# Patient Record
Sex: Female | Born: 1950
Health system: Southern US, Community
[De-identification: ages and names within clinical notes are randomized; demographics above are authoritative.]

## PROBLEM LIST (undated history)

## (undated) DIAGNOSIS — L259 Unspecified contact dermatitis, unspecified cause: Secondary | ICD-10-CM

## (undated) DIAGNOSIS — M1990 Unspecified osteoarthritis, unspecified site: Secondary | ICD-10-CM

## (undated) DIAGNOSIS — M479 Spondylosis, unspecified: Secondary | ICD-10-CM

## (undated) DIAGNOSIS — G589 Mononeuropathy, unspecified: Secondary | ICD-10-CM

## (undated) DIAGNOSIS — E785 Hyperlipidemia, unspecified: Secondary | ICD-10-CM

## (undated) DIAGNOSIS — S149XXA Injury of unspecified nerves of neck, initial encounter: Secondary | ICD-10-CM

## (undated) DIAGNOSIS — Z8639 Personal history of other endocrine, nutritional and metabolic disease: Secondary | ICD-10-CM

## (undated) DIAGNOSIS — Z862 Personal history of diseases of the blood and blood-forming organs and certain disorders involving the immune mechanism: Secondary | ICD-10-CM

## (undated) DIAGNOSIS — A630 Anogenital (venereal) warts: Secondary | ICD-10-CM

## (undated) DIAGNOSIS — I10 Essential (primary) hypertension: Secondary | ICD-10-CM

## (undated) DIAGNOSIS — K219 Gastro-esophageal reflux disease without esophagitis: Secondary | ICD-10-CM

## (undated) DIAGNOSIS — M5030 Other cervical disc degeneration, unspecified cervical region: Secondary | ICD-10-CM

## (undated) DIAGNOSIS — S83429A Sprain of lateral collateral ligament of unspecified knee, initial encounter: Secondary | ICD-10-CM

## (undated) DIAGNOSIS — M5020 Other cervical disc displacement, unspecified cervical region: Secondary | ICD-10-CM

## (undated) HISTORY — DX: Anogenital (venereal) warts: A63.0

## (undated) HISTORY — DX: Personal history of other endocrine, nutritional and metabolic disease: Z86.39

## (undated) HISTORY — DX: Spondylosis, unspecified: M47.9

## (undated) HISTORY — DX: Other cervical disc displacement, unspecified cervical region: M50.20

## (undated) HISTORY — DX: Gastro-esophageal reflux disease without esophagitis: K21.9

## (undated) HISTORY — DX: Other cervical disc degeneration, unspecified cervical region: M50.30

## (undated) HISTORY — DX: Personal history of diseases of the blood and blood-forming organs and certain disorders involving the immune mechanism: Z86.2

## (undated) HISTORY — DX: Essential (primary) hypertension: I10

## (undated) HISTORY — DX: Injury of unspecified nerves of neck, initial encounter: S14.9XXA

## (undated) HISTORY — DX: Personal history of diseases of the blood and blood-forming organs and certain disorders involving the immune mechanism: Z86.39

## (undated) HISTORY — DX: Unspecified contact dermatitis, unspecified cause: L25.9

## (undated) HISTORY — PX: OTHER SURGICAL HISTORY: SHX169

## (undated) HISTORY — DX: Unspecified osteoarthritis, unspecified site: M19.90

## (undated) HISTORY — DX: Hyperlipidemia, unspecified: E78.5

## (undated) HISTORY — DX: Mononeuropathy, unspecified: G58.9

## (undated) HISTORY — DX: Sprain of lateral collateral ligament of unspecified knee, initial encounter: S83.429A

---

## 1990-01-10 HISTORY — PX: CHOLECYSTECTOMY: SHX55

## 1997-12-25 ENCOUNTER — Emergency Department (HOSPITAL_COMMUNITY): Admission: EM | Admit: 1997-12-25 | Discharge: 1997-12-25 | Payer: Self-pay | Admitting: Emergency Medicine

## 1999-01-07 ENCOUNTER — Other Ambulatory Visit: Admission: RE | Admit: 1999-01-07 | Discharge: 1999-01-07 | Payer: Self-pay | Admitting: *Deleted

## 1999-01-09 ENCOUNTER — Inpatient Hospital Stay (HOSPITAL_COMMUNITY): Admission: AD | Admit: 1999-01-09 | Discharge: 1999-01-09 | Payer: Self-pay | Admitting: *Deleted

## 1999-01-11 HISTORY — PX: MOHS SURGERY: SHX181

## 1999-08-05 ENCOUNTER — Emergency Department (HOSPITAL_COMMUNITY): Admission: EM | Admit: 1999-08-05 | Discharge: 1999-08-06 | Payer: Self-pay | Admitting: Emergency Medicine

## 1999-09-22 ENCOUNTER — Encounter: Admission: RE | Admit: 1999-09-22 | Discharge: 1999-09-22 | Payer: Self-pay | Admitting: Internal Medicine

## 1999-09-22 ENCOUNTER — Encounter: Payer: Self-pay | Admitting: Internal Medicine

## 1999-12-22 ENCOUNTER — Encounter (INDEPENDENT_AMBULATORY_CARE_PROVIDER_SITE_OTHER): Payer: Self-pay

## 1999-12-22 ENCOUNTER — Other Ambulatory Visit: Admission: RE | Admit: 1999-12-22 | Discharge: 1999-12-22 | Payer: Self-pay | Admitting: *Deleted

## 2000-01-07 ENCOUNTER — Other Ambulatory Visit: Admission: RE | Admit: 2000-01-07 | Discharge: 2000-01-07 | Payer: Self-pay | Admitting: *Deleted

## 2000-09-26 ENCOUNTER — Encounter: Payer: Self-pay | Admitting: Internal Medicine

## 2000-09-26 ENCOUNTER — Encounter: Admission: RE | Admit: 2000-09-26 | Discharge: 2000-09-26 | Payer: Self-pay | Admitting: Internal Medicine

## 2004-01-11 HISTORY — PX: FOOT FRACTURE SURGERY: SHX645

## 2004-10-28 ENCOUNTER — Ambulatory Visit: Payer: Self-pay | Admitting: Internal Medicine

## 2004-10-28 ENCOUNTER — Ambulatory Visit (HOSPITAL_COMMUNITY): Admission: RE | Admit: 2004-10-28 | Discharge: 2004-10-28 | Payer: Self-pay | Admitting: Internal Medicine

## 2005-06-16 ENCOUNTER — Ambulatory Visit: Payer: Self-pay | Admitting: Family Medicine

## 2005-06-16 ENCOUNTER — Ambulatory Visit: Payer: Self-pay | Admitting: *Deleted

## 2005-06-30 ENCOUNTER — Ambulatory Visit (HOSPITAL_COMMUNITY): Admission: RE | Admit: 2005-06-30 | Discharge: 2005-06-30 | Payer: Self-pay | Admitting: Family Medicine

## 2005-07-15 ENCOUNTER — Ambulatory Visit: Payer: Self-pay | Admitting: Family Medicine

## 2005-07-15 ENCOUNTER — Encounter (INDEPENDENT_AMBULATORY_CARE_PROVIDER_SITE_OTHER): Payer: Self-pay | Admitting: Family Medicine

## 2005-07-19 ENCOUNTER — Ambulatory Visit (HOSPITAL_COMMUNITY): Admission: RE | Admit: 2005-07-19 | Discharge: 2005-07-19 | Payer: Self-pay | Admitting: Family Medicine

## 2005-09-01 ENCOUNTER — Emergency Department (HOSPITAL_COMMUNITY): Admission: EM | Admit: 2005-09-01 | Discharge: 2005-09-01 | Payer: Self-pay | Admitting: Emergency Medicine

## 2005-09-20 ENCOUNTER — Ambulatory Visit: Payer: Self-pay | Admitting: Family Medicine

## 2005-09-22 ENCOUNTER — Ambulatory Visit: Payer: Self-pay | Admitting: Family Medicine

## 2005-09-28 ENCOUNTER — Ambulatory Visit (HOSPITAL_COMMUNITY): Admission: RE | Admit: 2005-09-28 | Discharge: 2005-09-28 | Payer: Self-pay | Admitting: Family Medicine

## 2005-10-25 ENCOUNTER — Ambulatory Visit: Payer: Self-pay | Admitting: Family Medicine

## 2005-11-01 ENCOUNTER — Ambulatory Visit: Payer: Self-pay | Admitting: Family Medicine

## 2006-08-25 ENCOUNTER — Ambulatory Visit (HOSPITAL_COMMUNITY): Admission: RE | Admit: 2006-08-25 | Discharge: 2006-08-25 | Payer: Self-pay | Admitting: Family Medicine

## 2006-09-27 ENCOUNTER — Encounter (INDEPENDENT_AMBULATORY_CARE_PROVIDER_SITE_OTHER): Payer: Self-pay | Admitting: *Deleted

## 2006-10-24 ENCOUNTER — Encounter (INDEPENDENT_AMBULATORY_CARE_PROVIDER_SITE_OTHER): Payer: Self-pay | Admitting: Family Medicine

## 2006-10-24 ENCOUNTER — Ambulatory Visit: Payer: Self-pay | Admitting: Family Medicine

## 2007-08-27 ENCOUNTER — Ambulatory Visit (HOSPITAL_COMMUNITY): Admission: RE | Admit: 2007-08-27 | Discharge: 2007-08-27 | Payer: Self-pay | Admitting: Family Medicine

## 2008-04-01 ENCOUNTER — Ambulatory Visit: Payer: Self-pay | Admitting: Gastroenterology

## 2008-04-04 ENCOUNTER — Encounter: Payer: Self-pay | Admitting: Internal Medicine

## 2008-04-04 DIAGNOSIS — K219 Gastro-esophageal reflux disease without esophagitis: Secondary | ICD-10-CM

## 2008-04-04 DIAGNOSIS — A630 Anogenital (venereal) warts: Secondary | ICD-10-CM

## 2008-04-04 DIAGNOSIS — C4492 Squamous cell carcinoma of skin, unspecified: Secondary | ICD-10-CM | POA: Insufficient documentation

## 2008-04-04 DIAGNOSIS — R7302 Impaired glucose tolerance (oral): Secondary | ICD-10-CM

## 2008-04-04 DIAGNOSIS — M479 Spondylosis, unspecified: Secondary | ICD-10-CM | POA: Insufficient documentation

## 2008-04-04 DIAGNOSIS — Z87898 Personal history of other specified conditions: Secondary | ICD-10-CM

## 2008-04-16 ENCOUNTER — Ambulatory Visit: Payer: Self-pay | Admitting: Gastroenterology

## 2008-05-01 ENCOUNTER — Encounter: Payer: Self-pay | Admitting: Internal Medicine

## 2008-05-19 ENCOUNTER — Ambulatory Visit: Payer: Self-pay | Admitting: Internal Medicine

## 2008-05-19 DIAGNOSIS — E785 Hyperlipidemia, unspecified: Secondary | ICD-10-CM | POA: Insufficient documentation

## 2008-05-19 DIAGNOSIS — Z9189 Other specified personal risk factors, not elsewhere classified: Secondary | ICD-10-CM | POA: Insufficient documentation

## 2008-05-19 DIAGNOSIS — I10 Essential (primary) hypertension: Secondary | ICD-10-CM

## 2008-05-28 IMAGING — CR DG HAND COMPLETE 3+V*L*
3 series · 3 of 3 positions shown · non-contrast
Comparison: none

CLINICAL DATA: 54-year-old female, left hand pain and palpable nodule over the left 2nd and 3rd metacarpals. 
 LEFT HAND ? 3 VIEW:

[view not recorded (1 of 3)]
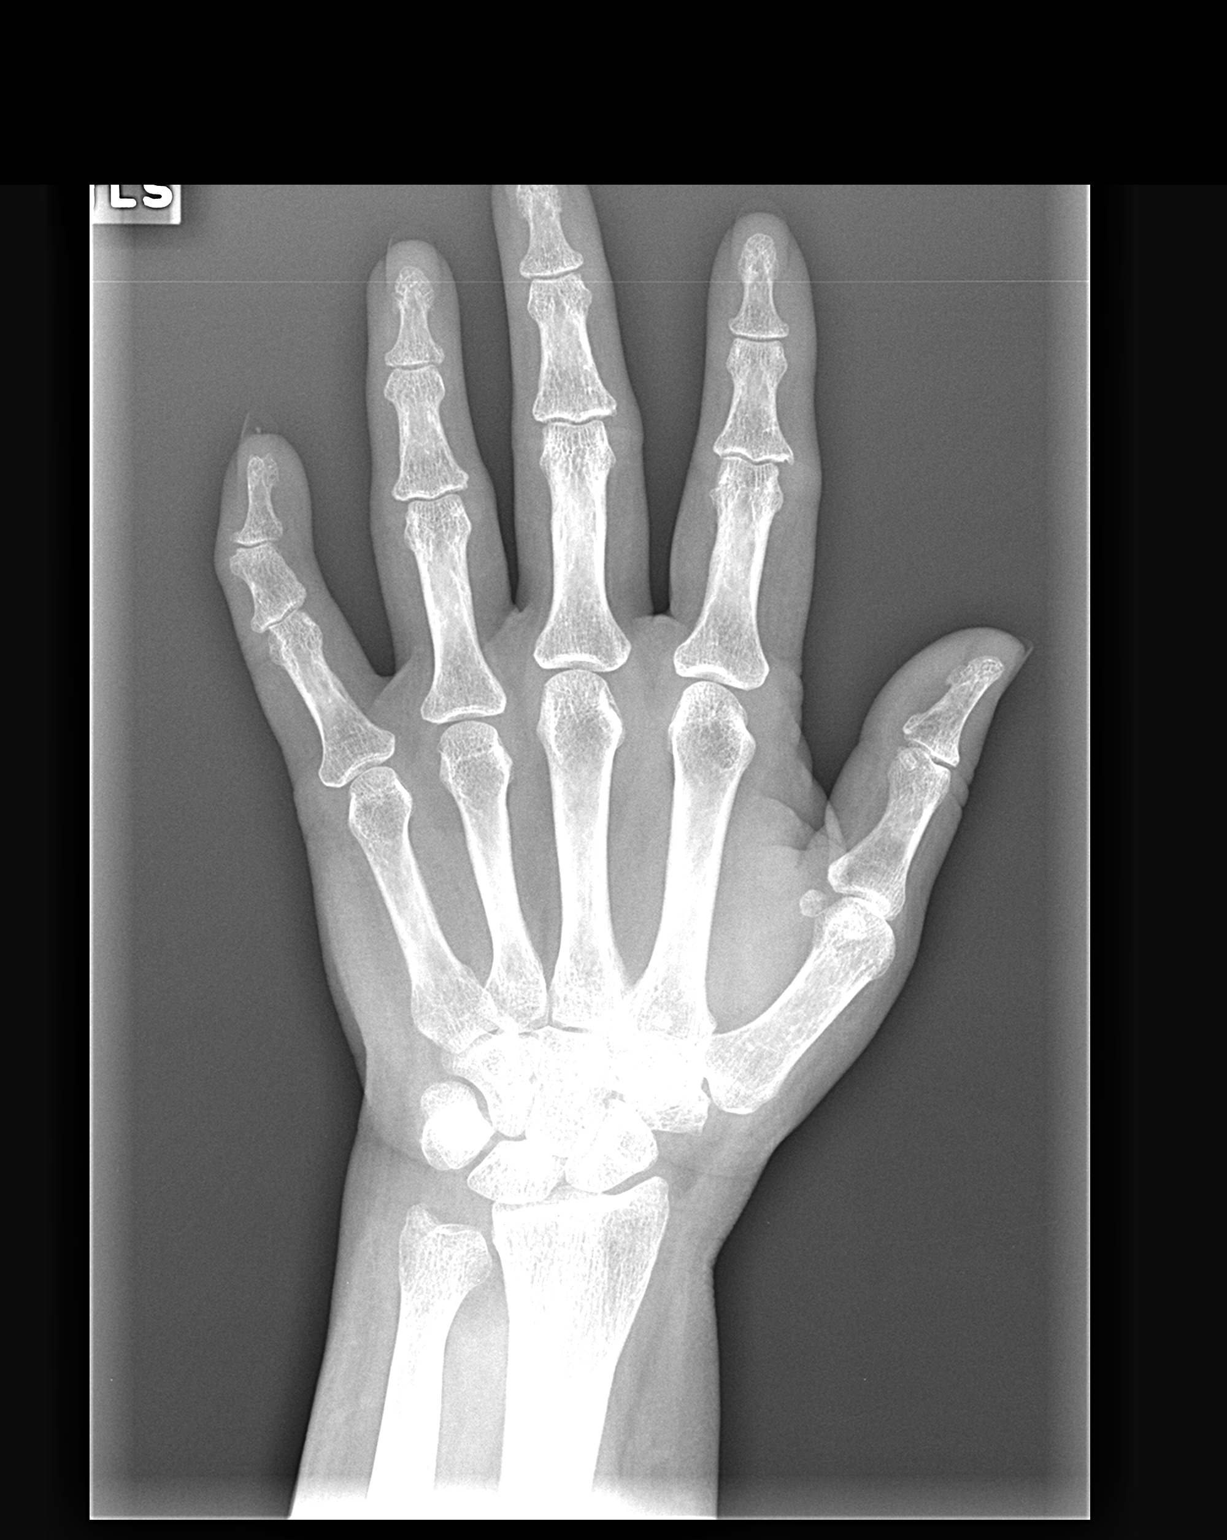

[view not recorded (2 of 3)]
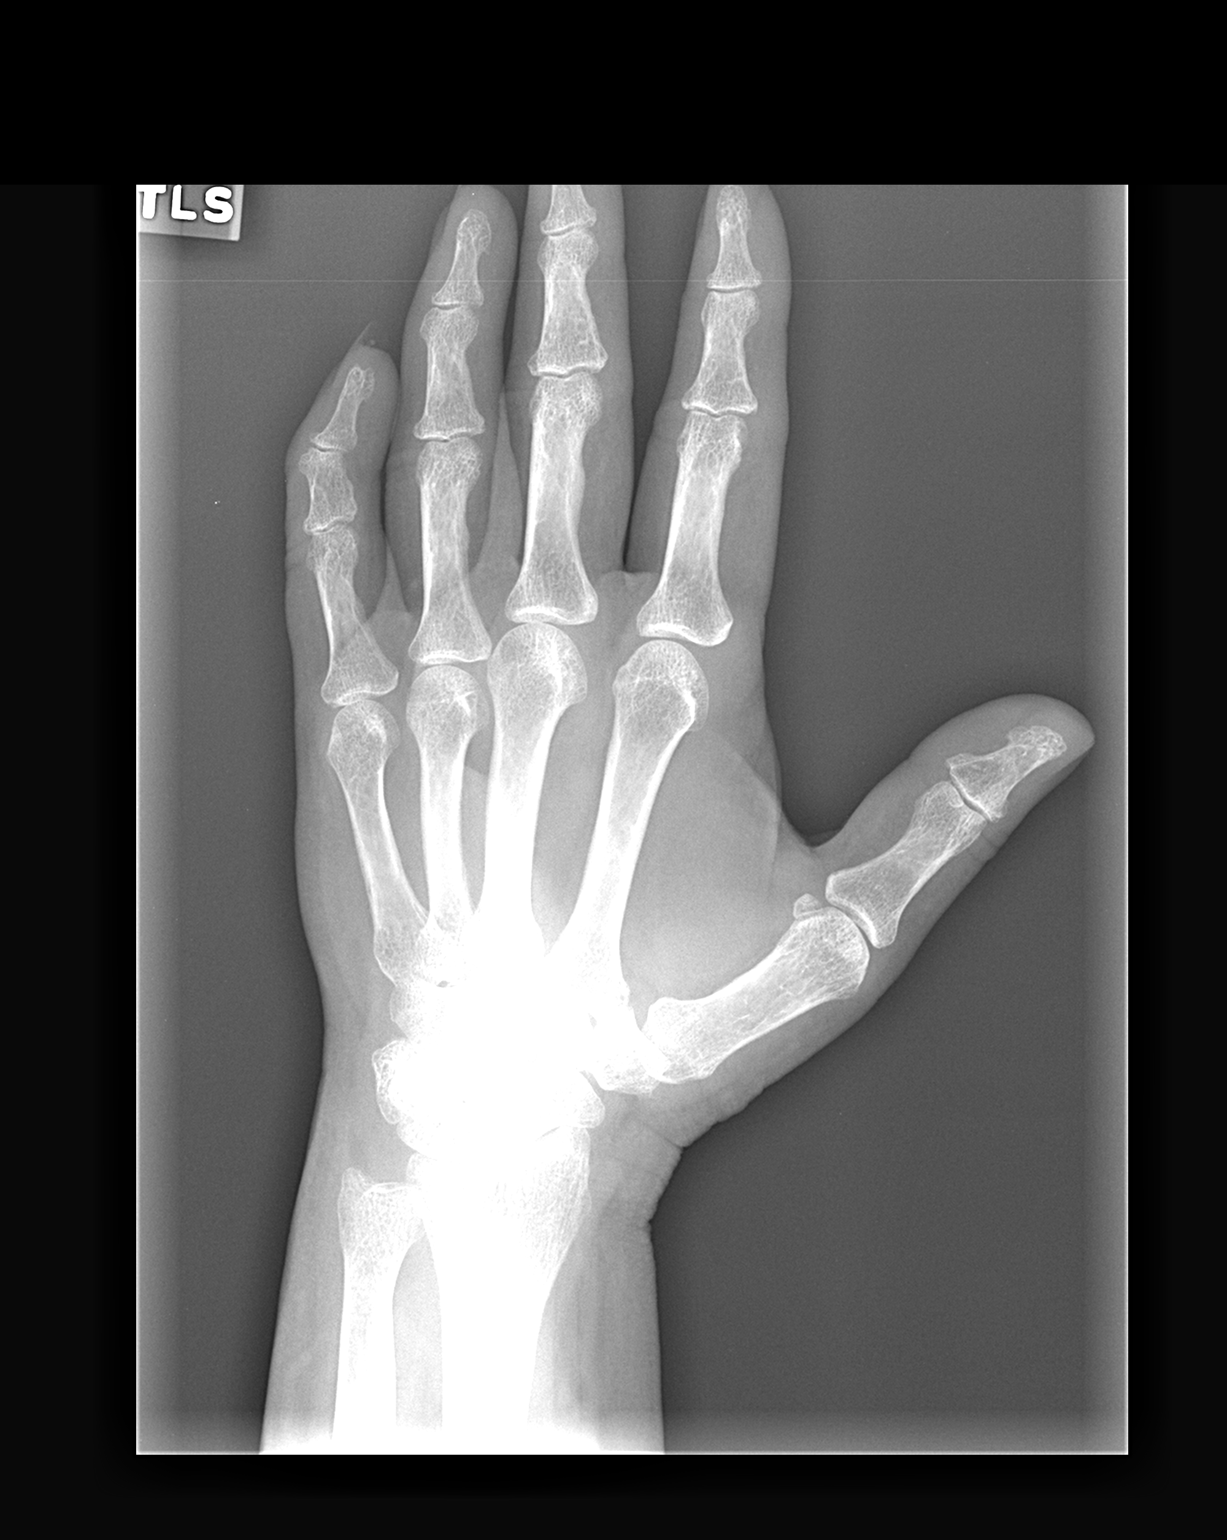

[view not recorded (3 of 3)]
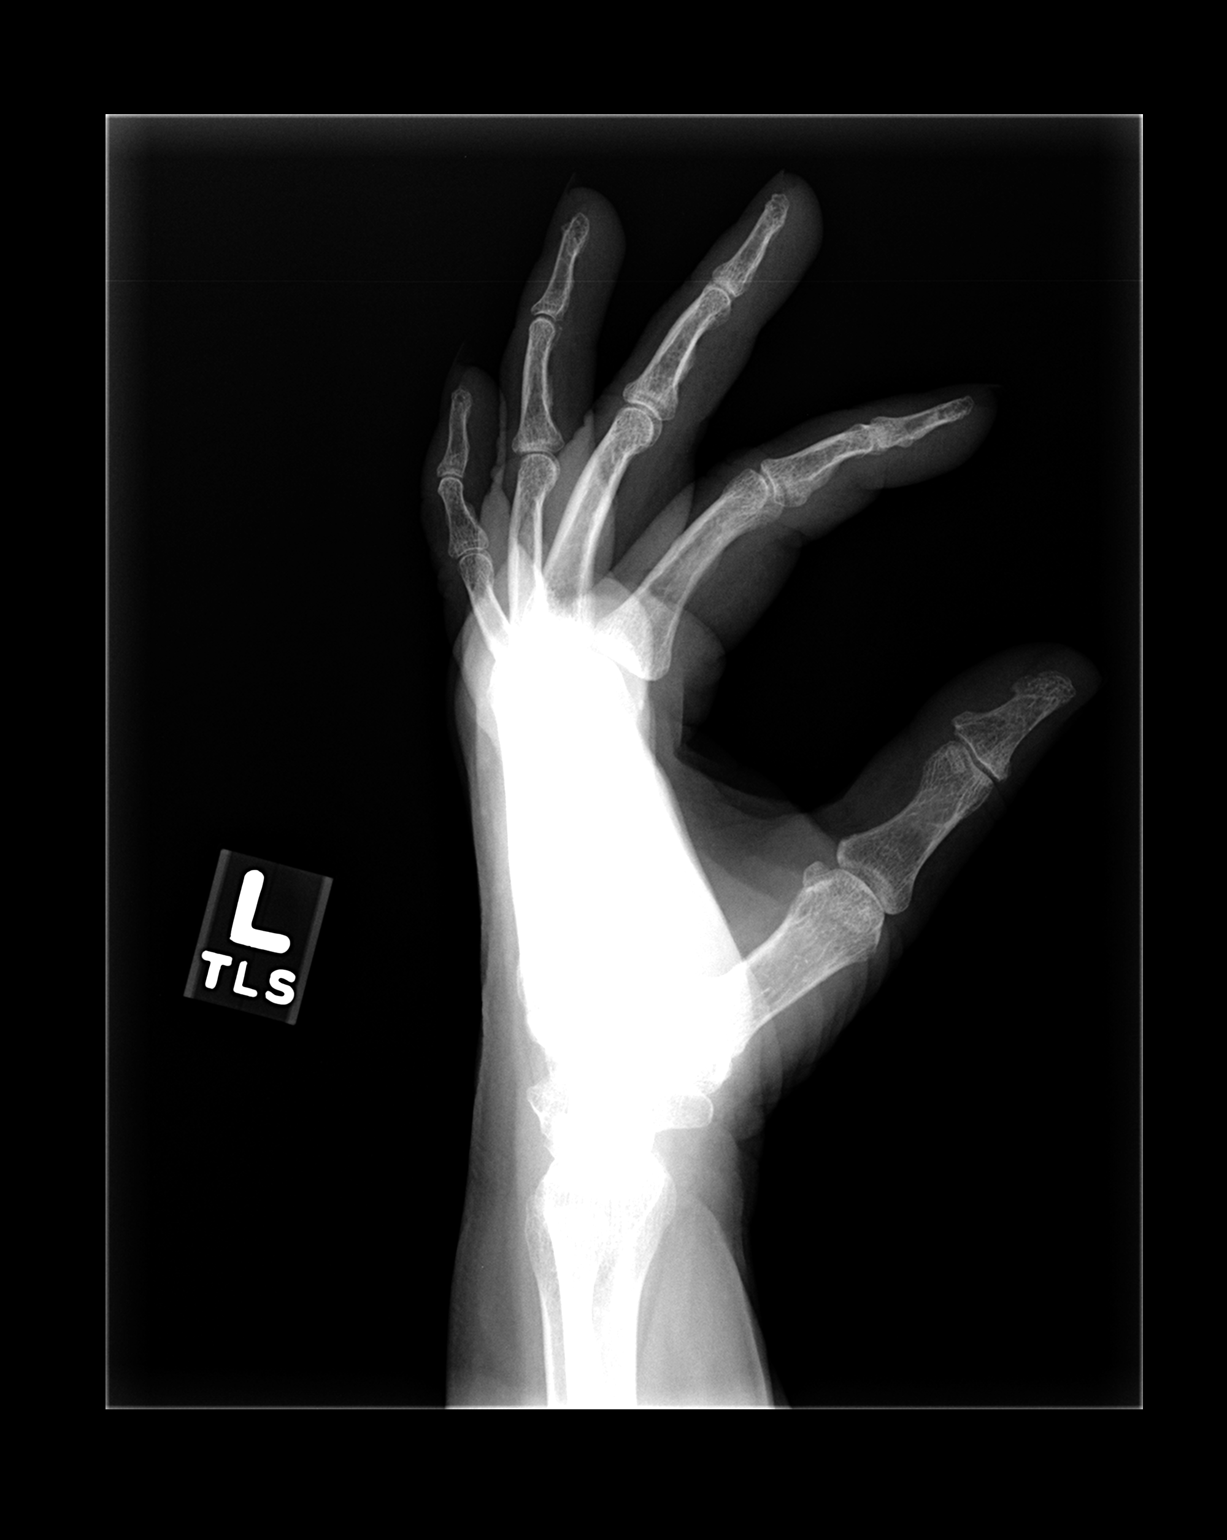

[3 of 3 positions shown; findings below may reference images not displayed]

FINDINGS: Mild osteoarthritis of the DIP and PIP joints with joint space narrowing and marginal bony spurring/osteophytes.  No significant MCP joint abnormality or soft tissue calcification.  No radiopaque foreign body.  No underlying fracture.
IMPRESSION: 1.  No acute fracture.  
 2.  Mild osteoarthritis of the DIP and PIP joints.

## 2008-06-16 ENCOUNTER — Ambulatory Visit: Payer: Self-pay | Admitting: Internal Medicine

## 2008-06-17 ENCOUNTER — Encounter (INDEPENDENT_AMBULATORY_CARE_PROVIDER_SITE_OTHER): Payer: Self-pay | Admitting: *Deleted

## 2008-06-20 ENCOUNTER — Telehealth: Payer: Self-pay | Admitting: Internal Medicine

## 2008-07-18 ENCOUNTER — Telehealth: Payer: Self-pay | Admitting: Internal Medicine

## 2008-07-21 ENCOUNTER — Encounter: Payer: Self-pay | Admitting: Internal Medicine

## 2008-07-28 ENCOUNTER — Emergency Department (HOSPITAL_COMMUNITY): Admission: EM | Admit: 2008-07-28 | Discharge: 2008-07-28 | Payer: Self-pay | Admitting: Emergency Medicine

## 2008-08-04 ENCOUNTER — Ambulatory Visit: Payer: Self-pay | Admitting: Internal Medicine

## 2008-08-27 ENCOUNTER — Ambulatory Visit (HOSPITAL_COMMUNITY): Admission: RE | Admit: 2008-08-27 | Discharge: 2008-08-27 | Payer: Self-pay | Admitting: Obstetrics & Gynecology

## 2008-09-25 ENCOUNTER — Ambulatory Visit: Payer: Self-pay | Admitting: Internal Medicine

## 2008-10-14 ENCOUNTER — Ambulatory Visit: Payer: Self-pay | Admitting: Gastroenterology

## 2008-10-15 ENCOUNTER — Ambulatory Visit: Payer: Self-pay | Admitting: Gastroenterology

## 2008-10-16 ENCOUNTER — Encounter: Payer: Self-pay | Admitting: Gastroenterology

## 2008-10-22 ENCOUNTER — Ambulatory Visit: Payer: Self-pay | Admitting: Internal Medicine

## 2008-11-12 ENCOUNTER — Ambulatory Visit: Payer: Self-pay | Admitting: Internal Medicine

## 2008-11-12 DIAGNOSIS — L259 Unspecified contact dermatitis, unspecified cause: Secondary | ICD-10-CM

## 2008-12-02 ENCOUNTER — Ambulatory Visit: Payer: Self-pay | Admitting: Gastroenterology

## 2008-12-05 ENCOUNTER — Ambulatory Visit: Payer: Self-pay | Admitting: Family Medicine

## 2008-12-05 DIAGNOSIS — L02619 Cutaneous abscess of unspecified foot: Secondary | ICD-10-CM | POA: Insufficient documentation

## 2008-12-05 DIAGNOSIS — L03039 Cellulitis of unspecified toe: Secondary | ICD-10-CM

## 2008-12-08 ENCOUNTER — Ambulatory Visit: Payer: Self-pay | Admitting: Internal Medicine

## 2008-12-08 DIAGNOSIS — L600 Ingrowing nail: Secondary | ICD-10-CM

## 2008-12-12 ENCOUNTER — Telehealth: Payer: Self-pay | Admitting: Gastroenterology

## 2008-12-16 ENCOUNTER — Ambulatory Visit: Payer: Self-pay | Admitting: Internal Medicine

## 2008-12-17 LAB — CONVERTED CEMR LAB: Total CHOL/HDL Ratio: 6

## 2008-12-24 ENCOUNTER — Telehealth: Payer: Self-pay | Admitting: Gastroenterology

## 2009-09-01 ENCOUNTER — Ambulatory Visit (HOSPITAL_COMMUNITY): Admission: RE | Admit: 2009-09-01 | Discharge: 2009-09-01 | Payer: Self-pay | Admitting: Obstetrics & Gynecology

## 2009-10-01 ENCOUNTER — Ambulatory Visit: Payer: Self-pay | Admitting: Internal Medicine

## 2009-10-01 ENCOUNTER — Encounter (INDEPENDENT_AMBULATORY_CARE_PROVIDER_SITE_OTHER): Payer: Self-pay | Admitting: *Deleted

## 2009-11-26 ENCOUNTER — Encounter: Payer: Self-pay | Admitting: Internal Medicine

## 2009-12-29 ENCOUNTER — Ambulatory Visit: Payer: Self-pay | Admitting: Internal Medicine

## 2010-01-13 ENCOUNTER — Encounter: Admit: 2010-01-13 | Payer: Self-pay | Admitting: Internal Medicine

## 2010-02-04 ENCOUNTER — Telehealth: Payer: Self-pay | Admitting: Internal Medicine

## 2010-02-07 LAB — CONVERTED CEMR LAB
ALT: 13 units/L (ref 0–35)
BUN: 9 mg/dL (ref 6–23)
Bilirubin, Direct: 0.1 mg/dL (ref 0.0–0.3)
Chloride: 108 meq/L (ref 96–112)
Cholesterol: 232 mg/dL — ABNORMAL HIGH (ref 0–200)
Direct LDL: 172.6 mg/dL
GFR calc non Af Amer: 110.08 mL/min (ref >60)
Glucose, Bld: 93 mg/dL (ref 70–99)
HCT: 40.7 % (ref 36.0–46.0)
HDL: 41.5 mg/dL (ref >39.00)
Hemoglobin, Urine: NEGATIVE
Lymphocytes Relative: 39.2 % (ref 12.0–46.0)
Monocytes Absolute: 0.4 10*3/uL (ref 0.1–1.0)
Neutrophils Relative %: 51.8 % (ref 43.0–77.0)
Potassium: 4.1 meq/L (ref 3.5–5.1)
Specific Gravity, Urine: 1.015 (ref 1.000–1.030)
TSH: 1.04 microintl units/mL (ref 0.35–5.50)
Total Bilirubin: 0.7 mg/dL (ref 0.3–1.2)
Total Protein, Urine: NEGATIVE mg/dL
Total Protein: 7.6 g/dL (ref 6.0–8.3)
Triglycerides: 108 mg/dL (ref 0.0–149.0)
Urine Glucose: NEGATIVE mg/dL
VLDL: 21.6 mg/dL (ref 0.0–40.0)
Vit D, 25-Hydroxy: 38 ng/mL (ref 30–89)
WBC: 5.7 10*3/uL (ref 4.5–10.5)
pH: 7 (ref 5.0–8.0)

## 2010-02-09 NOTE — Letter (Signed)
Summary: Research Study Participant Alert/[employer] Study Participant Alert/[employer] [facility] [place]   Imported By: Sherian Rein 12/09/2009 14:02:00  _____________________________________________________________________  External Attachment:    Type:   Image     Comment:   External Document

## 2010-02-09 NOTE — Assessment & Plan Note (Signed)
Summary: REFILLS/ SEVERAL ISSUES /NWS  #   Vital Signs:  Patient profile:   60 year old female Height:      61 inches (154.94 cm) Weight:      178.4 pounds (81.09 kg) BMI:     33.83 O2 Sat:      98 % on Room air Temp:     97.6 degrees F (36.44 degrees C) oral Pulse rate:   94 / minute BP sitting:   104 / 72  (left arm) Cuff size:   large  Vitals Entered By: Orlan Leavens RMA (October 01, 2009 10:21 AM)  O2 Flow:  Room air CC: Discuss several issues Is Patient Diabetic? No Pain Assessment Patient in pain? no      Comments Want refill on percocet   Primary Care Provider:  Newt Lukes MD  CC:  Discuss several issues.  History of Present Illness: neck pain - acute on chronic - symptoms >7yr - takes flexeril daily for same - acute increase in pain 2 weeks ago after lifting activity at the gym - numbness down both arms -  pain with rotation of neck - currenyl, unrelieved by flexeril - given percocet for flare last year -  also had MRI c-spine done last year showing "bulge" - copy/report not available at this time-  GERD - changed to nexium from dexalant due to dexilant "raising BP" - BP better and reflux symptoms stil intermitt and dependant on food type choices  HTN - best BP in AMs- no adv tx effects - trying to watch salt intake requests change to generic for cost savings  dyslipidemia - weight gain 2010 summer with adv effect on same- now s/p intentional wt loss - never on statin - rec lipitor prev but did not start due to family adv se on lipitor  Clinical Review Panels:  Lipid Management   Cholesterol:  239 (12/16/2008)   HDL (good cholesterol):  40.70 (12/16/2008)  CBC   WBC:  5.7 (06/16/2008)   RBC:  4.44 (06/16/2008)   Hgb:  14.2 (06/16/2008)   Hct:  40.7 (06/16/2008)   Platelets:  307.0 (06/16/2008)   MCV  91.6 (06/16/2008)   MCHC  34.8 (06/16/2008)   RDW  12.5 (06/16/2008)   PMN:  51.8 (06/16/2008)   Lymphs:  39.2 (06/16/2008)  Monos:  6.3 (06/16/2008)   Eosinophils:  1.8 (06/16/2008)   Basophil:  0.9 (06/16/2008)  Complete Metabolic Panel   Glucose:  93 (06/16/2008)   Sodium:  138 (06/16/2008)   Potassium:  4.1 (06/16/2008)   Chloride:  108 (06/16/2008)   CO2:  26 (06/16/2008)   BUN:  9 (06/16/2008)   Creatinine:  0.6 (06/16/2008)   Albumin:  4.0 (06/16/2008)   Total Protein:  7.6 (06/16/2008)   Calcium:  9.3 (06/16/2008)   Total Bili:  0.7 (06/16/2008)   Alk Phos:  85 (06/16/2008)   SGPT (ALT):  13 (06/16/2008)   SGOT (AST):  16 (06/16/2008)   Current Medications (verified): 1)  Cyclobenzaprine Hcl 10 Mg Tabs (Cyclobenzaprine Hcl) .... Take One (1) Tablet By Mouth Two (2)  Times Daily or At Bedtime As Needed For Spasms 2)  Benicar Hct 20-12.5 Mg Tabs (Olmesartan Medoxomil-Hctz) .Marland Kitchen.. 1 By Mouth Once Daily 3)  Triamcinolone Acetonide 0.1 % Oint (Triamcinolone Acetonide) .... Apply To Rash On Body Two Times A Day - Three Times A Day As Needed 4)  Percocet 5-325 Mg Tabs (Oxycodone-Acetaminophen) .Marland Kitchen.. 1 Tab Every 6 Hours As Needed For Pain.  Allergies (verified):  1)  ! * Phentermine 2)  * Ergostat 3)  * E-Mycin 4)  Verapamil 5)  * Betachron 6)  Prilosec 7)  Doxycycline  Past History:  Past Medical History: GERD Squamous cell carcinoma: right temple area Hypertension osteopenia -DEXA 6/10 at gyn Migraines hx - Genital warts Hyperlipidemia Obesity cervical neck pain-onset 07/1988  Past Surgical History: Cholecystectomy (1992) Skin cancer (2001) Broke left toe (2006)   Review of Systems  The patient denies fever, chest pain, syncope, dyspnea on exertion, peripheral edema, headaches, muscle weakness, and difficulty walking.    Physical Exam  General:  alert, well-developed, well-nourished, and cooperative to examination.    Lungs:  normal respiratory effort, no intercostal retractions or use of accessory muscles; normal breath sounds bilaterally - no crackles and no wheezes.    Heart:   normal rate, regular rhythm, no murmur, and no rub. BLE without edema.  Msk:  +myofascial pain and spam along paraspinal region c-spine - FROM but limited by pain on B rotation - neg romberg and good balance - gait normal - good strength BUE   Impression & Recommendations:  Problem # 1:  DEGENERATIVE JOINT DISEASE, CERVICAL SPINE (ICD-721.90)  acute flare of pain symptoms 2 weeks ago - tx pred pack and change muscle relaxant - problem without neuro deficits refer to ortho spine given chronicity of problems  Orders: Orthopedic Surgeon Referral (Ortho Surgeon) Prescription Created Electronically [phone])  Problem # 2:  HYPERTENSION (ICD-401.9)  change benicar to generic arb - erx done Her updated medication list for this problem includes:    Losartan Potassium 50 Mg Tabs (Losartan potassium) .Marland Kitchen... 1 by mouth once daily  Orders: Prescription Created Electronically [phone])  BP today: 104/72 Prior BP: 118/78 (12/16/2008)  Labs Reviewed: K+: 4.1 (06/16/2008) Creat: : 0.6 (06/16/2008)   Chol: 239 (12/16/2008)   HDL: 40.70 (12/16/2008)   TG: 163.0 (12/16/2008)  Problem # 3:  GERD (ICD-530.81)  The following medications were removed from the medication list:    Nexium 40 Mg Cpdr (Esomeprazole magnesium) .Marland Kitchen... Take 1 by mouth once daily  EGD: Location: [facility]   (10/15/2008)  Labs Reviewed: Hgb: 14.2 (06/16/2008)   Hct: 40.7 (06/16/2008)  Problem # 4:  DYSLIPIDEMIA (ICD-272.4)  statin never begun due to patient preference (nephew had adv se)  Labs Reviewed: SGOT: 16 (06/16/2008)   SGPT: 13 (06/16/2008)   HDL:40.70 (12/16/2008), 41.50 (06/16/2008)  Chol:239 (12/16/2008), 232 (06/16/2008)  Trig:163.0 (12/16/2008), 108.0 (06/16/2008)  Complete Medication List: 1)  Cyclobenzaprine Hcl 10 Mg Tabs (Cyclobenzaprine hcl) .... Take one (1) tablet by mouth two (2)  times daily or at bedtime as needed for spasms 2)  Losartan Potassium 50 Mg Tabs (Losartan potassium)  .Marland Kitchen.. 1 by mouth once daily 3)  Triamcinolone Acetonide 0.1 % Oint (Triamcinolone acetonide) .... Apply to rash on body two times a day - three times a day as needed 4)  Hydrocodone-acetaminophen 5-500 Mg Tabs (Hydrocodone-acetaminophen) .Marland Kitchen.. 1 by mouth every 6 hours as needed for severe pain 5)  Prednisone (pak) 10 Mg Tabs (Prednisone) .... As directed x 6 days 6)  Methocarbamol 750 Mg Tabs (Methocarbamol) .Marland Kitchen.. 1 by mouth three times a day as needed for muscle spasm pain  Patient Instructions: 1)  it was good to see you today. 2)  medical note to give to gym as discussed 3)  try methacarbamol in place of flexeril for muscle spasm 4)  take pred pak for next 6 days for disc irritation and pain 5)  use hydrocodone  sparingly as needed for severe pain symptoms  6)  change benicar to losartan for blood pressure 7)  your prescriptions have been electronically submitted to your pharmacy or given to you to submit. Please take as directed. Contact our office if you believe you're having problems with the medication(s).  8)  we'll make referral to spine specialist for your neck pain. Our office will contact you regarding this appointment once made.  9)  Please schedule a follow-up appointment in 3-4 months to review blood pressure and other problems, call sooner if new problems.  Prescriptions: LOSARTAN POTASSIUM 50 MG TABS (LOSARTAN POTASSIUM) 1 by mouth once daily  #30 x 3   Entered and Authorized by:   Newt Lukes MD   Signed by:   Newt Lukes MD on 10/01/2009   Method used:   Electronically to        [facility] [address] (retail)       [phone]       [place], [place]  [address]       Ph: [phone]       Fax: [phone]   RxID:   [id number] HYDROCODONE-ACETAMINOPHEN 5-500 MG TABS (HYDROCODONE-ACETAMINOPHEN) 1 by mouth every 6 hours as needed for severe pain  #30 x 1   Entered and Authorized by:   Newt Lukes MD   Signed by:    Newt Lukes MD on 10/01/2009   Method used:   Print then Give to Patient   RxID:   [id number] METHOCARBAMOL 750 MG TABS (METHOCARBAMOL) 1 by mouth three times a day as needed for muscle spasm pain  #40 x 1   Entered and Authorized by:   Newt Lukes MD   Signed by:   Newt Lukes MD on 10/01/2009   Method used:   Electronically to        [facility] [address] (retail)       [phone]       [place], [place]  [address]       Ph: [phone]       Fax: [phone]   RxID:   [id number] PREDNISONE (PAK) 10 MG TABS (PREDNISONE) as directed x 6 days  #1 x 0   Entered and Authorized by:   Newt Lukes MD   Signed by:   Newt Lukes MD on 10/01/2009   Method used:   Electronically to        [facility] [address] (retail)       [phone]       [place], [place]  [address]       Ph: [phone]       Fax: [phone]   RxID:   [id number]

## 2010-02-09 NOTE — Letter (Signed)
Summary: Generic Letter  [facility]  [address] [place], [place] [address]   Phone: [phone]  Fax: [phone]    10/01/2009  [facility] [address] [place], [place]  [address]  To whom it may concern,  My patient, Colleen Walsh, due to her health conditions is not to participate in any exercise activites at the gym.     Sincerely,   Dr. Rene Paci

## 2010-02-11 NOTE — Assessment & Plan Note (Signed)
Summary: FU---DISCUSS MEDS---STC   Vital Signs:  Patient profile:   60 year old female Height:      61 inches (154.94 cm) Weight:      183.12 pounds (83.24 kg) O2 Sat:      99 % on Room air Temp:     98.1 degrees F (36.72 degrees C) oral Pulse rate:   82 / minute BP sitting:   120 / 82  (left arm) Cuff size:   large  Vitals Entered By: Orlan Leavens RMA (December 28, 2009 1:26 PM)  O2 Flow:  Room air CC: Discuss meds Is Patient Diabetic? No   Primary Care Provider:  Newt Lukes MD  CC:  Discuss meds.  History of Present Illness: neck pain - acute on chronic - symptoms >26yr - takes flexeril daily for same - numbness down both arms -  pain with rotation of neck - currently, unrelieved by robaxin - would liek to resume flexeril also had MRI c-spine done last year showing "bulge" at c4-5-  saw ortho spine [place] fall 2011 - "nonoperable"  GERD - changed to nexium from dexalant due to dexilant "raising BP" - BP better and reflux symptoms stil intermitt and dependant on food type choices  HTN - best BP in AMs- no adv tx effects - trying to watch salt intake requests change to generic for cost savings  dyslipidemia - weight gain 2010 summer with adv effect on same- now s/p intentional wt loss - never on statin - rec lipitor prev but did not start due to family adv se on lipitor enrolled with clinical trial 11/2009 in [place] - tol well thus far - planning 6 mo study  Current Medications (verified): 1)  Losartan Potassium 50 Mg Tabs (Losartan Potassium) .Marland Kitchen.. 1 By Mouth Once Daily 2)  Triamcinolone Acetonide 0.1 % Oint (Triamcinolone Acetonide) .... Apply To Rash On Body Two Times A Day - Three Times A Day As Needed 3)  Hydrocodone-Acetaminophen 5-500 Mg Tabs (Hydrocodone-Acetaminophen) .Marland Kitchen.. 1 By Mouth Every 6 Hours As Needed For Severe Pain  Allergies (verified): 1)  ! * Phentermine 2)  * Ergostat 3)  * E-Mycin 4)  Verapamil 5)  * Betachron 6)  Prilosec 7)   Doxycycline  Past History:  Past Medical History: GERD Squamous cell carcinoma: right temple area Hypertension osteopenia -DEXA 6/10 at gyn Migraines hx - Genital warts Hyperlipidemia Obesity cervical neck pain-onset 07/1988  Review of Systems  The patient denies fever, weight loss, chest pain, syncope, headaches, and muscle weakness.    Physical Exam  General:  alert, well-developed, well-nourished, and cooperative to examination.    Lungs:  normal respiratory effort, no intercostal retractions or use of accessory muscles; normal breath sounds bilaterally - no crackles and no wheezes.    Heart:  normal rate, regular rhythm, no murmur, and no rub. BLE without edema.  Msk:  +myofascial pain and spam along paraspinal region c-spine - FROM but limited by pain on B rotation - neg romberg and good balance - gait normal - good strength BUE   Impression & Recommendations:  Problem # 1:  DEGENERATIVE JOINT DISEASE, CERVICAL SPINE (ICD-721.90)  Orders: Prescription Created Electronically [phone]) Physical Therapy Referral (PT)  chronic symptoms - s/p ortho spine eval fall 2011 ([facility]) - "nothing to do" problem without neuro deficits change back to flexeril - add daily meloxicam and refer to PT  Problem # 2:  DYSLIPIDEMIA (ICD-272.4)  enrolled in study at [place] - 3 meds (unknown med) for same  defer mgmt same ot study directors - [facility] meds w/o adv problem  Labs Reviewed: SGOT: 16 (06/16/2008)   SGPT: 13 (06/16/2008)   HDL:40.70 (12/16/2008), 41.50 (06/16/2008)  Chol:239 (12/16/2008), 232 (06/16/2008)  Trig:163.0 (12/16/2008), 108.0 (06/16/2008)  Problem # 3:  HYPERTENSION (ICD-401.9)  Her updated medication list for this problem includes:    Losartan Potassium 50 Mg Tabs (Losartan potassium) .Marland Kitchen... 1 by mouth once daily  BP today: 120/82 Prior BP: 104/72 (10/01/2009)  Labs Reviewed: K+: 4.1 (06/16/2008) Creat: : 0.6 (06/16/2008)   Chol: 239 (12/16/2008)   HDL: 40.70  (12/16/2008)   TG: 163.0 (12/16/2008)  Problem # 4:  GERD (ICD-530.81)  EGD: Location: [facility]   (10/15/2008)  Labs Reviewed: Hgb: 14.2 (06/16/2008)   Hct: 40.7 (06/16/2008)  Complete Medication List: 1)  Losartan Potassium 50 Mg Tabs (Losartan potassium) .Marland Kitchen.. 1 by mouth once daily 2)  Triamcinolone Acetonide 0.1 % Oint (Triamcinolone acetonide) .... Apply to rash on body two times a day - three times a day as needed 3)  Hydrocodone-acetaminophen 5-500 Mg Tabs (Hydrocodone-acetaminophen) .Marland Kitchen.. 1 by mouth every 6 hours as needed for severe pain 4)  Flexeril 10 Mg Tabs (Cyclobenzaprine hcl) .Marland Kitchen.. 1 by mouth at bedtime as needed for muscle spasm and pain 5)  Meloxicam 15 Mg Tabs (Meloxicam) .Marland Kitchen.. 1 by mouth once daily as needed for pain  Patient Instructions: 1)  it was good to see you today. 2)  resume flexeril for muscle spasm and try meloxicam for pain symptoms - your prescriptions have been electronically submitted to your pharmacy. Please take as directed. Contact our office if you believe you're having problems with the medication(s).  3)  use hydrocodone sparingly as needed for severe pain symptoms  4)  continue losartan for blood pressure and study drugs for cholesterol 5)  we'll make referral to physical therapy. Our office will contact you regarding this appointment once made.  6)  Please schedule a follow-up appointment in 3-4 months to review blood pressure and other problems, call sooner if new problems.  Prescriptions: MELOXICAM 15 MG TABS (MELOXICAM) 1 by mouth once daily as needed for pain  #30 x 3   Entered and Authorized by:   Newt Lukes MD   Signed by:   Newt Lukes MD on 12/28/2009   Method used:   Electronically to        [facility] [address] (retail)       [phone]       [place], [place]  [address]       Ph: [phone]       Fax: [phone]   RxID:   [id number] FLEXERIL 10 MG TABS  (CYCLOBENZAPRINE HCL) 1 by mouth at bedtime as needed for muscle spasm and pain  #30 x 6   Entered and Authorized by:   Newt Lukes MD   Signed by:   Newt Lukes MD on 12/28/2009   Method used:   Electronically to        [facility] [address] (retail)       [phone]       [place], [place]  [address]       Ph: [phone]       Fax: [phone]   RxID:   [id number]    Orders Added: 1)  Est. Patient Level IV [83151] 2)  Prescription Created Electronically [phone]  3)  Physical Therapy Referral [PT]

## 2010-02-11 NOTE — Progress Notes (Signed)
Summary: pt call - update on meds  Phone Note Call from Patient Call back at Home Phone [phone]   Caller: Patient Summary of Call: Pt called to inform MD of medication she is taking at Cholesterol study: Lipitor 20mg  and Zetia 10mg  Initial call taken by: Margaret Pyle, CMA,  February 04, 2010 10:42 AM  Follow-up for Phone Call        EMR list updated - thanks Follow-up by: Newt Lukes MD,  February 04, 2010 12:13 PM    New/Updated Medications: LIPITOR 20 MG TABS (ATORVASTATIN CALCIUM) 1 by mouth once daily ZETIA 10 MG TABS (EZETIMIBE) 1 by mouth once daily

## 2010-06-01 ENCOUNTER — Encounter: Payer: Self-pay | Admitting: Internal Medicine

## 2010-06-14 ENCOUNTER — Ambulatory Visit (INDEPENDENT_AMBULATORY_CARE_PROVIDER_SITE_OTHER): Payer: Medicare Other | Admitting: Internal Medicine

## 2010-06-14 ENCOUNTER — Telehealth: Payer: Self-pay | Admitting: *Deleted

## 2010-06-14 ENCOUNTER — Encounter: Payer: Self-pay | Admitting: Internal Medicine

## 2010-06-14 DIAGNOSIS — E785 Hyperlipidemia, unspecified: Secondary | ICD-10-CM

## 2010-06-14 DIAGNOSIS — M479 Spondylosis, unspecified: Secondary | ICD-10-CM

## 2010-06-14 DIAGNOSIS — I10 Essential (primary) hypertension: Secondary | ICD-10-CM

## 2010-06-14 MED ORDER — LOSARTAN POTASSIUM 50 MG PO TABS: 50.0000 mg | ORAL_TABLET | Freq: Every day | ORAL | Status: DC

## 2010-06-14 MED ORDER — HYDROCODONE-ACETAMINOPHEN 5-500 MG PO TABS: 1.0000 | ORAL_TABLET | Freq: Four times a day (QID) | ORAL | Status: DC | PRN

## 2010-06-14 MED ORDER — ATORVASTATIN CALCIUM 10 MG PO TABS: 10.0000 mg | ORAL_TABLET | Freq: Every day | ORAL | Status: DC

## 2010-06-14 MED ORDER — CYCLOBENZAPRINE HCL 10 MG PO TABS: 10.0000 mg | ORAL_TABLET | Freq: Two times a day (BID) | ORAL | Status: DC | PRN

## 2010-06-14 NOTE — Progress Notes (Signed)
  Subjective:    Patient ID: Colleen Walsh, female    DOB: 28-Aug-1950, 60 y.o.   MRN: [id number]  HPI Here for 6 mo follow up - reviewed chronic medical issues today:  neck pain - acute on chronic -  symptoms >73yr - takes flexeril as needed for same, rare Vicodin when pain severe -  associated with numbness down both arms - also pain with rotation of neck -  Better with flexeril compared to robaxin saw ortho spine [place] fall 2011 - "nonoperable"  Request handicap placard signed due to same  GERD - changed to nexium from dexalant due to dexilant "raising BP" -  BP better and reflux symptoms improved - flares dependant on food type choices   HTN - best BP in AMs-  no adv tx effects - trying to watch salt intake - on generic for cost  dyslipidemia - reluctant to take statin but "i'll try if i need to" weight gain 2010 summer with adv effect on same-  Then intentional wt loss - but regained 20# in past 6 months never taken statin - rec lipitor and/or zetia prev but did not start due to family adv se on lipitor  enrolled with clinical trial 11/2009-04/2010 in [place] - no meds at this time   Past Medical History  Diagnosis Date  . VENEREAL WART   . CONTACT DERMATITIS&OTHER ECZEMA DUE UNSPEC CAUSE   . GLUCOSE INTOLERANCE, HX OF   . DEGENERATIVE JOINT DISEASE, CERVICAL SPINE   . HYPERTENSION   . DYSLIPIDEMIA   . GERD      Review of Systems  Constitutional: Positive for fatigue. Negative for fever.  Respiratory: Negative for shortness of breath.   Cardiovascular: Negative for chest pain.  Neurological: Negative for syncope.       Objective:   Physical Exam BP 142/88  Pulse 85  Temp(Src) 98.2 F (36.8 C) (Oral)  Resp 14  Wt 187 lb 4 oz (84.936 kg)  SpO2 99% Physical Exam  Constitutional: She is overweight; oriented to person, place, and time. She appears well-developed and well-nourished. No distress.  Cardiovascular: Normal rate, regular rhythm and normal heart  sounds.  No murmur heard. No BLE edema. Pulmonary/Chest: Effort normal and breath sounds normal. No respiratory distress. She has no wheezes.  Psychiatric: She has a normal mood and affect. Her behavior is normal. Judgment and thought content normal.        Lab Results  Component Value Date   WBC 5.7 06/16/2008   HGB 14.2 06/16/2008   HCT 40.7 06/16/2008   PLT 307.0 06/16/2008   CHOL 239* 12/16/2008   TRIG 163.0* 12/16/2008   HDL 40.70 12/16/2008   LDLDIRECT 164.7 12/16/2008   ALT 13 06/16/2008   AST 16 06/16/2008   NA 138 06/16/2008   K 4.1 06/16/2008   CL 108 06/16/2008   CREATININE 0.6 06/16/2008   BUN 9 06/16/2008   CO2 26 06/16/2008   TSH 1.04 06/16/2008    Assessment & Plan:  See problem list. Medications and labs reviewed today.

## 2010-06-14 NOTE — Assessment & Plan Note (Signed)
BP Readings from Last 3 Encounters:  06/14/10 142/88  12/28/09 120/82  10/01/09 104/72   Refill ARB and encouraged resumed efforts at weight control - up 20# since last OV

## 2010-06-14 NOTE — Assessment & Plan Note (Signed)
Noncompliant with statin and zetia or any other rx treatment - Previously in [place] study 11/2009-04/2010 - will resume low dose lipitor at this time To work on diet and exercise to control same

## 2010-06-14 NOTE — Patient Instructions (Signed)
It was good to see you today. Medications reviewed, resume low dose Lipitor at this time. Refill on medication(s) as discussed today. Your prescription(s) have been submitted to your pharmacy. Please take as directed and contact our office if you believe you are having problem(s) with the medication(s). Please schedule followup in 3-4 months to recheck cholesterol and liver, call sooner if problems. Work on lifestyle changes as discussed (low fat, low carb, increased protein diet; improved exercise efforts; weight loss) to control sugar, blood pressure and cholesterol levels and/or reduce risk of developing other medical problems. Look into [email or web address] or other type of food journal to assist you in this process.

## 2010-06-14 NOTE — Telephone Encounter (Signed)
Faxed RX for: Vicodin 5-500mg  tablet per VO Dr. Felicity Coyer

## 2010-06-15 NOTE — Assessment & Plan Note (Signed)
Chronic pain - uses prn vicodine and flexeril for same - ongoing since 1990s Last eval by ortho spine 2010 ([facility]): "non operable" - Cont same med mgmt but i declined to sign handicap tag for same - defer to specialist if felt necessary for pain control

## 2010-06-21 ENCOUNTER — Telehealth: Payer: Self-pay

## 2010-06-21 DIAGNOSIS — E785 Hyperlipidemia, unspecified: Secondary | ICD-10-CM

## 2010-06-21 MED ORDER — FENOFIBRATE 145 MG PO TABS: 145.0000 mg | ORAL_TABLET | Freq: Every day | ORAL | Status: DC

## 2010-06-21 NOTE — Telephone Encounter (Signed)
Pt called stating she cannot take recently prescribed cholesterol medication due to muscle pain and cramps, pt is requesting alternative medication

## 2010-06-21 NOTE — Telephone Encounter (Signed)
Stop lipitor (removed from med list) and start Tricor - erx done for same - Tricor is not statin and should be better tolerated - call if problems - thanks

## 2010-06-21 NOTE — Telephone Encounter (Signed)
Pt advised of Rx change and pharmacy 

## 2010-06-22 ENCOUNTER — Telehealth: Payer: Self-pay | Admitting: *Deleted

## 2010-06-22 NOTE — Telephone Encounter (Signed)
Change to generic fenofibrate, slightly lower dose than Tricor but otherwise same med - erx done - thanks

## 2010-06-22 NOTE — Telephone Encounter (Signed)
Pt states that she cannot afford Tricor rx, pt is requesting an alternative medication be sent in that she can tolerate.

## 2010-06-22 NOTE — Telephone Encounter (Signed)
Pt informed

## 2010-06-23 MED ORDER — FENOFIBRATE MICRONIZED 134 MG PO CAPS: 134.0000 mg | ORAL_CAPSULE | Freq: Every day | ORAL | Status: DC

## 2010-07-28 ENCOUNTER — Other Ambulatory Visit (HOSPITAL_COMMUNITY): Payer: Self-pay | Admitting: Obstetrics & Gynecology

## 2010-07-28 DIAGNOSIS — Z1231 Encounter for screening mammogram for malignant neoplasm of breast: Secondary | ICD-10-CM

## 2010-08-18 ENCOUNTER — Telehealth: Payer: Self-pay

## 2010-08-18 DIAGNOSIS — M5030 Other cervical disc degeneration, unspecified cervical region: Secondary | ICD-10-CM

## 2010-08-18 NOTE — Telephone Encounter (Signed)
Pt called requesting referral to PT at [facility] for DJD Cervical.

## 2010-08-19 NOTE — Telephone Encounter (Signed)
done

## 2010-08-19 NOTE — Telephone Encounter (Signed)
Pt informed and will expect a call from [facility] with appt info 

## 2010-08-25 ENCOUNTER — Encounter: Payer: Self-pay | Admitting: Internal Medicine

## 2010-08-25 ENCOUNTER — Ambulatory Visit (INDEPENDENT_AMBULATORY_CARE_PROVIDER_SITE_OTHER): Payer: Medicare Other | Admitting: Internal Medicine

## 2010-08-25 VITALS — BP 120/84 | HR 81 | Temp 98.5°F | Ht 62.0 in | Wt 189.1 lb

## 2010-08-25 DIAGNOSIS — T148XXA Other injury of unspecified body region, initial encounter: Secondary | ICD-10-CM

## 2010-08-25 DIAGNOSIS — T7840XA Allergy, unspecified, initial encounter: Secondary | ICD-10-CM

## 2010-08-25 DIAGNOSIS — W57XXXA Bitten or stung by nonvenomous insect and other nonvenomous arthropods, initial encounter: Secondary | ICD-10-CM

## 2010-08-25 MED ORDER — TRIAMCINOLONE ACETONIDE 0.1 % EX OINT: TOPICAL_OINTMENT | Freq: Two times a day (BID) | CUTANEOUS | Status: DC

## 2010-08-25 MED ORDER — METHYLPREDNISOLONE ACETATE 80 MG/ML IJ SUSP
80.0000 mg | Freq: Once | INTRAMUSCULAR | Status: AC
  Administered 2010-08-25: 80 mg via INTRAMUSCULAR

## 2010-08-25 NOTE — Patient Instructions (Signed)
It was good to see you today. Kenalog ointment to skin 2x/day as needed for itch - Your prescription(s) have been submitted to your pharmacy. Please take as directed and contact our office if you believe you are having problem(s) with the medication(s). Medrol shot given for allergic reaction

## 2010-08-25 NOTE — Progress Notes (Signed)
  Subjective:    Patient ID: Colleen Walsh, female    DOB: 1950/03/26, 60 y.o.   MRN: [id number]  HPI complains of insect bites (fireants) Located BLE - feet and lower legs precipitated by outdoor exposure 4 Walsh ago associated with intense itch, no fever Not improving with OC aides/creams  Past Medical History  Diagnosis Date  . VENEREAL WART   . CONTACT DERMATITIS&OTHER ECZEMA DUE UNSPEC CAUSE   . GLUCOSE INTOLERANCE, HX OF   . DEGENERATIVE JOINT DISEASE, CERVICAL SPINE   . HYPERTENSION   . DYSLIPIDEMIA   . GERD     Review of Systems  Respiratory: Negative for shortness of breath, wheezing and stridor.   Neurological: Negative for headaches.       Objective:   Physical Exam BP 120/84  Pulse 81  Temp(Src) 98.5 F (36.9 C) (Oral)  Ht 5\' 2"  (1.575 m)  Wt 189 lb 1.9 oz (85.784 kg)  BMI 34.59 kg/m2  SpO2 97% Constitutional: She is oriented to person, place, and time. She appears well-developed and well-nourished. No distress.  Cardiovascular: Normal rate, regular rhythm and normal heart sounds.  No murmur heard. No BLE edema. Pulmonary/Chest: Effort normal and breath sounds normal. No respiratory distress. She has no wheezes.  Skin: Numerous hives with small pustules on B feet and calves insect bites, no cellultis or ulceration; excoriation marks BLE Psychiatric: She has a normal mood and affect. Her behavior is normal. Judgment and thought content normal.       Assessment & Plan:  Insect bites - tx itch symptoms with medrol shot for allergic response and topical steroids - avoidance and education/reassurace provided

## 2010-09-03 ENCOUNTER — Ambulatory Visit (HOSPITAL_COMMUNITY)
Admission: RE | Admit: 2010-09-03 | Discharge: 2010-09-03 | Disposition: A | Discharge status: Home / Self Care | Payer: Medicare Other | Source: Ambulatory Visit | Attending: Obstetrics & Gynecology | Admitting: Obstetrics & Gynecology

## 2010-09-03 DIAGNOSIS — Z1231 Encounter for screening mammogram for malignant neoplasm of breast: Secondary | ICD-10-CM | POA: Insufficient documentation

## 2010-09-09 ENCOUNTER — Ambulatory Visit: Payer: Medicare Other | Attending: Internal Medicine | Admitting: Physical Therapy

## 2010-09-09 DIAGNOSIS — M542 Cervicalgia: Secondary | ICD-10-CM | POA: Insufficient documentation

## 2010-09-09 DIAGNOSIS — M2569 Stiffness of other specified joint, not elsewhere classified: Secondary | ICD-10-CM | POA: Insufficient documentation

## 2010-09-09 DIAGNOSIS — R5381 Other malaise: Secondary | ICD-10-CM | POA: Insufficient documentation

## 2010-09-09 DIAGNOSIS — IMO0001 Reserved for inherently not codable concepts without codable children: Secondary | ICD-10-CM | POA: Insufficient documentation

## 2010-09-20 ENCOUNTER — Ambulatory Visit: Payer: Medicare Other | Admitting: Internal Medicine

## 2010-09-23 ENCOUNTER — Ambulatory Visit: Payer: Medicare Other | Attending: Internal Medicine | Admitting: Physical Therapy

## 2010-09-23 DIAGNOSIS — M542 Cervicalgia: Secondary | ICD-10-CM | POA: Insufficient documentation

## 2010-09-23 DIAGNOSIS — M2569 Stiffness of other specified joint, not elsewhere classified: Secondary | ICD-10-CM | POA: Insufficient documentation

## 2010-09-23 DIAGNOSIS — IMO0001 Reserved for inherently not codable concepts without codable children: Secondary | ICD-10-CM | POA: Insufficient documentation

## 2010-09-23 DIAGNOSIS — R5381 Other malaise: Secondary | ICD-10-CM | POA: Insufficient documentation

## 2010-10-07 ENCOUNTER — Encounter: Payer: Medicare Other | Admitting: Physical Therapy

## 2010-10-14 ENCOUNTER — Ambulatory Visit: Payer: Medicare Other | Attending: Internal Medicine | Admitting: Physical Therapy

## 2010-10-14 DIAGNOSIS — M542 Cervicalgia: Secondary | ICD-10-CM | POA: Insufficient documentation

## 2010-10-14 DIAGNOSIS — R5381 Other malaise: Secondary | ICD-10-CM | POA: Insufficient documentation

## 2010-10-14 DIAGNOSIS — M2569 Stiffness of other specified joint, not elsewhere classified: Secondary | ICD-10-CM | POA: Insufficient documentation

## 2010-10-14 DIAGNOSIS — IMO0001 Reserved for inherently not codable concepts without codable children: Secondary | ICD-10-CM | POA: Insufficient documentation

## 2010-10-21 ENCOUNTER — Encounter: Payer: Medicare Other | Admitting: Physical Therapy

## 2010-11-04 ENCOUNTER — Encounter: Payer: Medicare Other | Admitting: Physical Therapy

## 2010-11-12 ENCOUNTER — Encounter: Payer: Self-pay | Admitting: Internal Medicine

## 2010-12-30 ENCOUNTER — Ambulatory Visit (INDEPENDENT_AMBULATORY_CARE_PROVIDER_SITE_OTHER): Payer: Medicare Other | Admitting: Internal Medicine

## 2010-12-30 ENCOUNTER — Other Ambulatory Visit: Payer: Self-pay | Admitting: Internal Medicine

## 2010-12-30 ENCOUNTER — Encounter: Payer: Self-pay | Admitting: Internal Medicine

## 2010-12-30 ENCOUNTER — Other Ambulatory Visit (INDEPENDENT_AMBULATORY_CARE_PROVIDER_SITE_OTHER): Payer: Medicare Other

## 2010-12-30 DIAGNOSIS — Z862 Personal history of diseases of the blood and blood-forming organs and certain disorders involving the immune mechanism: Secondary | ICD-10-CM

## 2010-12-30 DIAGNOSIS — E785 Hyperlipidemia, unspecified: Secondary | ICD-10-CM

## 2010-12-30 DIAGNOSIS — I10 Essential (primary) hypertension: Secondary | ICD-10-CM

## 2010-12-30 DIAGNOSIS — M479 Spondylosis, unspecified: Secondary | ICD-10-CM

## 2010-12-30 DIAGNOSIS — Z8639 Personal history of other endocrine, nutritional and metabolic disease: Secondary | ICD-10-CM

## 2010-12-30 LAB — LIPID PANEL
Cholesterol: 239 mg/dL — ABNORMAL HIGH (ref 0–200)
HDL: 45 mg/dL (ref >39.00)
Total CHOL/HDL Ratio: 5
Triglycerides: 137 mg/dL (ref 0.0–149.0)
VLDL: 27.4 mg/dL (ref 0.0–40.0)

## 2010-12-30 MED ORDER — CYCLOBENZAPRINE HCL 10 MG PO TABS: 10.0000 mg | ORAL_TABLET | Freq: Two times a day (BID) | ORAL | Status: DC | PRN

## 2010-12-30 MED ORDER — METHOCARBAMOL 750 MG PO TABS: 750.0000 mg | ORAL_TABLET | Freq: Three times a day (TID) | ORAL | Status: DC | PRN

## 2010-12-30 MED ORDER — FENOFIBRATE MICRONIZED 134 MG PO CAPS: 134.0000 mg | ORAL_CAPSULE | Freq: Every day | ORAL | Status: DC

## 2010-12-30 MED ORDER — LOSARTAN POTASSIUM 50 MG PO TABS: 50.0000 mg | ORAL_TABLET | Freq: Every day | ORAL | Status: DC

## 2010-12-30 MED ORDER — HYDROCODONE-ACETAMINOPHEN 5-500 MG PO TABS: 1.0000 | ORAL_TABLET | Freq: Four times a day (QID) | ORAL | Status: DC | PRN

## 2010-12-30 NOTE — Assessment & Plan Note (Signed)
Chronic pain - uses prn vicodin and flexeril for same - ongoing since 1990s Last eval by ortho spine 2010 ([place]): "non operable" - Cont same med mgmt - previously i declined to sign handicap tag for same - defer to specialist if felt necessary for pain control

## 2010-12-30 NOTE — Assessment & Plan Note (Signed)
Noncompliant with statin and zetia or any other rx treatment - Encouraged to re-consider compliance with rx'd tricor Previously in [place] study 11/2009-04/2010 - Also to work on diet and exercise to control same

## 2010-12-30 NOTE — Progress Notes (Signed)
  Subjective:    Patient ID: Colleen Walsh, female    DOB: Dec 12, 1950, 60 y.o.   MRN: [id number]  HPI  Here for 6 mo follow up - reviewed chronic medical issues today:  neck pain - acute on chronic -  Symptoms present >3yr - takes flexeril as needed for same, rare Vicodin when pain severe -  associated with numbness down both arms - also pain with rotation of neck -  Better with flexeril compared to robaxin saw ortho spine [place] fall 2011 - "nonoperable"  Request handicap placard signed due to same  GERD - changed to nexium from dexalant due to dexilant "raising BP" -  BP better and reflux symptoms improved - flares dependant on food type choices   HTN - best BP in AMs-  no adverse side effects - trying to watch salt intake - on generic ARB for cost  dyslipidemia - reluctant to take statin feared side effects, family hx adverse rxn to lipitor - rx'd tricor weight gain 2010 summer with adv effect on same-  Then intentional wt loss early 2011 - but regained weight enrolled with clinical trial 11/2009-04/2010 in [place] - no meds at this time   Past Medical History  Diagnosis Date  . VENEREAL WART   . CONTACT DERMATITIS&OTHER ECZEMA DUE UNSPEC CAUSE   . GLUCOSE INTOLERANCE, HX OF   . DEGENERATIVE JOINT DISEASE, CERVICAL SPINE   . HYPERTENSION   . DYSLIPIDEMIA   . GERD      Review of Systems  Constitutional: Positive for fatigue. Negative for fever.  Respiratory: Negative for shortness of breath.   Cardiovascular: Negative for chest pain.  Neurological: Negative for syncope.       Objective:   Physical Exam  BP 102/82  Pulse 81  Temp(Src) 98.2 F (36.8 C) (Oral)  Wt 188 lb 12.8 oz (85.639 kg)  SpO2 97% Wt Readings from Last 3 Encounters:  12/30/10 188 lb 12.8 oz (85.639 kg)  08/25/10 189 lb 1.9 oz (85.784 kg)  06/14/10 187 lb 4 oz (84.936 kg)   Constitutional: She is overweight; appears well-developed and well-nourished. No distress.  Cardiovascular:  Normal rate, regular rhythm and normal heart sounds.  No murmur heard. No BLE edema. Pulmonary/Chest: Effort normal and breath sounds normal. No respiratory distress. She has no wheezes.  Psychiatric: She has a normal mood and affect. Her behavior is normal. Judgment and thought content normal.       Lab Results  Component Value Date   WBC 5.7 06/16/2008   HGB 14.2 06/16/2008   HCT 40.7 06/16/2008   PLT 307.0 06/16/2008   CHOL 239* 12/16/2008   TRIG 163.0* 12/16/2008   HDL 40.70 12/16/2008   LDLDIRECT 164.7 12/16/2008   ALT 13 06/16/2008   AST 16 06/16/2008   NA 138 06/16/2008   K 4.1 06/16/2008   CL 108 06/16/2008   CREATININE 0.6 06/16/2008   BUN 9 06/16/2008   CO2 26 06/16/2008   TSH 1.04 06/16/2008    Assessment & Plan:  See problem list. Medications and labs reviewed today.

## 2010-12-30 NOTE — Assessment & Plan Note (Signed)
Weight gain reviewed - check a1c now

## 2010-12-30 NOTE — Patient Instructions (Signed)
It was good to see you today. Medications reviewed, resume low dose Tricor (fenofibrate) for cholesterol at this time. Refill on medication(s) as discussed today. Your prescription(s) have been submitted to your pharmacy at [facility]. Please take as directed and contact our office if you believe you are having problem(s) with the medication(s). Work on lifestyle changes as discussed (low fat, low carb, increased protein diet; improved exercise efforts; weight loss) to control sugar, blood pressure and cholesterol levels and/or reduce risk of developing other medical problems. Look into [email or web address] or other type of food journal to assist you in this process. Please schedule followup in 6 months for weight check, call sooner if problems. Your goal is to be down at least 10#!!!

## 2010-12-30 NOTE — Assessment & Plan Note (Signed)
BP Readings from Last 3 Encounters:  12/30/10 102/82  08/25/10 120/84  06/14/10 142/88   Refill ARB and encouraged resumed efforts at weight control -

## 2011-06-10 ENCOUNTER — Other Ambulatory Visit: Payer: Medicare Other

## 2011-06-10 ENCOUNTER — Other Ambulatory Visit: Payer: Self-pay | Admitting: Internal Medicine

## 2011-06-10 ENCOUNTER — Ambulatory Visit (INDEPENDENT_AMBULATORY_CARE_PROVIDER_SITE_OTHER): Payer: Medicare Other | Admitting: Internal Medicine

## 2011-06-10 ENCOUNTER — Encounter: Payer: Self-pay | Admitting: Internal Medicine

## 2011-06-10 VITALS — BP 124/80 | HR 80 | Temp 98.8°F | Resp 16 | Wt 185.0 lb

## 2011-06-10 DIAGNOSIS — L0291 Cutaneous abscess, unspecified: Secondary | ICD-10-CM

## 2011-06-10 DIAGNOSIS — L0390 Cellulitis, unspecified: Secondary | ICD-10-CM

## 2011-06-10 DIAGNOSIS — L723 Sebaceous cyst: Secondary | ICD-10-CM

## 2011-06-10 NOTE — Progress Notes (Signed)
  Subjective:    Patient ID: Colleen Walsh, female    DOB: 09/08/1950, 61 y.o.   MRN: [id number]  HPI New to me she complains of a red, painful cyst on her left lower inner buttocks for one week. She has had a bump in that area for many years that never caused her a problem.   Review of Systems  Constitutional: Negative for chills, activity change, appetite change and fatigue.  HENT: Negative.   Eyes: Negative.   Respiratory: Negative.   Cardiovascular: Negative.   Gastrointestinal: Negative.  Negative for nausea, vomiting, abdominal pain and diarrhea.  Genitourinary: Negative.   Musculoskeletal: Negative.   Skin: Positive for color change (redness). Negative for pallor, rash and wound.  Neurological: Negative.   Hematological: Negative for adenopathy. Does not bruise/bleed easily.  Psychiatric/Behavioral: Negative.        Objective:   Physical Exam  Vitals reviewed. Constitutional: She is oriented to person, place, and time. She appears well-developed and well-nourished. No distress.  HENT:  Head: Normocephalic and atraumatic.  Mouth/Throat: Oropharynx is clear and moist. No oropharyngeal exudate.  Eyes: Conjunctivae are normal. Right eye exhibits no discharge. Left eye exhibits no discharge. No scleral icterus.  Neck: Neck supple. No JVD present. No tracheal deviation present. No thyromegaly present.  Cardiovascular: Normal rate, regular rhythm, normal heart sounds and intact distal pulses.  Exam reveals no gallop and no friction rub.   No murmur heard. Pulmonary/Chest: Effort normal and breath sounds normal. No stridor. No respiratory distress. She has no wheezes. She has no rales. She exhibits no tenderness.  Abdominal: Soft. Bowel sounds are normal. She exhibits no distension and no mass. There is no tenderness. There is no rebound and no guarding.  Genitourinary:    There is no rash, tenderness, lesion or injury on the right labia. There is no rash, tenderness,  lesion or injury on the left labia.  Musculoskeletal: Normal range of motion. She exhibits no edema and no tenderness.  Lymphadenopathy:    She has no cervical adenopathy.  Neurological: She is oriented to person, place, and time.  Skin: Skin is warm and dry. No abrasion, no bruising, no burn, no ecchymosis, no laceration, no lesion and no rash noted. She is not diaphoretic. There is erythema. No pallor.       The area of note was prepped and draped in sterile fashion and local anesthesia was obtained with lido 2% with epi, 2 cc, a 4 mm punch incision was made and the contents of a sebaceous cyst was removed with no exudate or loculations, it ws packed with iodoform, she tolerated the procedure well with no blood loss  Psychiatric: She has a normal mood and affect. Her behavior is normal. Judgment and thought content normal.          Assessment & Plan:

## 2011-06-10 NOTE — Assessment & Plan Note (Signed)
The cyst has been removed, I packed the cavity in the event that more of the cyst needs to drain out

## 2011-06-10 NOTE — Assessment & Plan Note (Signed)
I think this is an inflamed seb cyst so I did not start antibiotics, a culture was sent and if it is + then I will treat with antibiotics, she will leave the packing in place and will return in 3 days for a recheck

## 2011-06-10 NOTE — Patient Instructions (Signed)
Incision and Drainage Incision and drainage (I&D) is a procedure in which a cavity-like structure (cystic structure) is opened and drained. The cyst to be drained usually contains material such as pus, fluid, or blood. Gauze is sometimes packed into the cut (incision). Keeping a drain or piece of gauze in the incision keeps the skin from healing first. This helps stop the cyst from forming again. HOME CARE INSTRUCTIONS   Only take over-the-counter or prescription medicines for pain, discomfort, or fever as directed by your caregiver. Use these only if your caregiver has not given medicines that would interfere.   See your caregiver as directed for a recheck.   If medicines (antibiotics) that kill germs were prescribed, take them as directed.  SEEK MEDICAL CARE IF:   You develop increased pain, swelling, redness, drainage, or bleeding in the wound.   You develop signs of an infection. These signs include muscle aches, chills, or a general ill feeling.   You have a fever.  MAKE SURE YOU:   Understand these instructions.   Will watch your condition.   Will get help right away if you are not doing well or get worse.  Document Released: 06/22/2000 Document Revised: 12/16/2010 Document Reviewed: 08/17/2007 [facility] Patient Information 2012 [place], [place].Epidermal Cyst An epidermal cyst is sometimes called a sebaceous cyst, epidermal inclusion cyst, or infundibular cyst. These cysts usually contain a substance that looks "pasty" or "cheesy" and may have a bad smell. This substance is a protein called keratin. Epidermal cysts are usually found on the face, neck, or trunk. They may also occur in the vaginal area or other parts of the genitalia of both men and women. Epidermal cysts are usually small, painless, slow-growing bumps or lumps that move freely under the skin. It is important not to try to pop them. This may cause an infection and lead to tenderness and swelling. CAUSES  Epidermal cysts  may be caused by a deep penetrating injury to the skin or a plugged hair follicle, often associated with acne. SYMPTOMS  Epidermal cysts can become inflamed and cause:  Redness.   Tenderness.   Increased temperature of the skin over the bumps or lumps.   Grayish-white, bad smelling material that drains from the bump or lump.  DIAGNOSIS  Epidermal cysts are easily diagnosed by your caregiver during an exam. Rarely, a tissue sample (biopsy) may be taken to rule out other conditions that may resemble epidermal cysts. TREATMENT   Epidermal cysts often get better and disappear on their own. They are rarely ever cancerous.   If a cyst becomes infected, it may become inflamed and tender. This may require opening and draining the cyst. Treatment with antibiotics may be necessary. When the infection is gone, the cyst may be removed with minor surgery.   Small, inflamed cysts can often be treated with antibiotics or by injecting steroid medicines.   Sometimes, epidermal cysts become large and bothersome. If this happens, surgical removal in your caregiver's office may be necessary.  HOME CARE INSTRUCTIONS  Only take over-the-counter or prescription medicines as directed by your caregiver.   Take your antibiotics as directed. Finish them even if you start to feel better.  SEEK MEDICAL CARE IF:   Your cyst becomes tender, red, or swollen.   Your condition is not improving or is getting worse.   You have any other questions or concerns.  MAKE SURE YOU:  Understand these instructions.   Will watch your condition.   Will get help right away  if you are not doing well or get worse.  Document Released: 11/28/2003 Document Revised: 12/16/2010 Document Reviewed: 07/05/2010 [facility] Patient Information 2012 [place], [place].

## 2011-06-13 ENCOUNTER — Encounter: Payer: Self-pay | Admitting: Internal Medicine

## 2011-06-13 ENCOUNTER — Ambulatory Visit (INDEPENDENT_AMBULATORY_CARE_PROVIDER_SITE_OTHER): Payer: Medicare Other | Admitting: Internal Medicine

## 2011-06-13 VITALS — BP 106/78 | HR 76 | Temp 98.1°F | Resp 16 | Wt 183.0 lb

## 2011-06-13 DIAGNOSIS — L723 Sebaceous cyst: Secondary | ICD-10-CM

## 2011-06-13 DIAGNOSIS — L0291 Cutaneous abscess, unspecified: Secondary | ICD-10-CM

## 2011-06-13 DIAGNOSIS — L0390 Cellulitis, unspecified: Secondary | ICD-10-CM

## 2011-06-13 NOTE — Progress Notes (Signed)
  Subjective:    Patient ID: Colleen Walsh, female    DOB: 06-01-1950, 61 y.o.   MRN: [id number]  Wound Check She was originally treated 2 to 3 Walsh ago. Previous treatment included I&D of abscess. There has been no drainage from the wound. There is no redness present. There is no swelling present. The pain has no pain. She has no difficulty moving the affected extremity or digit.      Review of Systems  Constitutional: Negative.   HENT: Negative.   Eyes: Negative.   Respiratory: Negative.   Cardiovascular: Negative.   Gastrointestinal: Negative.   Genitourinary: Negative.   Musculoskeletal: Negative.   Skin: Negative.   Neurological: Negative.   Hematological: Negative.   Psychiatric/Behavioral: Negative.        Objective:   Physical Exam  Vitals reviewed. Constitutional: She appears well-developed and well-nourished. No distress.  HENT:  Head: Normocephalic and atraumatic.  Mouth/Throat: Oropharynx is clear and moist. No oropharyngeal exudate.  Eyes: Conjunctivae are normal. Right eye exhibits no discharge. Left eye exhibits no discharge. No scleral icterus.  Neck: Normal range of motion. Neck supple. No JVD present. No tracheal deviation present. No thyromegaly present.  Cardiovascular: Normal rate, regular rhythm, normal heart sounds and intact distal pulses.  Exam reveals no gallop and no friction rub.   No murmur heard. Pulmonary/Chest: Effort normal and breath sounds normal. No stridor. No respiratory distress. She has no wheezes. She has no rales. She exhibits no tenderness.  Abdominal: Soft. Bowel sounds are normal. She exhibits no distension and no mass. There is no tenderness. There is no rebound and no guarding.  Genitourinary:     Musculoskeletal: Normal range of motion. She exhibits no edema and no tenderness.  Lymphadenopathy:    She has no cervical adenopathy.  Skin: Skin is warm and dry. No rash noted. She is not diaphoretic. No erythema. No  pallor.  Psychiatric: She has a normal mood and affect. Her behavior is normal. Judgment and thought content normal.          Assessment & Plan:

## 2011-06-13 NOTE — Patient Instructions (Signed)

## 2011-06-13 NOTE — Assessment & Plan Note (Signed)
It appears that all of the cyst has been removed and she is healing very nicely with no evidence of infection or complications

## 2011-06-13 NOTE — Assessment & Plan Note (Signed)
This has resolved.

## 2011-06-14 LAB — WOUND CULTURE

## 2011-08-01 ENCOUNTER — Other Ambulatory Visit (HOSPITAL_COMMUNITY): Payer: Self-pay | Admitting: Obstetrics & Gynecology

## 2011-08-01 DIAGNOSIS — Z1231 Encounter for screening mammogram for malignant neoplasm of breast: Secondary | ICD-10-CM

## 2011-08-25 ENCOUNTER — Ambulatory Visit: Payer: Medicare Other | Admitting: Internal Medicine

## 2011-09-05 ENCOUNTER — Ambulatory Visit (HOSPITAL_COMMUNITY)
Admission: RE | Admit: 2011-09-05 | Discharge: 2011-09-05 | Disposition: A | Discharge status: Home / Self Care | Payer: Medicare Other | Source: Ambulatory Visit | Attending: Obstetrics & Gynecology | Admitting: Obstetrics & Gynecology

## 2011-09-05 DIAGNOSIS — Z1231 Encounter for screening mammogram for malignant neoplasm of breast: Secondary | ICD-10-CM | POA: Insufficient documentation

## 2011-09-07 ENCOUNTER — Ambulatory Visit: Payer: Medicare Other | Admitting: Internal Medicine

## 2011-09-08 ENCOUNTER — Encounter: Payer: Self-pay | Admitting: Internal Medicine

## 2011-09-08 ENCOUNTER — Other Ambulatory Visit (INDEPENDENT_AMBULATORY_CARE_PROVIDER_SITE_OTHER): Payer: Medicare Other

## 2011-09-08 ENCOUNTER — Ambulatory Visit (INDEPENDENT_AMBULATORY_CARE_PROVIDER_SITE_OTHER): Payer: Medicare Other | Admitting: Internal Medicine

## 2011-09-08 VITALS — BP 130/80 | HR 79 | Temp 99.3°F | Ht 62.0 in | Wt 184.8 lb

## 2011-09-08 DIAGNOSIS — R5383 Other fatigue: Secondary | ICD-10-CM

## 2011-09-08 DIAGNOSIS — Z862 Personal history of diseases of the blood and blood-forming organs and certain disorders involving the immune mechanism: Secondary | ICD-10-CM

## 2011-09-08 DIAGNOSIS — I10 Essential (primary) hypertension: Secondary | ICD-10-CM

## 2011-09-08 DIAGNOSIS — R5381 Other malaise: Secondary | ICD-10-CM

## 2011-09-08 DIAGNOSIS — E785 Hyperlipidemia, unspecified: Secondary | ICD-10-CM

## 2011-09-08 DIAGNOSIS — Z8639 Personal history of other endocrine, nutritional and metabolic disease: Secondary | ICD-10-CM

## 2011-09-08 LAB — BASIC METABOLIC PANEL
BUN: 8 mg/dL (ref 6–23)
Calcium: 9.6 mg/dL (ref 8.4–10.5)
GFR: 84.12 mL/min (ref >60.00)
Glucose, Bld: 88 mg/dL (ref 70–99)
Potassium: 4.3 mEq/L (ref 3.5–5.1)
Sodium: 136 mEq/L (ref 135–145)

## 2011-09-08 LAB — CBC WITH DIFFERENTIAL/PLATELET
Basophils Absolute: 0.1 10*3/uL (ref 0.0–0.1)
Eosinophils Relative: 1.5 % (ref 0.0–5.0)
HCT: 42.2 % (ref 36.0–46.0)
Hemoglobin: 14 g/dL (ref 12.0–15.0)
Lymphocytes Relative: 38.7 % (ref 12.0–46.0)
Lymphs Abs: 2.3 10*3/uL (ref 0.7–4.0)
Monocytes Relative: 6.7 % (ref 3.0–12.0)
Platelets: 367 10*3/uL (ref 150.0–400.0)
RDW: 13.4 % (ref 11.5–14.6)
WBC: 6 10*3/uL (ref 4.5–10.5)

## 2011-09-08 LAB — HEPATIC FUNCTION PANEL
ALT: 15 U/L (ref 0–35)
AST: 14 U/L (ref 0–37)
Total Protein: 7.6 g/dL (ref 6.0–8.3)

## 2011-09-08 LAB — LIPID PANEL
Cholesterol: 230 mg/dL — ABNORMAL HIGH (ref 0–200)
HDL: 46 mg/dL (ref >39.00)
VLDL: 26.8 mg/dL (ref 0.0–40.0)

## 2011-09-08 LAB — SEDIMENTATION RATE: Sed Rate: 21 mm/hr (ref 0–22)

## 2011-09-08 MED ORDER — HYDROCODONE-ACETAMINOPHEN 5-500 MG PO TABS: 1.0000 | ORAL_TABLET | Freq: Four times a day (QID) | ORAL | Status: DC | PRN

## 2011-09-08 MED ORDER — LOSARTAN POTASSIUM 50 MG PO TABS: 50.0000 mg | ORAL_TABLET | Freq: Every day | ORAL | Status: DC

## 2011-09-08 NOTE — Assessment & Plan Note (Signed)
BP Readings from Last 3 Encounters:  09/08/11 130/80  06/13/11 106/78  06/10/11 124/80   Refill ARB and encouraged resumed efforts at weight control -  The current medical regimen is effective;  continue present plan and medications.

## 2011-09-08 NOTE — Assessment & Plan Note (Signed)
The patient is asked to make an attempt to improve diet and exercise patterns to aid in medical management of this problem.  Lab Results  Component Value Date   HGBA1C 5.3 12/30/2010

## 2011-09-08 NOTE — Patient Instructions (Signed)
It was good to see you today. Test(s) ordered today. Your results will be called to you after review (48-72hours after test completion). If any changes need to be made, you will be notified at that time.  Medications reviewed, no changes recommended. Refill on medication(s) as discussed today. Your prescription(s) have been submitted to your pharmacy at [facility]. Please take as directed and contact our office if you believe you are having problem(s) with the medication(s). Work on lifestyle changes as discussed (low fat, low carb, increased protein diet; improved exercise efforts; weight loss) to control sugar, blood pressure and cholesterol levels and/or reduce risk of developing other medical problems. Look into [email or web address].com.cy or other type of food journal to assist you in this process. Please schedule followup in 6 months for weight check, call sooner if problems. Your goal is to be down at least 10#!!!

## 2011-09-08 NOTE — Assessment & Plan Note (Signed)
Noncompliant with statin and zetia or any other rx treatment - Encouraged to re-consider compliance with rx'd tricor but pt refuses Previously in [place] study 11/2009-04/2010 - Also to work on diet and exercise to control same 

## 2011-09-08 NOTE — Progress Notes (Signed)
  Subjective:    Patient ID: Colleen Walsh, female    DOB: 06-08-1950, 61 y.o.   MRN: [id number]  HPI  Here for 6 mo follow up - reviewed chronic medical issues today:  neck pain - acute on chronic - ?fibromyalgia  Symptoms present >21yr - takes flexeril as needed for same, rare Vicodin when pain severe -  associated with numbness down both arms - also pain with rotation of neck -  Better with flexeril compared to robaxin saw ortho spine [place] fall 2011 - "nonoperable"  has handicap placard due to same  GERD - changed to nexium from dexalant due to dexilant "raising BP" -  BP better and reflux symptoms improved - flares dependant on food type choices   HTN - best BP in AMs-  no adverse side effects - trying to watch salt intake - on generic ARB for cost  dyslipidemia - reluctant to take statin feared side effects, family hx adverse rxn to lipitor - rx'd tricor but also causes fatigue. weight gain 2010 summer with adverse effect on same-  Paroxysmal intentional wt loss then regains weight enrolled with clinical trial 11/2009-04/2010 in [place] - no meds at this time   Past Medical History  Diagnosis Date  . VENEREAL WART   . CONTACT DERMATITIS&OTHER ECZEMA DUE UNSPEC CAUSE   . GLUCOSE INTOLERANCE, HX OF   . DEGENERATIVE JOINT DISEASE, CERVICAL SPINE   . HYPERTENSION   . DYSLIPIDEMIA   . GERD      Review of Systems  Constitutional: Positive for fatigue. Negative for fever.  Respiratory: Negative for cough and shortness of breath.   Cardiovascular: Negative for chest pain.  Neurological: Negative for syncope.       Objective:   Physical Exam  BP 130/80  Pulse 79  Temp 99.3 F (37.4 C) (Oral)  Ht 5\' 2"  (1.575 m)  Wt 184 lb 12.8 oz (83.825 kg)  BMI 33.80 kg/m2  SpO2 97% Wt Readings from Last 3 Encounters:  09/08/11 184 lb 12.8 oz (83.825 kg)  06/13/11 183 lb (83.008 kg)  06/10/11 185 lb (83.915 kg)   Constitutional: She is overweight; appears  well-developed and well-nourished. No distress.  Neck: thick, supple, no thyroid nodules Cardiovascular: Normal rate, regular rhythm and normal heart sounds.  No murmur heard. No BLE edema. Pulmonary/Chest: Effort normal and breath sounds normal. No respiratory distress. She has no wheezes.  MSkel: no gross deformities or effusions Psychiatric: She has a dsyphoric mood and dramatized affect, occ inappropriate laughing. Judgment and thought content normal.       Lab Results  Component Value Date   WBC 5.7 06/16/2008   HGB 14.2 06/16/2008   HCT 40.7 06/16/2008   PLT 307.0 06/16/2008   CHOL 239* 12/30/2010   TRIG 137.0 12/30/2010   HDL 45.00 12/30/2010   LDLDIRECT 162.8 12/30/2010   ALT 13 06/16/2008   AST 16 06/16/2008   NA 138 06/16/2008   K 4.1 06/16/2008   CL 108 06/16/2008   CREATININE 0.6 06/16/2008   BUN 9 06/16/2008   CO2 26 06/16/2008   TSH 1.04 06/16/2008   HGBA1C 5.3 12/30/2010    Assessment & Plan:  See problem list. Medications and labs reviewed today.  Fatigue - nonspecific symptoms/exam - check screening labs  Depression, complicating fibromyalgia overlap - pt with high "screening score" but declines med or counseling for same due to feared potential side effects

## 2011-10-13 ENCOUNTER — Telehealth: Payer: Self-pay | Admitting: *Deleted

## 2011-10-13 DIAGNOSIS — M542 Cervicalgia: Secondary | ICD-10-CM

## 2011-10-13 NOTE — Telephone Encounter (Signed)
Pt called requesting referral to neurosurgeon for disc in her neck.

## 2011-10-14 ENCOUNTER — Telehealth: Payer: Self-pay | Admitting: Internal Medicine

## 2011-10-14 NOTE — Telephone Encounter (Signed)
Left message for pt to callback office.  

## 2011-10-14 NOTE — Telephone Encounter (Signed)
done

## 2011-10-14 NOTE — Telephone Encounter (Signed)
Caller: [name]/Patient; Patient Name: Colleen Walsh; PCP: Rene Paci (Adults only); Best Callback Phone Number: [phone]; Patient calling back because missed the nurse's call this morning. Per Epic:  Nurse called to notify that referral was made for neurosurgeon. Information given to patient.

## 2011-10-17 NOTE — Telephone Encounter (Signed)
Pt informed

## 2011-10-26 ENCOUNTER — Other Ambulatory Visit: Payer: Self-pay | Admitting: Neurosurgery

## 2011-10-26 DIAGNOSIS — M4802 Spinal stenosis, cervical region: Secondary | ICD-10-CM

## 2011-11-01 ENCOUNTER — Other Ambulatory Visit: Payer: Medicare Other

## 2011-11-24 ENCOUNTER — Ambulatory Visit
Admission: RE | Admit: 2011-11-24 | Discharge: 2011-11-24 | Disposition: A | Discharge status: Home / Self Care | Payer: Medicare Other | Source: Ambulatory Visit | Attending: Neurosurgery | Admitting: Neurosurgery

## 2011-11-24 DIAGNOSIS — M4802 Spinal stenosis, cervical region: Secondary | ICD-10-CM

## 2011-12-06 ENCOUNTER — Other Ambulatory Visit: Payer: Self-pay | Admitting: *Deleted

## 2011-12-06 DIAGNOSIS — M479 Spondylosis, unspecified: Secondary | ICD-10-CM

## 2011-12-06 MED ORDER — CYCLOBENZAPRINE HCL 10 MG PO TABS: 10.0000 mg | ORAL_TABLET | Freq: Two times a day (BID) | ORAL | Status: DC | PRN

## 2011-12-30 ENCOUNTER — Telehealth: Payer: Self-pay | Admitting: *Deleted

## 2011-12-30 MED ORDER — TIZANIDINE HCL 4 MG PO TABS: 4.0000 mg | ORAL_TABLET | Freq: Three times a day (TID) | ORAL | Status: DC | PRN

## 2011-12-30 NOTE — Telephone Encounter (Signed)
Received fax stating pt currently taking cyclobenzaprine 10 mg. Starting after January 1st this rx will no longer be cover on pt drug list. Suggestion alternative.  1. Tizanidine 2. Meloxicam 3. Diclofenac 4. Diflunisal 5. Celebrex

## 2011-12-30 NOTE — Telephone Encounter (Signed)
A user error has taken place../lmb 

## 2011-12-30 NOTE — Telephone Encounter (Signed)
Ok - change to tizanidine

## 2012-06-13 ENCOUNTER — Encounter: Payer: Self-pay | Admitting: Internal Medicine

## 2012-06-13 ENCOUNTER — Other Ambulatory Visit (INDEPENDENT_AMBULATORY_CARE_PROVIDER_SITE_OTHER): Payer: Medicare Other

## 2012-06-13 ENCOUNTER — Ambulatory Visit (INDEPENDENT_AMBULATORY_CARE_PROVIDER_SITE_OTHER): Payer: Medicare Other | Admitting: Internal Medicine

## 2012-06-13 VITALS — BP 142/98 | HR 87 | Temp 98.1°F | Ht 62.0 in | Wt 188.0 lb

## 2012-06-13 DIAGNOSIS — R1032 Left lower quadrant pain: Secondary | ICD-10-CM

## 2012-06-13 LAB — URINALYSIS, ROUTINE W REFLEX MICROSCOPIC
Bilirubin Urine: NEGATIVE
Ketones, ur: NEGATIVE
Leukocytes, UA: NEGATIVE
Specific Gravity, Urine: <=1.005 (ref 1.000–1.030)
Total Protein, Urine: NEGATIVE
Urine Glucose: NEGATIVE
pH: 7 (ref 5.0–8.0)

## 2012-06-13 LAB — BASIC METABOLIC PANEL
BUN: 7 mg/dL (ref 6–23)
CO2: 24 mEq/L (ref 19–32)
Chloride: 102 mEq/L (ref 96–112)
GFR: 97.24 mL/min (ref >60.00)
Glucose, Bld: 84 mg/dL (ref 70–99)
Potassium: 4.2 mEq/L (ref 3.5–5.1)
Sodium: 137 mEq/L (ref 135–145)

## 2012-06-13 LAB — CBC
Platelets: 369 10*3/uL (ref 150.0–400.0)
RBC: 4.3 Mil/uL (ref 3.87–5.11)
WBC: 8.9 10*3/uL (ref 4.5–10.5)

## 2012-06-13 NOTE — Patient Instructions (Addendum)

## 2012-06-13 NOTE — Progress Notes (Signed)
Subjective:    Patient ID: Colleen Walsh, female    DOB: 07-18-1950, 62 y.o.   MRN: [id number]  HPI  Pt presents to the clinic today with c/o left sided abdominal pain. This started Monday. She describes the pain as sharp. She is taking tylenol but it is not helping with the pain. She denies all GI and urinary symptoms. She did eat a table spoon of pure vinegar on Sunday and the abdominal pain started Monday. She did this because Dr. Neil Crouch said that it may aid with weight loss. She denies fever, chills or body ache. She did have a colonoscopy in 2010 which did reveal diverticulosis.  Review of Systems      Past Medical History  Diagnosis Date  . VENEREAL WART   . CONTACT DERMATITIS&OTHER ECZEMA DUE UNSPEC CAUSE   . GLUCOSE INTOLERANCE, HX OF   . DEGENERATIVE JOINT DISEASE, CERVICAL SPINE   . HYPERTENSION   . DYSLIPIDEMIA   . GERD     Current Outpatient Prescriptions  Medication Sig Dispense Refill  . Ascorbic Acid (VITAMIN C) 1000 MG tablet Take 1,000 mg by mouth daily.        . Calcium-Vitamin D (CALTRATE 600 PLUS-VIT D PO) Take 1 each by mouth daily.        Marland Kitchen HYDROcodone-acetaminophen (VICODIN) 5-500 MG per tablet Take 1 tablet by mouth every 6 (six) hours as needed.  30 tablet  1  . losartan (COZAAR) 50 MG tablet Take 1 tablet (50 mg total) by mouth daily.  30 tablet  11  . Multiple Vitamins-Minerals (MULTIVITAMIN,TX-MINERALS) tablet Take 1 tablet by mouth daily.        Marland Kitchen tiZANidine (ZANAFLEX) 4 MG tablet Take 1 tablet (4 mg total) by mouth every 8 (eight) hours as needed (muscle spasm/cramps).  60 tablet  1  . triamcinolone (KENALOG) 0.1 % ointment Apply topically 2 (two) times daily.  30 g  0   No current facility-administered medications for this visit.    Allergies  Allergen Reactions  . Dexlansoprazole     Raises BP  . Doxycycline     REACTION: rash on hand  . Ergostat (Ergotamine Tartrate)   . Lisinopril     Subtherapeutic  . Omeprazole     REACTION: nausea   . Pantoprazole Sodium     Subtherapeutic  . Phentermine     Raises BP  . Prilosec (Omeprazole Magnesium)   . Propranolol     Betachron  . Ranitidine     Subtherapeutic  . Verapamil     REACTION: nausea    Family History  Problem Relation Age of Onset  . Alcohol abuse Other     parents  . Arthritis Other     grandparent  . Colon cancer Other     parent, other relative  . Hypertension Other     parent, other relative  . Stroke Other     parent, other relative    History   Social History  . Marital Status: Married    Spouse Name: N/A    Number of Children: N/A  . Years of Education: N/A   Occupational History  . Not on file.   Social History Main Topics  . Smoking status: Never Smoker   . Smokeless tobacco: Not on file     Comment: Married, lives with spouse. disable due to neck pain  . Alcohol Use: No  . Drug Use: Yes  . Sexually Active: Not Currently   Other Topics Concern  .  Not on file   Social History Narrative  . No narrative on file     Constitutional: Denies fever, malaise, fatigue, headache or abrupt weight changes.  Respiratory: Denies difficulty breathing, shortness of breath, cough or sputum production.   Cardiovascular: Denies chest pain, chest tightness, palpitations or swelling in the hands or feet.  Gastrointestinal: Pt reports lefts side abdominal pain. Denies bloating, constipation, diarrhea or blood in the stool.  GU: Denies urgency, frequency, pain with urination, burning sensation, blood in urine, odor or discharge. Musculoskeletal: Denies decrease in range of motion, difficulty with gait, muscle pain or joint pain and swelling.    No other specific complaints in a complete review of systems (except as listed in HPI above).  Objective:   Physical Exam   BP 142/98  Pulse 87  Temp(Src) 98.1 F (36.7 C) (Oral)  Ht 5\' 2"  (1.575 m)  Wt 188 lb (85.276 kg)  BMI 34.38 kg/m2  SpO2 98% Wt Readings from Last 3 Encounters:  06/13/12  188 lb (85.276 kg)  09/08/11 184 lb 12.8 oz (83.825 kg)  06/13/11 183 lb (83.008 kg)    General: Appears her stated age, well developed, well nourished in NAD. Cardiovascular: Normal rate and rhythm. S1,S2 noted.  No murmur, rubs or gallops noted. No JVD or BLE edema. No carotid bruits noted. Pulmonary/Chest: Normal effort and positive vesicular breath sounds. No respiratory distress. No wheezes, rales or ronchi noted.  Abdomen: Soft and tender in the left groin. Normal bowel sounds, no bruits noted. No distention or masses noted. Liver, spleen and kidneys non palpable.   BMET    Component Value Date/Time   NA 136 09/08/2011 1152   K 4.3 09/08/2011 1152   CL 102 09/08/2011 1152   CO2 26 09/08/2011 1152   GLUCOSE 88 09/08/2011 1152   BUN 8 09/08/2011 1152   CREATININE 0.8 09/08/2011 1152   CALCIUM 9.6 09/08/2011 1152   GFRNONAA 110.08 06/16/2008 0000    Lipid Panel     Component Value Date/Time   CHOL 230* 09/08/2011 1152   TRIG 134.0 09/08/2011 1152   HDL 46.00 09/08/2011 1152   CHOLHDL 5 09/08/2011 1152   VLDL 26.8 09/08/2011 1152    CBC    Component Value Date/Time   WBC 6.0 09/08/2011 1152   RBC 4.61 09/08/2011 1152   HGB 14.0 09/08/2011 1152   HCT 42.2 09/08/2011 1152   PLT 367.0 09/08/2011 1152   MCV 91.5 09/08/2011 1152   MCHC 33.1 09/08/2011 1152   RDW 13.4 09/08/2011 1152   LYMPHSABS 2.3 09/08/2011 1152   MONOABS 0.4 09/08/2011 1152   EOSABS 0.1 09/08/2011 1152   BASOSABS 0.1 09/08/2011 1152    Hgb A1C Lab Results  Component Value Date   HGBA1C 5.3 12/30/2010        Assessment & Plan:   LLq pain, closer to the groin, new onset:  Will check labs ? MSK in origin Not likely related to the vinegar ingestion Will obtain ultrasound of LLQ  Will f/u after results are back, if worse go to ER

## 2012-06-14 ENCOUNTER — Other Ambulatory Visit: Payer: Self-pay | Admitting: *Deleted

## 2012-06-14 ENCOUNTER — Ambulatory Visit
Admission: RE | Admit: 2012-06-14 | Discharge: 2012-06-14 | Disposition: A | Discharge status: Home / Self Care | Payer: Medicare Other | Source: Ambulatory Visit | Attending: Internal Medicine | Admitting: Internal Medicine

## 2012-06-14 ENCOUNTER — Inpatient Hospital Stay: Admission: RE | Admit: 2012-06-14 | Payer: Medicare Other | Source: Ambulatory Visit

## 2012-06-14 DIAGNOSIS — R1032 Left lower quadrant pain: Secondary | ICD-10-CM

## 2012-07-10 LAB — HM PAP SMEAR

## 2012-08-13 ENCOUNTER — Other Ambulatory Visit (HOSPITAL_COMMUNITY): Payer: Self-pay | Admitting: Obstetrics & Gynecology

## 2012-08-13 DIAGNOSIS — Z1231 Encounter for screening mammogram for malignant neoplasm of breast: Secondary | ICD-10-CM

## 2012-09-05 ENCOUNTER — Ambulatory Visit (HOSPITAL_COMMUNITY)
Admission: RE | Admit: 2012-09-05 | Discharge: 2012-09-05 | Disposition: A | Discharge status: Home / Self Care | Payer: Medicare Other | Source: Ambulatory Visit | Attending: Obstetrics & Gynecology | Admitting: Obstetrics & Gynecology

## 2012-09-05 DIAGNOSIS — Z1231 Encounter for screening mammogram for malignant neoplasm of breast: Secondary | ICD-10-CM | POA: Insufficient documentation

## 2012-09-19 ENCOUNTER — Encounter: Payer: Self-pay | Admitting: Internal Medicine

## 2012-09-19 ENCOUNTER — Ambulatory Visit (INDEPENDENT_AMBULATORY_CARE_PROVIDER_SITE_OTHER): Payer: Medicare Other | Admitting: Internal Medicine

## 2012-09-19 ENCOUNTER — Ambulatory Visit (INDEPENDENT_AMBULATORY_CARE_PROVIDER_SITE_OTHER): Payer: Medicare Other

## 2012-09-19 VITALS — BP 140/88 | HR 72 | Temp 98.3°F | Wt 182.4 lb

## 2012-09-19 DIAGNOSIS — M479 Spondylosis, unspecified: Secondary | ICD-10-CM

## 2012-09-19 DIAGNOSIS — R5381 Other malaise: Secondary | ICD-10-CM

## 2012-09-19 DIAGNOSIS — I10 Essential (primary) hypertension: Secondary | ICD-10-CM

## 2012-09-19 DIAGNOSIS — E785 Hyperlipidemia, unspecified: Secondary | ICD-10-CM

## 2012-09-19 DIAGNOSIS — Z0000 Encounter for general adult medical examination without abnormal findings: Secondary | ICD-10-CM

## 2012-09-19 DIAGNOSIS — G894 Chronic pain syndrome: Secondary | ICD-10-CM

## 2012-09-19 LAB — TSH: TSH: 0.92 u[IU]/mL (ref 0.35–5.50)

## 2012-09-19 LAB — LIPID PANEL
Cholesterol: 251 mg/dL — ABNORMAL HIGH (ref 0–200)
HDL: 41.2 mg/dL (ref >39.00)
Triglycerides: 116 mg/dL (ref 0.0–149.0)

## 2012-09-19 LAB — HEPATIC FUNCTION PANEL
ALT: 12 U/L (ref 0–35)
AST: 14 U/L (ref 0–37)
Albumin: 4.3 g/dL (ref 3.5–5.2)
Total Protein: 7.8 g/dL (ref 6.0–8.3)

## 2012-09-19 MED ORDER — HYDROCODONE-ACETAMINOPHEN 5-325 MG PO TABS: 1.0000 | ORAL_TABLET | Freq: Four times a day (QID) | ORAL | Status: DC | PRN

## 2012-09-19 MED ORDER — CYCLOBENZAPRINE HCL 10 MG PO TABS
10.0000 mg | ORAL_TABLET | Freq: Two times a day (BID) | ORAL | Status: DC | PRN
Start: 2012-09-19 — End: 2013-12-10

## 2012-09-19 NOTE — Assessment & Plan Note (Signed)
Noncompliant with statin and zetia or any other rx treatment - Encouraged to re-consider compliance with rx'd tricor but pt refuses Previously in [place] study 11/2009-04/2010 - Also to work on diet and exercise to control same

## 2012-09-19 NOTE — Assessment & Plan Note (Signed)
BP Readings from Last 3 Encounters:  09/19/12 140/88  06/13/12 142/98  09/08/11 130/80   The current medical regimen is effective;  continue present plan and medications.

## 2012-09-19 NOTE — Assessment & Plan Note (Signed)
Chronic pain - uses prn vicodin and flexeril for same - ongoing since 1990s Eval by ortho spine 2010 ([place]): "non operable" - Nsurg eval Fall 2013 [place] [facility]): pt declined surgical intervention due to risks Patient reports symptoms not controlled with current uscle relaxer and dose of hydrocodone -  Continue same and refer to pain mgmt for review of meds and symptoms

## 2012-09-19 NOTE — Patient Instructions (Addendum)
It was good to see you today. We have reviewed your prior records including labs and tests today Health Maintenance reviewed - all recommended immunizations and age-appropriate screenings are up-to-date or declined Test(s) ordered today. Your results will be called to you after review (48-72hours after test completion). If any changes need to be made, you will be notified at that time.  Medications reviewed and updated, no changes recommended. Refill on medication(s) as discussed today. we'll make referral to pain clinic. Our office will contact you regarding appointment(s) once made. Continue to work on lifestyle changes as discussed (low fat, low carb, increased protein diet; improved exercise efforts; weight loss) to control sugar, blood pressure and cholesterol levels and/or reduce risk of developing other medical problems. Look into [email or web address] or other type of food journal to assist you in this process. Please schedule followup in 6-12 months for weight check, call sooner if problems. Your goal is to be down at least 10#!!!

## 2012-09-19 NOTE — Progress Notes (Signed)
Subjective:    Patient ID: Colleen Walsh, female    DOB: [birth year], 62 y.o.   MRN: [id number]  HPI  Here for medicare wellness  Diet: heart healthy  Physical activity: sedentary Depression/mood screen: negative Hearing: intact to whispered voice Visual acuity: grossly normal, performs annual eye exam  ADLs: capable Fall risk: none Home safety: good Cognitive evaluation: intact to orientation, naming, recall and repetition EOL planning: adv directives, full code/ I agree  I have personally reviewed and have noted 1. The patient's medical and social history 2. Their use of alcohol, tobacco or illicit drugs 3. Their current medications and supplements 4. The patient's functional ability including ADL's, fall risks, home safety risks and hearing or visual impairment. 5. Diet and physical activities 6. Evidence for depression or mood disorders  also reviewed chronic medical issues today:  neck pain, chronic - DDD with ?fibromyalgia  Symptoms present >54yr - takes flexeril as needed for same, rare hydrocodone when pain severe, but "Not working anymore" -  Better pain control with flexeril compared to robaxin or tizanidine -?resume same saw ortho spine [place] fall 2011 - "nonoperable"  Then NSurg eval fall 2013 - declined surgical opions because "too risky"  GERD - changed to nexium from dexalant due to dexilant "raising BP" -  BP better and reflux symptoms improved - GI flares dependant on food type choices   HTN - best BP readings in AM- no adverse side effects - trying to watch salt intake - on generic ARB for cost  dyslipidemia - reluctant to take statin feared side effects, family hx adverse rxn to lipitor - rx'd tricor but also causes fatigue. weight gain 2010 summer with adverse effect on same- Paroxysmal intentional wt loss then regains weight enrolled with clinical trial 11/2009-04/2010 in [place] - no meds at this time   Past Medical History  Diagnosis Date  .  VENEREAL WART   . CONTACT DERMATITIS&OTHER ECZEMA DUE UNSPEC CAUSE   . GLUCOSE INTOLERANCE, HX OF   . DEGENERATIVE JOINT DISEASE, CERVICAL SPINE   . HYPERTENSION   . DYSLIPIDEMIA   . GERD    Family History  Problem Relation Age of Onset  . Alcohol abuse Other     parents  . Arthritis Other     grandparent  . Colon cancer Other     parent, other relative  . Hypertension Other     parent, other relative  . Stroke Other     parent, other relative   History  Substance Use Topics  . Smoking status: Never Smoker   . Smokeless tobacco: Not on file     Comment: Married, lives with spouse. disable due to neck pain  . Alcohol Use: No    Review of Systems  Constitutional: Positive for fatigue. Negative for fever.  Respiratory: Negative for cough and shortness of breath.   Cardiovascular: Negative for chest pain.  Neurological: Negative for syncope.  No other specific complaints in a complete review of systems (except as listed in HPI above).      Objective:   Physical Exam BP 140/88  Pulse 72  Temp(Src) 98.3 F (36.8 C) (Oral)  Wt 182 lb 6.4 oz (82.736 kg)  BMI 33.35 kg/m2  SpO2 98% Wt Readings from Last 3 Encounters:  09/19/12 182 lb 6.4 oz (82.736 kg)  06/13/12 188 lb (85.276 kg)  09/08/11 184 lb 12.8 oz (83.825 kg)   Constitutional: She is overweight; appears well-developed and well-nourished. No distress.  Neck: thick, supple, no  thyroid nodules Cardiovascular: Normal rate, regular rhythm and normal heart sounds.  No murmur heard. No BLE edema. Pulmonary/Chest: Effort normal and breath sounds normal. No respiratory distress. She has no wheezes.  MSkel: no gross deformities or effusions Psychiatric: She has a dysmorphic mood and dramatized affect. Judgment and thought content normal.       Lab Results  Component Value Date   WBC 8.9 06/13/2012   HGB 13.2 06/13/2012   HCT 39.4 06/13/2012   PLT 369.0 06/13/2012   CHOL 230* 09/08/2011   TRIG 134.0 09/08/2011   HDL  46.00 09/08/2011   LDLDIRECT 158.6 09/08/2011   ALT 15 09/08/2011   AST 14 09/08/2011   NA 137 06/13/2012   K 4.2 06/13/2012   CL 102 06/13/2012   CREATININE 0.7 06/13/2012   BUN 7 06/13/2012   CO2 24 06/13/2012   TSH 0.95 09/08/2011   HGBA1C 5.3 12/30/2010   ECG: sinus @ 77bpm - no ischemic change or arrythmia   Assessment & Plan:   AWV/CPX/v70.0 - Today patient counseled on age appropriate routine health concerns for screening and prevention, each reviewed and up to date or declined. Immunizations reviewed and up to date or declined. Labs/ECG reviewed. Risk factors for depression reviewed and negative. Hearing function and visual acuity are intact. ADLs screened and addressed as needed. Functional ability and level of safety reviewed and appropriate. Education, counseling and referrals performed based on assessed risks today. Patient provided with a copy of personalized plan for preventive services.  See problem list. Medications and labs reviewed today.  Fatigue - nonspecific symptoms/exam - check screening labs

## 2012-09-20 ENCOUNTER — Telehealth: Payer: Self-pay | Admitting: *Deleted

## 2012-09-20 LAB — LDL CHOLESTEROL, DIRECT: Direct LDL: 189.8 mg/dL

## 2012-09-20 MED ORDER — ATORVASTATIN CALCIUM 20 MG PO TABS: 20.0000 mg | ORAL_TABLET | Freq: Every day | ORAL | Status: DC

## 2012-09-20 NOTE — Telephone Encounter (Signed)
Called pt gave md response concerning her labs. Pt agreed to start the atorvastatin sending rx to [facility]...lmb

## 2012-09-21 ENCOUNTER — Other Ambulatory Visit: Payer: Self-pay | Admitting: *Deleted

## 2012-09-21 MED ORDER — LOSARTAN POTASSIUM 50 MG PO TABS: 50.0000 mg | ORAL_TABLET | Freq: Every day | ORAL | Status: DC

## 2012-10-01 ENCOUNTER — Encounter: Payer: Self-pay | Admitting: Physical Medicine & Rehabilitation

## 2012-10-26 ENCOUNTER — Ambulatory Visit: Payer: Medicare Other | Admitting: Physical Medicine & Rehabilitation

## 2012-12-31 ENCOUNTER — Encounter: Payer: Self-pay | Admitting: Family Medicine

## 2012-12-31 ENCOUNTER — Ambulatory Visit (INDEPENDENT_AMBULATORY_CARE_PROVIDER_SITE_OTHER): Payer: Medicare Other | Admitting: Family Medicine

## 2012-12-31 VITALS — BP 142/90 | HR 96 | Temp 100.3°F

## 2012-12-31 DIAGNOSIS — J329 Chronic sinusitis, unspecified: Secondary | ICD-10-CM

## 2012-12-31 MED ORDER — CEPHALEXIN 500 MG PO CAPS: 500.0000 mg | ORAL_CAPSULE | Freq: Two times a day (BID) | ORAL | Status: DC

## 2012-12-31 MED ORDER — BENZONATATE 200 MG PO CAPS: 200.0000 mg | ORAL_CAPSULE | Freq: Three times a day (TID) | ORAL | Status: DC | PRN

## 2012-12-31 NOTE — Progress Notes (Signed)
SUBJECTIVE:  Colleen Walsh is a 62 y.o. female who complains of coryza, congestion, sore throat, swollen glands, post nasal drip, productive cough, myalgias, headache and bilateral sinus pain for 5 days. She denies a history of anorexia, chest pain, dizziness, shortness of breath, vomiting, weakness and weight loss and denies a history of asthma. Patient denies smoke cigarettes. Positive sick contacts and no flu shot this year.   OBJECTIVE: Blood pressure 142/90, pulse 96, temperature 100.3 F (37.9 C), temperature source Oral, SpO2 98.00%.  She appears mildly ill, vital signs are as noted. Ears normal.  Throat and pharynx moderate erythema with postnasal drip.  Neck supple. Anterior cervical adenopathy in the neck. Nose is congested. Sinuses  tender. The chest is clear, without wheezes or rales.  ASSESSMENT:  viral upper respiratory illness and sinusitis  PLAN: Symptomatic therapy suggested: push fluids, rest and return office visit prn if symptoms persist or worsen. Given way and see antibiotic of Keflex. Call or return to clinic prn if these symptoms worsen or fail to improve as anticipated. Discussed coming back again after illness and getting flu shot.

## 2012-12-31 NOTE — Progress Notes (Signed)
Pre-visit discussion using our clinic review tool. No additional management support is needed unless otherwise documented below in the visit note.  

## 2012-12-31 NOTE — Patient Instructions (Signed)
Very nice to meet you You likely have a virus infection but will give you an antibiotic if not better in next 24 hours Keflex twice daily for 7 days/.  Jerilynn Som for you cough If you get worse in the next 48 hours please come back again.  Sinusitis Sinusitis is redness, soreness, and swelling (inflammation) of the paranasal sinuses. Paranasal sinuses are air pockets within the bones of your face (beneath the eyes, the middle of the forehead, or above the eyes). In healthy paranasal sinuses, mucus is able to drain out, and air is able to circulate through them by way of your nose. However, when your paranasal sinuses are inflamed, mucus and air can become trapped. This can allow bacteria and other germs to grow and cause infection. Sinusitis can develop quickly and last only a short time (acute) or continue over a long period (chronic). Sinusitis that lasts for more than 12 weeks is considered chronic.  CAUSES  Causes of sinusitis include:  Allergies.  Structural abnormalities, such as displacement of the cartilage that separates your nostrils (deviated septum), which can decrease the air flow through your nose and sinuses and affect sinus drainage.  Functional abnormalities, such as when the small hairs (cilia) that line your sinuses and help remove mucus do not work properly or are not present. SYMPTOMS  Symptoms of acute and chronic sinusitis are the same. The primary symptoms are pain and pressure around the affected sinuses. Other symptoms include:  Upper toothache.  Earache.  Headache.  Bad breath.  Decreased sense of smell and taste.  A cough, which worsens when you are lying flat.  Fatigue.  Fever.  Thick drainage from your nose, which often is green and may contain pus (purulent).  Swelling and warmth over the affected sinuses. DIAGNOSIS  Your caregiver will perform a physical exam. During the exam, your caregiver may:  Look in your nose for signs of abnormal  growths in your nostrils (nasal polyps).  Tap over the affected sinus to check for signs of infection.  View the inside of your sinuses (endoscopy) with a special imaging device with a light attached (endoscope), which is inserted into your sinuses. If your caregiver suspects that you have chronic sinusitis, one or more of the following tests may be recommended:  Allergy tests.  Nasal culture A sample of mucus is taken from your nose and sent to a lab and screened for bacteria.  Nasal cytology A sample of mucus is taken from your nose and examined by your caregiver to determine if your sinusitis is related to an allergy. TREATMENT  Most cases of acute sinusitis are related to a viral infection and will resolve on their own within 10 days. Sometimes medicines are prescribed to help relieve symptoms (pain medicine, decongestants, nasal steroid sprays, or saline sprays).  However, for sinusitis related to a bacterial infection, your caregiver will prescribe antibiotic medicines. These are medicines that will help kill the bacteria causing the infection.  Rarely, sinusitis is caused by a fungal infection. In theses cases, your caregiver will prescribe antifungal medicine. For some cases of chronic sinusitis, surgery is needed. Generally, these are cases in which sinusitis recurs more than 3 times per year, despite other treatments. HOME CARE INSTRUCTIONS   Drink plenty of water. Water helps thin the mucus so your sinuses can drain more easily.  Use a humidifier.  Inhale steam 3 to 4 times a day (for example, sit in the bathroom with the shower running).  Apply a  warm, moist washcloth to your face 3 to 4 times a day, or as directed by your caregiver.  Use saline nasal sprays to help moisten and clean your sinuses.  Take over-the-counter or prescription medicines for pain, discomfort, or fever only as directed by your caregiver. SEEK IMMEDIATE MEDICAL CARE IF:  You have increasing pain or  severe headaches.  You have nausea, vomiting, or drowsiness.  You have swelling around your face.  You have vision problems.  You have a stiff neck.  You have difficulty breathing. MAKE SURE YOU:   Understand these instructions.  Will watch your condition.  Will get help right away if you are not doing well or get worse. Document Released: 12/27/2004 Document Revised: 03/21/2011 Document Reviewed: 01/11/2011 [facility] Patient Information 2014 [place], [place].

## 2013-03-25 ENCOUNTER — Encounter: Payer: Self-pay | Admitting: Gastroenterology

## 2013-04-26 ENCOUNTER — Ambulatory Visit: Payer: Medicare Other | Admitting: Internal Medicine

## 2013-05-02 ENCOUNTER — Ambulatory Visit (INDEPENDENT_AMBULATORY_CARE_PROVIDER_SITE_OTHER): Payer: Medicare Other | Admitting: Internal Medicine

## 2013-05-02 ENCOUNTER — Other Ambulatory Visit (INDEPENDENT_AMBULATORY_CARE_PROVIDER_SITE_OTHER): Payer: Medicare Other

## 2013-05-02 ENCOUNTER — Encounter: Payer: Self-pay | Admitting: Internal Medicine

## 2013-05-02 VITALS — BP 118/82 | HR 82 | Temp 98.1°F | Wt 176.0 lb

## 2013-05-02 DIAGNOSIS — L03011 Cellulitis of right finger: Principal | ICD-10-CM

## 2013-05-02 DIAGNOSIS — Z833 Family history of diabetes mellitus: Secondary | ICD-10-CM

## 2013-05-02 DIAGNOSIS — L03019 Cellulitis of unspecified finger: Secondary | ICD-10-CM

## 2013-05-02 DIAGNOSIS — IMO0001 Reserved for inherently not codable concepts without codable children: Secondary | ICD-10-CM

## 2013-05-02 LAB — HEMOGLOBIN A1C: Hgb A1c MFr Bld: 5.1 % (ref 4.6–6.5)

## 2013-05-02 MED ORDER — CEPHALEXIN 500 MG PO CAPS: 500.0000 mg | ORAL_CAPSULE | Freq: Two times a day (BID) | ORAL | Status: DC

## 2013-05-02 MED ORDER — KETOCONAZOLE 2 % EX CREA: TOPICAL_CREAM | CUTANEOUS | Status: DC

## 2013-05-02 NOTE — Progress Notes (Signed)
   Subjective:    Patient ID: Colleen Walsh, female    DOB: September 16, 1950, 63 y.o.   MRN: [id number]  HPI   Symptoms have been present for 2 months as redness at the nailbed of the fourth right digit. This is associated with intermittent sharp pain. Holding objects for extended periods or hot water  topically does bring on the pain.  There's been redness, swelling, as well as some localized joint stiffness. She has used Tylenol and Advil with suboptimal response.  There is no known trauma; no definite triggers such exposure for prolonged periods to water .  She has no history of diabetes. Her last glucose was within normal limits in June of 2014.   Her brother and 2 maternal aunts have diabetes    Review of Systems   She denies fever, chills, or sweats.  She's had no purulent drainage from the finger.  She denies polyuria, polyphagia, or polydipsia.  She has no rash in the inframammary or groin areas     Objective:   Physical Exam   She appears healthy and well-nourished in no distress  She has no lymphadenopathy about the head neck or axilla. There are no epitrochlear lymph nodes.  Radial artery pulses are excellent  Nail health is also excellent.  There is a classic paronychia of the right fourth finger       Assessment & Plan:  #1 paronychia  #2 family history of diabetes  See orders

## 2013-05-02 NOTE — Patient Instructions (Signed)
Keep the nail bed as dry as possible. Apply Nizoral twice a day until rash is gone & gently blow dry

## 2013-05-02 NOTE — Progress Notes (Signed)
Pre visit review using our clinic review tool, if applicable. No additional management support is needed unless otherwise documented below in the visit note. 

## 2013-05-29 ENCOUNTER — Encounter: Payer: Self-pay | Admitting: Gastroenterology

## 2013-06-17 ENCOUNTER — Ambulatory Visit (INDEPENDENT_AMBULATORY_CARE_PROVIDER_SITE_OTHER): Payer: Medicare Other

## 2013-06-17 DIAGNOSIS — Z2911 Encounter for prophylactic immunotherapy for respiratory syncytial virus (RSV): Secondary | ICD-10-CM

## 2013-06-17 DIAGNOSIS — Z23 Encounter for immunization: Secondary | ICD-10-CM

## 2013-07-05 ENCOUNTER — Ambulatory Visit (AMBULATORY_SURGERY_CENTER): Payer: Self-pay

## 2013-07-05 VITALS — Ht 61.0 in | Wt 173.0 lb

## 2013-07-05 DIAGNOSIS — Z800 Family history of malignant neoplasm of digestive organs: Secondary | ICD-10-CM

## 2013-07-05 MED ORDER — SUPREP BOWEL PREP KIT 17.5-3.13-1.6 GM/177ML PO SOLN: 1.0000 | Freq: Once | ORAL | Status: DC

## 2013-07-05 NOTE — Progress Notes (Signed)
No allergies to eggs or soy No past problems with anesthesia No home oxygen No diet/weight loss meds  Has email  Emmi instructions given for colono

## 2013-07-10 ENCOUNTER — Encounter: Payer: Self-pay | Admitting: Gastroenterology

## 2013-07-18 ENCOUNTER — Encounter: Payer: Self-pay | Admitting: Gastroenterology

## 2013-07-18 ENCOUNTER — Ambulatory Visit (AMBULATORY_SURGERY_CENTER): Payer: Medicare Other | Admitting: Gastroenterology

## 2013-07-18 VITALS — BP 127/76 | HR 66 | Temp 98.5°F | Resp 24 | Ht 61.0 in | Wt 173.0 lb

## 2013-07-18 DIAGNOSIS — K5730 Diverticulosis of large intestine without perforation or abscess without bleeding: Secondary | ICD-10-CM

## 2013-07-18 DIAGNOSIS — Z800 Family history of malignant neoplasm of digestive organs: Secondary | ICD-10-CM

## 2013-07-18 DIAGNOSIS — Z1211 Encounter for screening for malignant neoplasm of colon: Secondary | ICD-10-CM

## 2013-07-18 MED ORDER — SODIUM CHLORIDE 0.9 % IV SOLN: 500.0000 mL | INTRAVENOUS | Status: DC

## 2013-07-18 NOTE — Op Note (Addendum)
[place]  [address] [place], [id number]   COLONOSCOPY PROCEDURE REPORT  PATIENT: Colleen, Walsh  MR#: [id number] BIRTHDATE: Jan 06, 1951 , 62  yrs. old GENDER: Female ENDOSCOPIST: Inda Castle, MD REFERRED BY: PROCEDURE DATE:  07/18/2013 PROCEDURE:   Colonoscopy, diagnostic First Screening Colonoscopy - Avg.  risk and is 50 yrs.  old or older - No.  Prior Negative Screening - Now for repeat screening. N/A  History of Adenoma - Now for follow-up colonoscopy & has been > or = to 3 yrs.  N/A  Polyps Removed Today? No.  Recommend repeat exam, <10 yrs? Yes.  High risk (family or personal hx). ASA CLASS:   Class II INDICATIONS:Patient's immediate family history of colon cancer, father and brother MEDICATIONS: MAC sedation, administered by CRNA and propofol (Diprivan) 200mg  IV  DESCRIPTION OF PROCEDURE:   After the risks benefits and alternatives of the procedure were thoroughly explained, informed consent was obtained.  A digital rectal exam revealed no abnormalities of the rectum.   The LB [id number]  endoscope was introduced through the anus and advanced to the cecum, which was identified by both the appendix and ileocecal valve. No adverse events experienced.   The quality of the prep was Suprep good  The instrument was then slowly withdrawn as the colon was fully examined.      COLON FINDINGS: Moderate diverticulosis was noted in the sigmoid colon.   The colon was otherwise normal.  There was no diverticulosis, inflammation, polyps or cancers unless previously stated.  Retroflexed views revealed no abnormalities. The time to cecum=2 minutes 01 seconds.  Withdrawal time=7 minutes 13 seconds. The scope was withdrawn and the procedure completed. COMPLICATIONS: There were no complications.  ENDOSCOPIC IMPRESSION: 1.   Moderate diverticulosis was noted in the sigmoid colon 2.   The colon was otherwise normal  RECOMMENDATIONS: Given  your significant family history of colon cancer, you should have a repeat colonoscopy in 5 years   eSigned:  Inda Castle, MD 07/18/2013 12:09 PM   cc: Rowe Clack, MD   PATIENT NAME:  Colleen, Walsh MR#: [id number]

## 2013-07-18 NOTE — Patient Instructions (Signed)
YOU HAD AN ENDOSCOPIC PROCEDURE TODAY AT THE [facility]: Refer to the procedure report that was given to you for any specific questions about what was found during the examination.  If the procedure report does not answer your questions, please call your gastroenterologist to clarify.  If you requested that your care partner not be given the details of your procedure findings, then the procedure report has been included in a sealed envelope for you to review at your convenience later.  YOU SHOULD EXPECT: Some feelings of bloating in the abdomen. Passage of more gas than usual.  Walking can help get rid of the air that was put into your GI tract during the procedure and reduce the bloating. If you had a lower endoscopy (such as a colonoscopy or flexible sigmoidoscopy) you may notice spotting of blood in your stool or on the toilet paper. If you underwent a bowel prep for your procedure, then you may not have a normal bowel movement for a few days.  DIET: Your first meal following the procedure should be a light meal and then it is ok to progress to your normal diet.  A half-sandwich or bowl of soup is an example of a good first meal.  Heavy or fried foods are harder to digest and may make you feel nauseous or bloated.  Likewise meals heavy in dairy and vegetables can cause extra gas to form and this can also increase the bloating.  Drink plenty of fluids but you should avoid alcoholic beverages for 24 hours.  ACTIVITY: Your care partner should take you home directly after the procedure.  You should plan to take it easy, moving slowly for the rest of the day.  You can resume normal activity the day after the procedure however you should NOT DRIVE or use heavy machinery for 24 hours (because of the sedation medicines used during the test).    SYMPTOMS TO REPORT IMMEDIATELY: A gastroenterologist can be reached at any hour.  During normal business hours, 8:30 AM to 5:00 PM Monday through Friday,  call [phone].  After hours and on weekends, please call the GI answering service at [phone] who will take a message and have the physician on call contact you.   Following lower endoscopy (colonoscopy or flexible sigmoidoscopy):  Excessive amounts of blood in the stool  Significant tenderness or worsening of abdominal pains  Swelling of the abdomen that is new, acute  Fever of 100F or higher  FOLLOW UP: If any biopsies were taken you will be contacted by phone or by letter within the next 1-3 weeks.  Call your gastroenterologist if you have not heard about the biopsies in 3 weeks.  Our staff will call the home number listed on your records the next business day following your procedure to check on you and address any questions or concerns that you may have at that time regarding the information given to you following your procedure. This is a courtesy call and so if there is no answer at the home number and we have not heard from you through the emergency physician on call, we will assume that you have returned to your regular daily activities without incident.  SIGNATURES/CONFIDENTIALITY: You and/or your care partner have signed paperwork which will be entered into your electronic medical record.  These signatures attest to the fact that that the information above on your After Visit Summary has been reviewed and is understood.  Full responsibility of the confidentiality of this   discharge information lies with you and/or your care-partner.  Moderate Diverticulosis otherwise normal colonoscopy 5 year recall due to family history

## 2013-07-18 NOTE — Progress Notes (Signed)
A/ox3, pleased with MAC, report to RN 

## 2013-07-19 ENCOUNTER — Telehealth: Payer: Self-pay | Admitting: *Deleted

## 2013-07-19 NOTE — Telephone Encounter (Signed)
No answer, left message to call if questions or concerns. 

## 2013-07-31 ENCOUNTER — Other Ambulatory Visit (HOSPITAL_COMMUNITY): Payer: Self-pay | Admitting: Obstetrics & Gynecology

## 2013-07-31 DIAGNOSIS — Z1231 Encounter for screening mammogram for malignant neoplasm of breast: Secondary | ICD-10-CM

## 2013-09-11 ENCOUNTER — Ambulatory Visit (HOSPITAL_COMMUNITY)
Admission: RE | Admit: 2013-09-11 | Discharge: 2013-09-11 | Disposition: A | Discharge status: Home / Self Care | Payer: Medicare Other | Source: Ambulatory Visit | Attending: Obstetrics & Gynecology | Admitting: Obstetrics & Gynecology

## 2013-09-11 DIAGNOSIS — Z1231 Encounter for screening mammogram for malignant neoplasm of breast: Secondary | ICD-10-CM | POA: Insufficient documentation

## 2013-09-19 ENCOUNTER — Other Ambulatory Visit: Payer: Self-pay

## 2013-09-19 ENCOUNTER — Telehealth: Payer: Self-pay | Admitting: Internal Medicine

## 2013-09-19 MED ORDER — LOSARTAN POTASSIUM 50 MG PO TABS: 50.0000 mg | ORAL_TABLET | Freq: Every day | ORAL | Status: DC

## 2013-09-19 NOTE — Telephone Encounter (Signed)
Patient had to move cpe to December b/c of provider schedule.  She is requesting a refill on losartan to be sent to [facility].

## 2013-09-25 ENCOUNTER — Encounter: Payer: Medicare Other | Admitting: Internal Medicine

## 2013-12-10 ENCOUNTER — Other Ambulatory Visit (INDEPENDENT_AMBULATORY_CARE_PROVIDER_SITE_OTHER): Payer: Medicare Other

## 2013-12-10 ENCOUNTER — Encounter: Payer: Self-pay | Admitting: Internal Medicine

## 2013-12-10 ENCOUNTER — Ambulatory Visit (INDEPENDENT_AMBULATORY_CARE_PROVIDER_SITE_OTHER): Payer: Medicare Other | Admitting: Internal Medicine

## 2013-12-10 VITALS — BP 124/88 | HR 77 | Temp 98.1°F | Ht 61.0 in | Wt 177.0 lb

## 2013-12-10 DIAGNOSIS — E785 Hyperlipidemia, unspecified: Secondary | ICD-10-CM

## 2013-12-10 DIAGNOSIS — R5383 Other fatigue: Secondary | ICD-10-CM

## 2013-12-10 DIAGNOSIS — M47812 Spondylosis without myelopathy or radiculopathy, cervical region: Secondary | ICD-10-CM

## 2013-12-10 DIAGNOSIS — R7302 Impaired glucose tolerance (oral): Secondary | ICD-10-CM

## 2013-12-10 DIAGNOSIS — Z23 Encounter for immunization: Secondary | ICD-10-CM

## 2013-12-10 DIAGNOSIS — I10 Essential (primary) hypertension: Secondary | ICD-10-CM

## 2013-12-10 DIAGNOSIS — Z0000 Encounter for general adult medical examination without abnormal findings: Secondary | ICD-10-CM

## 2013-12-10 LAB — URINALYSIS, ROUTINE W REFLEX MICROSCOPIC
Bilirubin Urine: NEGATIVE
HGB URINE DIPSTICK: NEGATIVE
Ketones, ur: NEGATIVE
Leukocytes, UA: NEGATIVE
NITRITE: NEGATIVE
PH: 7.5 (ref 5.0–8.0)
RBC / HPF: NONE SEEN (ref 0–?)
Specific Gravity, Urine: 1.01 (ref 1.000–1.030)
Total Protein, Urine: NEGATIVE
Urine Glucose: NEGATIVE
Urobilinogen, UA: 0.2 (ref 0.0–1.0)
WBC UA: NONE SEEN (ref 0–?)

## 2013-12-10 LAB — LIPID PANEL
CHOLESTEROL: 238 mg/dL — AB (ref 0–200)
HDL: 39.9 mg/dL (ref >39.00)
LDL Cholesterol: 163 mg/dL — ABNORMAL HIGH (ref 0–99)
NonHDL: 198.1
TRIGLYCERIDES: 175 mg/dL — AB (ref 0.0–149.0)
Total CHOL/HDL Ratio: 6
VLDL: 35 mg/dL (ref 0.0–40.0)

## 2013-12-10 LAB — BASIC METABOLIC PANEL
BUN: 6 mg/dL (ref 6–23)
CALCIUM: 9.5 mg/dL (ref 8.4–10.5)
CO2: 26 meq/L (ref 19–32)
CREATININE: 0.7 mg/dL (ref 0.4–1.2)
Chloride: 102 mEq/L (ref 96–112)
GFR: 90.4 mL/min (ref >60.00)
Glucose, Bld: 83 mg/dL (ref 70–99)
Potassium: 4.3 mEq/L (ref 3.5–5.1)
Sodium: 135 mEq/L (ref 135–145)

## 2013-12-10 LAB — CBC WITH DIFFERENTIAL/PLATELET
BASOS ABS: 0 10*3/uL (ref 0.0–0.1)
Basophils Relative: 0.7 % (ref 0.0–3.0)
Eosinophils Absolute: 0.1 10*3/uL (ref 0.0–0.7)
Eosinophils Relative: 1.5 % (ref 0.0–5.0)
HCT: 41.3 % (ref 36.0–46.0)
Hemoglobin: 13.8 g/dL (ref 12.0–15.0)
LYMPHS ABS: 2.4 10*3/uL (ref 0.7–4.0)
Lymphocytes Relative: 40.1 % (ref 12.0–46.0)
MCHC: 33.5 g/dL (ref 30.0–36.0)
MCV: 90.8 fl (ref 78.0–100.0)
MONO ABS: 0.5 10*3/uL (ref 0.1–1.0)
MONOS PCT: 8.5 % (ref 3.0–12.0)
NEUTROS PCT: 49.2 % (ref 43.0–77.0)
Neutro Abs: 3 10*3/uL (ref 1.4–7.7)
PLATELETS: 373 10*3/uL (ref 150.0–400.0)
RBC: 4.55 Mil/uL (ref 3.87–5.11)
RDW: 13.2 % (ref 11.5–15.5)
WBC: 6 10*3/uL (ref 4.0–10.5)

## 2013-12-10 LAB — HEPATIC FUNCTION PANEL
ALT: 11 U/L (ref 0–35)
AST: 14 U/L (ref 0–37)
Albumin: 4.2 g/dL (ref 3.5–5.2)
Alkaline Phosphatase: 89 U/L (ref 39–117)
BILIRUBIN TOTAL: 0.6 mg/dL (ref 0.2–1.2)
Bilirubin, Direct: 0 mg/dL (ref 0.0–0.3)
Total Protein: 7.7 g/dL (ref 6.0–8.3)

## 2013-12-10 LAB — TSH: TSH: 1.36 u[IU]/mL (ref 0.35–4.50)

## 2013-12-10 LAB — HEMOGLOBIN A1C: HEMOGLOBIN A1C: 5.4 % (ref 4.6–6.5)

## 2013-12-10 MED ORDER — LOSARTAN POTASSIUM 50 MG PO TABS: 50.0000 mg | ORAL_TABLET | Freq: Every day | ORAL | Status: DC

## 2013-12-10 MED ORDER — CELECOXIB 200 MG PO CAPS: 200.0000 mg | ORAL_CAPSULE | Freq: Two times a day (BID) | ORAL | Status: DC

## 2013-12-10 MED ORDER — CYCLOBENZAPRINE HCL 10 MG PO TABS: 10.0000 mg | ORAL_TABLET | Freq: Two times a day (BID) | ORAL | Status: DC | PRN

## 2013-12-10 NOTE — Progress Notes (Signed)
Pre visit review using our clinic review tool, if applicable. No additional management support is needed unless otherwise documented below in the visit note. 

## 2013-12-10 NOTE — Patient Instructions (Addendum)
It was good to see you today.  We have reviewed your prior records including labs and tests today  Health Maintenance reviewed - Tdap (tetanus diphtheria and pertussis ) updated today. All other recommended immunizations and age-appropriate screenings are up-to-date.  Test(s) ordered today. Your results will be released to Theresa (or called to you) after review, usually within 72hours after test completion. If any changes need to be made, you will be notified at that same time.  Medications reviewed and updated We will add Celebrex as needed for control of arthritis pain symptoms, no other changes recommended at this time.  Your prescription(s) have been submitted to your pharmacy. Please take as directed and contact our office if you believe you are having problem(s) with the medication(s).  Please schedule followup in 12 months for annual exam and labs, call sooner if problems.  Health Maintenance Adopting a healthy lifestyle and getting preventive care can go a long way to promote health and wellness. Talk with your health care provider about what schedule of regular examinations is right for you. This is a good chance for you to check in with your provider about disease prevention and staying healthy. In between checkups, there are plenty of things you can do on your own. Experts have done a lot of research about which lifestyle changes and preventive measures are most likely to keep you healthy. Ask your health care provider for more information. WEIGHT AND DIET  Eat a healthy diet  Be sure to include plenty of vegetables, fruits, low-fat dairy products, and lean protein.  Do not eat a lot of foods high in solid fats, added sugars, or salt.  Get regular exercise. This is one of the most important things you can do for your health.  Most adults should exercise for at least 150 minutes each week. The exercise should increase your heart rate and make you sweat (moderate-intensity  exercise).  Most adults should also do strengthening exercises at least twice a week. This is in addition to the moderate-intensity exercise.  Maintain a healthy weight  Body mass index (BMI) is a measurement that can be used to identify possible weight problems. It estimates body fat based on height and weight. Your health care provider can help determine your BMI and help you achieve or maintain a healthy weight.  For females 6 years of age and older:   A BMI below 18.5 is considered underweight.  A BMI of 18.5 to 24.9 is normal.  A BMI of 25 to 29.9 is considered overweight.  A BMI of 30 and above is considered obese.  Watch levels of cholesterol and blood lipids  You should start having your blood tested for lipids and cholesterol at 61 years of age, then have this test every 5 years.  You may need to have your cholesterol levels checked more often if:  Your lipid or cholesterol levels are high.  You are older than 61 years of age.  You are at high risk for heart disease.  CANCER SCREENING   Lung Cancer  Lung cancer screening is recommended for adults 42-58 years old who are at high risk for lung cancer because of a history of smoking.  A yearly low-dose CT scan of the lungs is recommended for people who:  Currently smoke.  Have quit within the past 15 years.  Have at least a 30-pack-year history of smoking. A pack year is smoking an average of one pack of cigarettes a day for 1 year.  Yearly screening should continue until it has been 15 years since you quit.  Yearly screening should stop if you develop a health problem that would prevent you from having lung cancer treatment.  Breast Cancer  Practice breast self-awareness. This means understanding how your breasts normally appear and feel.  It also means doing regular breast self-exams. Let your health care provider know about any changes, no matter how small.  If you are in your 20s or 30s, you should  have a clinical breast exam (CBE) by a health care provider every 1-3 years as part of a regular health exam.  If you are 83 or older, have a CBE every year. Also consider having a breast X-ray (mammogram) every year.  If you have a family history of breast cancer, talk to your health care provider about genetic screening.  If you are at high risk for breast cancer, talk to your health care provider about having an MRI and a mammogram every year.  Breast cancer gene (BRCA) assessment is recommended for women who have family members with BRCA-related cancers. BRCA-related cancers include:  Breast.  Ovarian.  Tubal.  Peritoneal cancers.  Results of the assessment will determine the need for genetic counseling and BRCA1 and BRCA2 testing. Cervical Cancer Routine pelvic examinations to screen for cervical cancer are no longer recommended for nonpregnant women who are considered low risk for cancer of the pelvic organs (ovaries, uterus, and vagina) and who do not have symptoms. A pelvic examination may be necessary if you have symptoms including those associated with pelvic infections. Ask your health care provider if a screening pelvic exam is right for you.   The Pap test is the screening test for cervical cancer for women who are considered at risk.  If you had a hysterectomy for a problem that was not cancer or a condition that could lead to cancer, then you no longer need Pap tests.  If you are older than 65 years, and you have had normal Pap tests for the past 10 years, you no longer need to have Pap tests.  If you have had past treatment for cervical cancer or a condition that could lead to cancer, you need Pap tests and screening for cancer for at least 20 years after your treatment.  If you no longer get a Pap test, assess your risk factors if they change (such as having a new sexual partner). This can affect whether you should start being screened again.  Some women have medical  problems that increase their chance of getting cervical cancer. If this is the case for you, your health care provider may recommend more frequent screening and Pap tests.  The human papillomavirus (HPV) test is another test that may be used for cervical cancer screening. The HPV test looks for the virus that can cause cell changes in the cervix. The cells collected during the Pap test can be tested for HPV.  The HPV test can be used to screen women 22 years of age and older. Getting tested for HPV can extend the interval between normal Pap tests from three to five years.  An HPV test also should be used to screen women of any age who have unclear Pap test results.  After 61 years of age, women should have HPV testing as often as Pap tests.  Colorectal Cancer  This type of cancer can be detected and often prevented.  Routine colorectal cancer screening usually begins at 61 years of age and continues  through 61 years of age.  Your health care provider may recommend screening at an earlier age if you have risk factors for colon cancer.  Your health care provider may also recommend using home test kits to check for hidden blood in the stool.  A small camera at the end of a tube can be used to examine your colon directly (sigmoidoscopy or colonoscopy). This is done to check for the earliest forms of colorectal cancer.  Routine screening usually begins at age 75.  Direct examination of the colon should be repeated every 5-10 years through 61 years of age. However, you may need to be screened more often if early forms of precancerous polyps or small growths are found. Skin Cancer  Check your skin from head to toe regularly.  Tell your health care provider about any new moles or changes in moles, especially if there is a change in a mole's shape or color.  Also tell your health care provider if you have a mole that is larger than the size of a pencil eraser.  Always use sunscreen. Apply  sunscreen liberally and repeatedly throughout the day.  Protect yourself by wearing long sleeves, pants, a wide-brimmed hat, and sunglasses whenever you are outside. HEART DISEASE, DIABETES, AND HIGH BLOOD PRESSURE   Have your blood pressure checked at least every 1-2 years. High blood pressure causes heart disease and increases the risk of stroke.  If you are between 65 years and 57 years old, ask your health care provider if you should take aspirin to prevent strokes.  Have regular diabetes screenings. This involves taking a blood sample to check your fasting blood sugar level.  If you are at a normal weight and have a low risk for diabetes, have this test once every three years after 61 years of age.  If you are overweight and have a high risk for diabetes, consider being tested at a younger age or more often. PREVENTING INFECTION  Hepatitis B  If you have a higher risk for hepatitis B, you should be screened for this virus. You are considered at high risk for hepatitis B if:  You were born in a country where hepatitis B is common. Ask your health care provider which countries are considered high risk.  Your parents were born in a high-risk country, and you have not been immunized against hepatitis B (hepatitis B vaccine).  You have HIV or AIDS.  You use needles to inject street drugs.  You live with someone who has hepatitis B.  You have had sex with someone who has hepatitis B.  You get hemodialysis treatment.  You take certain medicines for conditions, including cancer, organ transplantation, and autoimmune conditions. Hepatitis C  Blood testing is recommended for:  Everyone born from [birth year] through 1963.  Anyone with known risk factors for hepatitis C. Sexually transmitted infections (STIs)  You should be screened for sexually transmitted infections (STIs) including gonorrhea and chlamydia if:  You are sexually active and are younger than 61 years of age.  You are  older than 61 years of age and your health care provider tells you that you are at risk for this type of infection.  Your sexual activity has changed since you were last screened and you are at an increased risk for chlamydia or gonorrhea. Ask your health care provider if you are at risk.  If you do not have HIV, but are at risk, it may be recommended that you take a prescription medicine daily  to prevent HIV infection. This is called pre-exposure prophylaxis (PrEP). You are considered at risk if:  You are sexually active and do not regularly use condoms or know the HIV status of your partner(s).  You take drugs by injection.  You are sexually active with a partner who has HIV. Talk with your health care provider about whether you are at high risk of being infected with HIV. If you choose to begin PrEP, you should first be tested for HIV. You should then be tested every 3 months for as long as you are taking PrEP.  PREGNANCY   If you are premenopausal and you may become pregnant, ask your health care provider about preconception counseling.  If you may become pregnant, take 400 to 800 micrograms (mcg) of folic acid every day.  If you want to prevent pregnancy, talk to your health care provider about birth control (contraception). OSTEOPOROSIS AND MENOPAUSE   Osteoporosis is a disease in which the bones lose minerals and strength with aging. This can result in serious bone fractures. Your risk for osteoporosis can be identified using a bone density scan.  If you are 75 years of age or older, or if you are at risk for osteoporosis and fractures, ask your health care provider if you should be screened.  Ask your health care provider whether you should take a calcium or vitamin D supplement to lower your risk for osteoporosis.  Menopause may have certain physical symptoms and risks.  Hormone replacement therapy may reduce some of these symptoms and risks. Talk to your health care provider  about whether hormone replacement therapy is right for you.  HOME CARE INSTRUCTIONS   Schedule regular health, dental, and eye exams.  Stay current with your immunizations.   Do not use any tobacco products including cigarettes, chewing tobacco, or electronic cigarettes.  If you are pregnant, do not drink alcohol.  If you are breastfeeding, limit how much and how often you drink alcohol.  Limit alcohol intake to no more than 1 drink per day for nonpregnant women. One drink equals 12 ounces of beer, 5 ounces of wine, or 1 ounces of hard liquor.  Do not use street drugs.  Do not share needles.  Ask your health care provider for help if you need support or information about quitting drugs.  Tell your health care provider if you often feel depressed.  Tell your health care provider if you have ever been abused or do not feel safe at home. Document Released: 07/12/2010 Document Revised: 05/13/2013 Document Reviewed: 11/28/2012 [facility] Patient Information 2015 [place], [place]. This information is not intended to replace advice given to you by your health care provider. Make sure you discuss any questions you have with your health care provider.

## 2013-12-10 NOTE — Assessment & Plan Note (Signed)
BP Readings from Last 3 Encounters:  12/10/13 124/88  07/18/13 127/76  05/02/13 118/82   The current medical regimen is effective;  continue present plan and medications.

## 2013-12-10 NOTE — Progress Notes (Signed)
Subjective:    Patient ID: Colleen Walsh, female    DOB: 12-29-1950, 63 y.o.   MRN: [id number]  HPI   Here for medicare wellness  Diet: heart healthy  Physical activity: sedentary Depression/mood screen: negative Hearing: intact to whispered voice Visual acuity: grossly normal, performs annual eye exam  ADLs: capable Fall risk: none Home safety: good Cognitive evaluation: intact to orientation, naming, recall and repetition EOL planning: adv directives, full code/ I agree  I have personally reviewed and have noted 1. The patient's medical and social history 2. Their use of alcohol, tobacco or illicit drugs 3. Their current medications and supplements 4. The patient's functional ability including ADL's, fall risks, home safety risks and hearing or visual impairment. 5. Diet and physical activities 6. Evidence for depression or mood disorders  Also reviewed chronic medical issues and interval medical events  Past Medical History  Diagnosis Date  . VENEREAL WART   . CONTACT DERMATITIS&OTHER ECZEMA DUE UNSPEC CAUSE   . GLUCOSE INTOLERANCE, HX OF   . DEGENERATIVE JOINT DISEASE, CERVICAL SPINE   . HYPERTENSION   . DYSLIPIDEMIA   . GERD    Family History  Problem Relation Age of Onset  . Alcohol abuse Other     parents  . Arthritis Other     grandparent  . Hypertension Other     parent, other relative  . Stroke Other     parent, other relative  . Colon cancer Father   . Colon cancer Brother    History  Substance Use Topics  . Smoking status: Never Smoker   . Smokeless tobacco: Never Used  . Alcohol Use: No    Review of Systems  Constitutional: Positive for fatigue. Negative for unexpected weight change.  Respiratory: Negative for cough, shortness of breath and wheezing.   Cardiovascular: Negative for chest pain, palpitations and leg swelling.  Gastrointestinal: Negative for nausea, abdominal pain and diarrhea.  Musculoskeletal: Positive for neck  pain (chronic) and neck stiffness (chronic).  Neurological: Negative for dizziness, weakness, light-headedness and headaches.  Psychiatric/Behavioral: Negative for dysphoric mood. The patient is not nervous/anxious.   All other systems reviewed and are negative.      Objective:   Physical Exam  BP 124/88 mmHg  Pulse 77  Temp(Src) 98.1 F (36.7 C) (Oral)  Ht 5\' 1"  (1.549 m)  Wt 177 lb (80.287 kg)  BMI 33.46 kg/m2  SpO2 98% Wt Readings from Last 3 Encounters:  12/10/13 177 lb (80.287 kg)  07/18/13 173 lb (78.472 kg)  07/05/13 173 lb (78.472 kg)   Constitutional: She is overweight, appears well-developed and well-nourished. No distress.  HENT: Head: Normocephalic and atraumatic. Ears: B TMs ok, no erythema or effusion; Nose: Nose normal. Mouth/Throat: Oropharynx is clear and moist. No oropharyngeal exudate.  Eyes: Conjunctivae and EOM are normal. Pupils are equal, round, and reactive to light. No scleral icterus.  Neck: Normal range of motion. Neck supple. No JVD present. No thyromegaly present.  Cardiovascular: Normal rate, regular rhythm and normal heart sounds.  No murmur heard. No BLE edema. Pulmonary/Chest: Effort normal and breath sounds normal. No respiratory distress. She has no wheezes.  Abdominal: Soft. Bowel sounds are normal. She exhibits no distension. There is no tenderness. no masses GU/breast: defer to gyn Musculoskeletal: Normal range of motion, no joint effusions. No gross deformities Neurological: She is alert and oriented to person, place, and time. No cranial nerve deficit. Coordination, balance, strength, speech and gait are normal.  Skin: Skin is warm  and dry. No rash noted. No erythema.  Psychiatric: She has a normal mood and affect. Her behavior is normal. Judgment and thought content normal.    Lab Results  Component Value Date   WBC 8.9 06/13/2012   HGB 13.2 06/13/2012   HCT 39.4 06/13/2012   PLT 369.0 06/13/2012   GLUCOSE 84 06/13/2012   CHOL 251*  09/19/2012   TRIG 116.0 09/19/2012   HDL 41.20 09/19/2012   LDLDIRECT 189.8 09/19/2012   ALT 12 09/19/2012   AST 14 09/19/2012   NA 137 06/13/2012   K 4.2 06/13/2012   CL 102 06/13/2012   CREATININE 0.7 06/13/2012   BUN 7 06/13/2012   CO2 24 06/13/2012   TSH 0.92 09/19/2012   HGBA1C 5.1 05/02/2013    Mm Digital Screening Bilateral  09/12/2013   CLINICAL DATA:  Screening.  EXAM: DIGITAL SCREENING BILATERAL MAMMOGRAM WITH CAD  COMPARISON:  Previous exam(s)  ACR Breast Density Category a: The breast tissue is almost entirely fatty.  FINDINGS: There are no findings suspicious for malignancy. Images were processed with CAD.  IMPRESSION: No mammographic evidence of malignancy. A result letter of this screening mammogram will be mailed directly to the patient.  RECOMMENDATION: Screening mammogram in one year. (Code:SM-B-01Y)  BI-RADS CATEGORY  1: Negative.   Electronically Signed   By: Conchita Paris M.D.   On: 09/12/2013 09:58       Assessment & Plan:   AWV/CPX/z00.00 - Today patient counseled on age appropriate routine health concerns for screening and prevention, each reviewed and up to date or declined. Immunizations reviewed and up to date or declined. Labs ordered and reviewed. Risk factors for depression reviewed and negative. Hearing function and visual acuity are intact. ADLs screened and addressed as needed. Functional ability and level of safety reviewed and appropriate. Education, counseling and referrals performed based on assessed risks today. Patient provided with a copy of personalized plan for preventive services.  Fatigue - nonspecific symptoms/exam - check screening labs  Problem List Items Addressed This Visit    Dyslipidemia    Noncompliant with statin and zetia or any other rx treatment - intolerant of side effects  Encouraged to re-consider compliance with previously rx'd tricor but pt refuses Working on weight loss - encouragement provided Previously in [place] study  11/2009-04/2010 -    Relevant Orders      Lipid panel   Essential hypertension    BP Readings from Last 3 Encounters:  12/10/13 124/88  07/18/13 127/76  05/02/13 118/82   The current medical regimen is effective;  continue present plan and medications.     Relevant Medications      losartan (COZAAR) tablet   Other Relevant Orders      Basic metabolic panel      Urinalysis, Routine w reflex microscopic   Glucose intolerance (impaired glucose tolerance)    The patient is asked to make an attempt to improve diet and exercise patterns to aid in medical management of this problem. Check a1c and encouraged low carb diet as ongoing Lab Results  Component Value Date   HGBA1C 5.1 05/02/2013       Relevant Orders      Basic metabolic panel      Lipid panel      Hemoglobin A1c   Osteoarthritis of spine    Chronic pain - uses prn vicodin and flexeril for same - ongoing since 1990s Eval by ortho spine 2010 (tooke): "non operable" - Nsurg eval Fall 2013 [facility]): pt  declined surgical intervention due to risks Patient reports symptoms not controlled with current dose of hydrocodone, but resolves/controlled with use of celebrex 200 prn - will rx same Also continue muscle relaxer prn - refill today -      Relevant Medications      celecoxib (CELEBREX) capsule      cyclobenzaprine (FLEXERIL) tablet    Other Visit Diagnoses    Routine general medical examination at a health care facility    -  Primary    Other fatigue        Relevant Orders       Basic metabolic panel       CBC with Differential       Hepatic function panel       TSH

## 2013-12-10 NOTE — Assessment & Plan Note (Signed)
Chronic pain - uses prn vicodin and flexeril for same - ongoing since 1990s Eval by ortho spine 2010 (tooke): "non operable" - Nsurg eval Fall 2013 [facility] Gammon): pt declined surgical intervention due to risks Patient reports symptoms not controlled with current dose of hydrocodone, but resolves/controlled with use of celebrex 200 prn - will rx same Also continue muscle relaxer prn - refill today -

## 2013-12-10 NOTE — Assessment & Plan Note (Signed)
Noncompliant with statin and zetia or any other rx treatment - intolerant of side effects  Encouraged to re-consider compliance with previously rx'd tricor but pt refuses Working on weight loss - encouragement provided Previously in [place] study 11/2009-04/2010 -

## 2013-12-10 NOTE — Assessment & Plan Note (Signed)
The patient is asked to make an attempt to improve diet and exercise patterns to aid in medical management of this problem. Check a1c and encouraged low carb diet as ongoing Lab Results  Component Value Date   HGBA1C 5.1 05/02/2013

## 2013-12-16 ENCOUNTER — Telehealth: Payer: Self-pay | Admitting: Internal Medicine

## 2013-12-16 NOTE — Telephone Encounter (Signed)
Pt had labs done 12/1, results are not on my chart, pt inquiring about labs.

## 2013-12-16 NOTE — Telephone Encounter (Signed)
Spoke to pt and gave lab results and also send my chart message with the number to the IT my chart help desk.

## 2013-12-26 ENCOUNTER — Telehealth: Payer: Self-pay | Admitting: *Deleted

## 2013-12-26 NOTE — Telephone Encounter (Signed)
[facility] - Client [facility] Patient Name: Colleen Walsh Gender: Female DOB: 1950/01/13 Age: 63 Y 75 M 9 D Return Phone Number: [phone] (Primary) Address: City/State/Zip: [place] [address] Client [facility] - Client Client Site [place] - Day Physician Gwendolyn Grant Contact Type Call Call Type Triage / Clinical Relationship To Patient Self Return Phone Number [phone] (Primary) Chief Complaint Immunization Reaction Initial Comment Caller states received tetanus shot on December 1. Since, when taking pain medication her BP goes up. PreDisposition Home Care Nurse Assessment Nurse: Gershon Mussel, RN, Caryl Pina Date/Time Eilene Ghazi Time): 12/25/2013 5:01:46 PM Confirm and document reason for call. If symptomatic, describe symptoms. ---Caller states: "received tetanus shot on December 1. Since, when taking pain medication her BP goes up. I am borderline high BP. Been taking pain medication for bulging disc. Last time I took BP was a week ago. I feel like my BP is elevated. Taking BP medication Losartan." Has the patient traveled out of the country within the last 30 days? ---Not Applicable Does the patient require triage? ---Yes Related visit to physician within the last 2 weeks? ---No Does the PT have any chronic conditions? (i.e. diabetes, asthma, etc.) ---Yes List chronic conditions. ---HTN high cholesterol Prediabetic Guidelines Guideline Title Affirmed Question Affirmed Notes Nurse Date/Time (Eastern Time) High Blood Pressure [1] Taking BP medications AND [2] feels is having side effects (e.g., impotence, cough, dizzy upon standing) Eyes are dry and feel that her temperature is elevated Gershon Mussel, RN, Caryl Pina 12/25/2013 5:05:38 PM Disp. Time Eilene Ghazi Time) Disposition Final User 12/25/2013 5:08:59 PM See PCP When Office is Open (within 3 days) Yes Moses, RN,  Caryl Pina PLEASE NOTE: All timestamps contained within this report are represented as [place] Standard Time. CONFIDENTIALTY NOTICE: This fax transmission is intended only for the addressee. It contains information that is legally privileged, confidential or otherwise protected from use or disclosure. If you are not the intended recipient, you are strictly prohibited from reviewing, disclosing, copying using or disseminating any of this information or taking any action in reliance on or regarding this information. If you have received this fax in error, please notify us immediately by telephone so that we can arrange for its return to [place]. Phone: [phone], Toll-Free: [phone], Fax: [phone] Page: 2 of 2 Call Id: [id number] Caller Understands: Yes Disagree/Comply: Comply Care Advice Given Per Guideline SEE PCP WITHIN 3 DAYS: You need to be examined within 2 or 3 days. Call your doctor during regular office hours and make an appointment. (Note: if office will be open tomorrow, tell caller to call then, not in 3 days). HYPERTENSION MEDICATIONS: * Untreated hypertension may cause damage to the heart, brain, kidneys, and eyes. * It is important to take prescribed medications as directed. * The goal of blood pressure treatment for most patients with hypertension is to keep the blood pressure under 140/90. CALL BACK IF: * Weakness or numbness of the face, arm or leg on one side of the body occurs * Difficulty walking, difficulty talking, or severe headache occurs * Chest pain or difficulty breathing occurs * You become worse. CARE ADVICE given per High Blood Pressure (Adult) guideline. After Care Instructions Given Call Event Type User Date / Time D

## 2013-12-27 ENCOUNTER — Encounter: Payer: Self-pay | Admitting: Internal Medicine

## 2013-12-27 ENCOUNTER — Ambulatory Visit (INDEPENDENT_AMBULATORY_CARE_PROVIDER_SITE_OTHER): Payer: Medicare Other | Admitting: Internal Medicine

## 2013-12-27 VITALS — BP 130/86 | HR 81 | Temp 98.3°F | Wt 173.0 lb

## 2013-12-27 DIAGNOSIS — I10 Essential (primary) hypertension: Secondary | ICD-10-CM

## 2013-12-27 DIAGNOSIS — Z8249 Family history of ischemic heart disease and other diseases of the circulatory system: Secondary | ICD-10-CM

## 2013-12-27 DIAGNOSIS — E785 Hyperlipidemia, unspecified: Secondary | ICD-10-CM

## 2013-12-27 NOTE — Progress Notes (Signed)
Pre visit review using our clinic review tool, if applicable. No additional management support is needed unless otherwise documented below in the visit note. 

## 2013-12-27 NOTE — Progress Notes (Signed)
   Subjective:    Patient ID: Colleen Walsh, female    DOB: May 24, 1950, 63 y.o.   MRN: [id number]  HPI  She believes her blood pressure has been elevated since a tetanus shot she got 12/1.   She has been compliant with her medications without other adverse effects  She states she knows her blood pressure is up when she gets hot & has dry eyes and minor headaches.   She is on a heart healthy low-salt diet ; despite this her most recent triglycerides were 175, LDL was 163 and HDL 39.9  There is a strong family history of coronary disease. Mother had heart attack at 93; she had hypertension and was a smoker.  Her father is in his 47s but he also had hypertension  Maternal aunt had stroke in her 45s. 2 maternal uncles had heart attacks;one @ 74 & one @ 23.     Review of Systems    Chest pain, palpitations, tachycardia, exertional dyspnea, paroxysmal nocturnal dyspnea, claudication or edema are absent.       Objective:   Physical Exam Appears healthy and well-nourished & in no acute distress  No carotid bruits are present.No neck vein distention present at 10 - 15 degrees. Thyroid normal to palpation  Heart rhythm and rate are normal with no gallop or murmur  Chest is clear with no increased work of breathing  There is no evidence of aortic aneurysm or renal artery bruits  Abdomen soft with no organomegaly or masses. No HJR  No clubbing, cyanosis or edema present.  Pedal pulses are intact   No ischemic skin changes are present . Fingernails healthy   Alert and oriented. Strength, tone, DTRs reflexes normal          Assessment & Plan:  See Current Assessment & Plan in Problem List under specific Diagnosis   The pathophysiology of atherosclerosis & premature cardiac disease risk were discussed.  Advanced testing recommended to optimally assess risk & options recommended after 4 mos of TLC.

## 2013-12-27 NOTE — Assessment & Plan Note (Signed)
Blood pressure goals reviewed. No change in meds

## 2013-12-27 NOTE — Patient Instructions (Addendum)
Minimal Blood Pressure Goal= AVERAGE < 140/90;  Ideal is an AVERAGE < 135/85. This AVERAGE should be calculated from @ least 5-7 BP readings taken @ different times of day on different days of week. You should not respond to isolated BP readings , but rather the AVERAGE for that week .Please bring your  blood pressure cuff to office visits to verify that it is reliable.It  can also be checked against the blood pressure device at the pharmacy. Finger or wrist cuffs are not dependable; an arm cuff is.  The most common cause of elevated triglycerides (TG) is the ingestion of sugar from high fructose corn syrup sources added to processed foods & drinks.  Eat a low-fat diet with lots of fruits and vegetables, up to 7-9 servings per day. Consume less than 30 Grams (preferably ZERO) of sugar per day from foods & drinks with High Fructose Corn Syrup (HFCS) sugar as #1,2,3 or # 4 on label.[employer], [employer] do not carry products with HFCS.  Risk of premature heart attack or stroke increases as LDL or BAD cholesterol rises,especially if there is family history of heart attack in males before 74 or women before 15. Advanced lipid testing is necessary to determine your risk &  your LDL goal .Medication (statin) therapy is recommended  if LDL goal not met with nutrition & exercise. Dr Nunzio Cory book Eat, Drink & Be Healthy contains the best  dietary cholesterol information.  Cardiovascular exercise is recommended 30-45 minutes 3-4 times per week. If you're not exercising you should take 6-8 weeks to build up to this level.  Please  schedule fasting Labs after 12-14 weeks of nutrition & exercise

## 2013-12-27 NOTE — Assessment & Plan Note (Signed)
TLC with NMR in 4 mos

## 2013-12-30 ENCOUNTER — Telehealth: Payer: Self-pay | Admitting: Internal Medicine

## 2013-12-30 NOTE — Telephone Encounter (Signed)
emmi mailed  °

## 2014-04-28 ENCOUNTER — Ambulatory Visit: Payer: Medicare Other | Admitting: Internal Medicine

## 2014-07-07 ENCOUNTER — Other Ambulatory Visit (INDEPENDENT_AMBULATORY_CARE_PROVIDER_SITE_OTHER): Payer: PPO

## 2014-07-07 ENCOUNTER — Ambulatory Visit (INDEPENDENT_AMBULATORY_CARE_PROVIDER_SITE_OTHER)
Admission: RE | Admit: 2014-07-07 | Discharge: 2014-07-07 | Disposition: A | Discharge status: Home / Self Care | Payer: PPO | Source: Ambulatory Visit | Attending: Internal Medicine | Admitting: Internal Medicine

## 2014-07-07 ENCOUNTER — Ambulatory Visit (INDEPENDENT_AMBULATORY_CARE_PROVIDER_SITE_OTHER): Payer: PPO | Admitting: Internal Medicine

## 2014-07-07 ENCOUNTER — Encounter: Payer: Self-pay | Admitting: Internal Medicine

## 2014-07-07 VITALS — BP 138/82 | HR 85 | Wt 177.0 lb

## 2014-07-07 DIAGNOSIS — M25561 Pain in right knee: Secondary | ICD-10-CM

## 2014-07-07 DIAGNOSIS — I10 Essential (primary) hypertension: Secondary | ICD-10-CM | POA: Diagnosis not present

## 2014-07-07 LAB — BASIC METABOLIC PANEL
BUN: 9 mg/dL (ref 6–23)
CALCIUM: 9.7 mg/dL (ref 8.4–10.5)
CO2: 26 mEq/L (ref 19–32)
Chloride: 100 mEq/L (ref 96–112)
Creatinine, Ser: 0.71 mg/dL (ref 0.40–1.20)
GFR: 88.76 mL/min (ref >60.00)
Glucose, Bld: 101 mg/dL — ABNORMAL HIGH (ref 70–99)
Potassium: 4.5 mEq/L (ref 3.5–5.1)
Sodium: 133 mEq/L — ABNORMAL LOW (ref 135–145)

## 2014-07-07 LAB — SEDIMENTATION RATE: SED RATE: 21 mm/h (ref 0–22)

## 2014-07-07 LAB — URIC ACID: URIC ACID, SERUM: 4.1 mg/dL (ref 2.4–7.0)

## 2014-07-07 LAB — RHEUMATOID FACTOR: Rhuematoid fact SerPl-aCnc: <10 IU/mL (ref <=14)

## 2014-07-07 MED ORDER — TRAMADOL HCL 50 MG PO TABS: 50.0000 mg | ORAL_TABLET | Freq: Two times a day (BID) | ORAL | Status: DC | PRN

## 2014-07-07 MED ORDER — CELECOXIB 200 MG PO CAPS: 200.0000 mg | ORAL_CAPSULE | Freq: Two times a day (BID) | ORAL | Status: DC

## 2014-07-07 MED ORDER — CYCLOBENZAPRINE HCL 10 MG PO TABS: 10.0000 mg | ORAL_TABLET | Freq: Two times a day (BID) | ORAL | Status: DC | PRN

## 2014-07-07 NOTE — Progress Notes (Signed)
Pre visit review using our clinic review tool, if applicable. No additional management support is needed unless otherwise documented below in the visit note. 

## 2014-07-07 NOTE — Assessment & Plan Note (Signed)
On Losartan 

## 2014-07-07 NOTE — Assessment & Plan Note (Addendum)
6/16 new x2 wks  X ray Celebrex/Tramadol prn Potential benefits of a short/long term opioids use as well as potential risks (i.e. addiction risk, apnea etc) and complications (i.e. Somnolence, constipation and others) were explained to the patient and were aknowledged. Pt has a knee brace Labs Consult Dr Tamala Julian

## 2014-07-07 NOTE — Progress Notes (Signed)
   Subjective:    Patient ID: Colleen Walsh, female    DOB: 07/01/1950, 64 y.o.   MRN: [id number]  Knee Pain  There was no injury mechanism. The pain is present in the right knee. The quality of the pain is described as aching. The pain is moderate. Pertinent negatives include no inability to bear weight. The symptoms are aggravated by weight bearing and movement. She has tried ice, rest and NSAIDs for the symptoms. The treatment provided no relief.  x 2 weeks    Review of Systems  Constitutional: Negative for fatigue.  Respiratory: Negative for chest tightness.   Gastrointestinal: Negative for abdominal pain.  Musculoskeletal: Positive for joint swelling. Negative for arthralgias.   BP 138/82 mmHg  Pulse 85  Wt 177 lb (80.287 kg)  SpO2 97%     Objective:   Physical Exam  Constitutional: She appears well-developed. No distress.  Obese  HENT:  Head: Normocephalic.  Right Ear: External ear normal.  Left Ear: External ear normal.  Nose: Nose normal.  Mouth/Throat: Oropharynx is clear and moist.  Eyes: Conjunctivae are normal. Pupils are equal, round, and reactive to light. Right eye exhibits no discharge. Left eye exhibits no discharge.  Neck: Normal range of motion. Neck supple. No JVD present. No tracheal deviation present. No thyromegaly present.  Cardiovascular: Normal rate, regular rhythm and normal heart sounds.   Pulmonary/Chest: No stridor. No respiratory distress. She has no wheezes.  Abdominal: Soft. Bowel sounds are normal. She exhibits no distension and no mass. There is no tenderness. There is no rebound and no guarding.  Musculoskeletal: She exhibits tenderness. She exhibits no edema.  Lymphadenopathy:    She has no cervical adenopathy.  Neurological: She displays normal reflexes. No cranial nerve deficit. She exhibits normal muscle tone. Coordination normal.  Skin: No rash noted. No erythema.  Psychiatric: She has a normal mood and affect. Her behavior is  normal. Judgment and thought content normal.   R knee is puffy, tender w/ROM lat and medially from the patella       Assessment & Plan:

## 2014-08-08 ENCOUNTER — Other Ambulatory Visit (HOSPITAL_COMMUNITY): Payer: Self-pay | Admitting: Obstetrics & Gynecology

## 2014-08-08 DIAGNOSIS — Z1231 Encounter for screening mammogram for malignant neoplasm of breast: Secondary | ICD-10-CM

## 2014-08-11 ENCOUNTER — Other Ambulatory Visit (INDEPENDENT_AMBULATORY_CARE_PROVIDER_SITE_OTHER): Payer: PPO

## 2014-08-11 ENCOUNTER — Ambulatory Visit (INDEPENDENT_AMBULATORY_CARE_PROVIDER_SITE_OTHER): Payer: PPO | Admitting: Family Medicine

## 2014-08-11 ENCOUNTER — Encounter: Payer: Self-pay | Admitting: Family Medicine

## 2014-08-11 VITALS — BP 118/84 | HR 81 | Ht 61.0 in | Wt 179.0 lb

## 2014-08-11 DIAGNOSIS — S83429A Sprain of lateral collateral ligament of unspecified knee, initial encounter: Secondary | ICD-10-CM | POA: Insufficient documentation

## 2014-08-11 DIAGNOSIS — M25561 Pain in right knee: Secondary | ICD-10-CM

## 2014-08-11 DIAGNOSIS — S83421A Sprain of lateral collateral ligament of right knee, initial encounter: Secondary | ICD-10-CM

## 2014-08-11 HISTORY — DX: Sprain of lateral collateral ligament of unspecified knee, initial encounter: S83.429A

## 2014-08-11 MED ORDER — MELOXICAM 15 MG PO TABS: 15.0000 mg | ORAL_TABLET | Freq: Every day | ORAL | Status: DC

## 2014-08-11 NOTE — Progress Notes (Signed)
[facility] [place] [place], [place] [address] Phone: [phone] Subjective:    I'm seeing this patient by the request  of:  Colleen Grant, MD   CC: Right knee pain  [id number] Colleen Walsh is a 64 y.o. female coming in with complaint of knee pain. Patient has had it for approximately 2 months. Patient states this started with no true injury. Patient states it's more on the anterior lateral aspects of the knee. Does have some instability noted. Patient states that sometimes it feels like it can give out on her. Worse with going up and downstairs or with activity. Denies any nighttime awakening. Denies any radiation or weakness. Patient is concerned because it seems to be getting worse. Rates the severity of pain 8 out of 10. Starting to affect her daily activities.  Past Medical History  Diagnosis Date  . VENEREAL WART   . CONTACT DERMATITIS&OTHER ECZEMA DUE UNSPEC CAUSE   . GLUCOSE INTOLERANCE, HX OF   . DEGENERATIVE JOINT DISEASE, CERVICAL SPINE   . HYPERTENSION   . DYSLIPIDEMIA   . GERD    Past Surgical History  Procedure Laterality Date  . Cholecystectomy  1992  . Mohs surgery  2001  . Broken left foot  2006   History  Substance Use Topics  . Smoking status: Never Smoker   . Smokeless tobacco: Never Used  . Alcohol Use: No   Allergies  Allergen Reactions  . Dexlansoprazole     Raises BP  . Doxycycline     REACTION: rash on hand  . Ergostat [Ergotamine Tartrate]   . Erythromycin     Stomach irritation   . Lisinopril     Subtherapeutic  . Omeprazole     REACTION: nausea  . Pantoprazole Sodium     Subtherapeutic  . Phentermine     Raises BP  . Prilosec [Omeprazole Magnesium]   . Propranolol     Betachron  . Ranitidine     Subtherapeutic  . Statins Other (See Comments)  . Verapamil     REACTION: nausea   Family History  Problem Relation Age of Onset  . Alcohol abuse Other     parents  . Arthritis  Other     grandparent  . Hypertension Other     parent, other relative  . Stroke Other     parent, other relative  . Colon cancer Father   . Colon cancer Brother    Patient did have previous x-rays and show mild degenerative changes.     Past medical history, social, surgical and family history all reviewed in electronic medical record.   Review of Systems: No headache, visual changes, nausea, vomiting, diarrhea, constipation, dizziness, abdominal pain, skin rash, fevers, chills, night sweats, weight loss, swollen lymph nodes, body aches, joint swelling, muscle aches, chest pain, shortness of breath, mood changes.   Objective Blood pressure 118/84, pulse 81, height 5\' 1"  (1.549 m), weight 179 lb (81.194 kg), SpO2 97 %.  General: No apparent distress alert and oriented x3 mood and affect normal, dressed appropriately.  HEENT: Pupils equal, extraocular movements intact  Respiratory: Patient's speak in full sentences and does not appear short of breath  Cardiovascular: No lower extremity edema, non tender, no erythema  Skin: Warm dry intact with no signs of infection or rash on extremities or on axial skeleton.  Abdomen: Soft nontender  Neuro: Cranial nerves II through XII are intact, neurovascularly intact in all extremities with 2+ DTRs and  2+ pulses.  Lymph: No lymphadenopathy of posterior or anterior cervical chain or axillae bilaterally.  Gait normal with good balance and coordination.  MSK:  Non tender with full range of motion and good stability and symmetric strength and tone of shoulders, elbows, wrist, hip, and ankles bilaterally.  Knee: Right  Trace effusion noted. Tender to palpation over the lateral joint line as well as the superior lateral patellofemoral joint ROM full in flexion and extension and lower leg rotation. Laxity of the LCL compared to the contralateral side Positive Mcmurray's, Apley's, and Thessalonian tests. Non painful patellar compression. Patellar  glide without crepitus. Patellar and quadriceps tendons unremarkable. Hamstring and quadriceps strength is normal.   Contralateral knee unremarkable  MSK US performed of: Right knee This study was ordered, performed, and interpreted by Charlann Boxer D.O.  Knee: All structures visualized. Moderate degenerative changes of the joint noted medial and lateral. Patient does have a knee effusion noted. Patient also has injury to the LCL be grade 2 with greater than 35% of tear on the articular side. Patient also has lateral meniscal acute on chronic changes. Patellar Tendon unremarkable on long and transverse views without effusion. No abnormality of prepatellar bursa. LCL and MCL unremarkable on long and transverse views. No abnormality of origin of medial or lateral head of the gastrocnemius.  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  This included exercises focusing on stretching, strengthening, with significant focus on eccentric aspects.  Patient was given vastus medialis oblique strengthening exercises as well as flexion and extension exercises to avoid significant strain on the LCL. Patient also given wall squats and hip abductor strengthening exercises. Proper technique shown and discussed handout in great detail with ATC.  All questions were discussed and answered.      Impression and Recommendations:     This case required medical decision making of moderate complexity.

## 2014-08-11 NOTE — Patient Instructions (Signed)
Nice to meet you Wear the brace as much as you can  Ice knee at least 2x/day, after activity and before bed Rehab exercises 3x/week Try to keep moving as much as you can Try brace with a lot of activity Take the Meloxicam daily for 10days then as needed pennsaid pinkie amount topically 2 times daily as needed.  Consider for underlying arthritis tylenol 325mg  3 times a day Vitamin D 2000 IU daily See me again in 3-4 weeks.

## 2014-08-11 NOTE — Progress Notes (Signed)
Pre visit review using our clinic review tool, if applicable. No additional management support is needed unless otherwise documented below in the visit note. 

## 2014-08-11 NOTE — Assessment & Plan Note (Signed)
Patient did have an injection today and tolerated the procedure well with near complete resolution of pain. Patient is going to be given a brace and work with Product/process development scientist today to learn home exercises in greater detail. Patient given topical as well as oral anti-inflammatory's. Patient come back and see me again in 3 weeks. We will hope that patient has more stability at that time. If continuing have pain we need to consider formal physical therapy versus advanced imaging.

## 2014-08-12 ENCOUNTER — Telehealth: Payer: Self-pay | Admitting: Family Medicine

## 2014-08-12 NOTE — Telephone Encounter (Signed)
Brace that was given to her yesterday is not working. Can you please call her at [phone]

## 2014-08-13 NOTE — Telephone Encounter (Signed)
Left message for patient to call back  

## 2014-09-08 ENCOUNTER — Ambulatory Visit: Payer: PPO | Admitting: Family Medicine

## 2014-09-12 ENCOUNTER — Encounter: Payer: Self-pay | Admitting: Gastroenterology

## 2014-09-16 ENCOUNTER — Ambulatory Visit (HOSPITAL_COMMUNITY)
Admission: RE | Admit: 2014-09-16 | Discharge: 2014-09-16 | Disposition: A | Discharge status: Home / Self Care | Payer: PPO | Source: Ambulatory Visit | Attending: Obstetrics & Gynecology | Admitting: Obstetrics & Gynecology

## 2014-09-16 DIAGNOSIS — Z1231 Encounter for screening mammogram for malignant neoplasm of breast: Secondary | ICD-10-CM | POA: Diagnosis present

## 2014-09-24 ENCOUNTER — Ambulatory Visit (INDEPENDENT_AMBULATORY_CARE_PROVIDER_SITE_OTHER): Payer: PPO | Admitting: Clinical

## 2014-09-24 ENCOUNTER — Encounter (HOSPITAL_COMMUNITY): Payer: Self-pay | Admitting: Clinical

## 2014-09-24 DIAGNOSIS — F331 Major depressive disorder, recurrent, moderate: Secondary | ICD-10-CM | POA: Diagnosis not present

## 2014-09-24 DIAGNOSIS — Z634 Disappearance and death of family member: Secondary | ICD-10-CM

## 2014-09-24 DIAGNOSIS — F4321 Adjustment disorder with depressed mood: Secondary | ICD-10-CM

## 2014-09-24 DIAGNOSIS — Z630 Problems in relationship with spouse or partner: Secondary | ICD-10-CM
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
## 2014-09-25 ENCOUNTER — Telehealth: Payer: Self-pay | Admitting: Internal Medicine

## 2014-09-25 NOTE — Telephone Encounter (Signed)
Pt called in about some hep C message that she received on MY Chart.  I could not figure out what she was talking about.  Do you have any idea what she may be speaking of???

## 2014-09-26 NOTE — Telephone Encounter (Signed)
It is suggested screening per the CDC.  She does not have to have the Hep C screening but it is recommended.  Will you contact pt and let her know?

## 2014-10-21 ENCOUNTER — Ambulatory Visit (HOSPITAL_COMMUNITY): Payer: Self-pay | Admitting: Clinical

## 2014-11-18 ENCOUNTER — Other Ambulatory Visit: Payer: Self-pay

## 2014-11-18 MED ORDER — CYCLOBENZAPRINE HCL 10 MG PO TABS: 10.0000 mg | ORAL_TABLET | Freq: Two times a day (BID) | ORAL | Status: DC | PRN

## 2014-11-19 ENCOUNTER — Telehealth: Payer: Self-pay | Admitting: *Deleted

## 2014-11-19 MED ORDER — CYCLOBENZAPRINE HCL 10 MG PO TABS: 10.0000 mg | ORAL_TABLET | Freq: Two times a day (BID) | ORAL | Status: DC | PRN

## 2014-11-19 NOTE — Telephone Encounter (Signed)
[facility] LMOM stating pt states rx for flexeril was sent yesterday. We did not receive script requesting to be resent...Johny Chess

## 2014-12-15 ENCOUNTER — Encounter: Payer: Self-pay | Admitting: Internal Medicine

## 2014-12-15 ENCOUNTER — Ambulatory Visit (INDEPENDENT_AMBULATORY_CARE_PROVIDER_SITE_OTHER): Payer: PPO | Admitting: Internal Medicine

## 2014-12-15 ENCOUNTER — Other Ambulatory Visit (INDEPENDENT_AMBULATORY_CARE_PROVIDER_SITE_OTHER): Payer: PPO

## 2014-12-15 VITALS — BP 138/84 | HR 87 | Temp 98.0°F | Ht 61.0 in | Wt 179.2 lb

## 2014-12-15 DIAGNOSIS — Z0000 Encounter for general adult medical examination without abnormal findings: Secondary | ICD-10-CM

## 2014-12-15 DIAGNOSIS — E785 Hyperlipidemia, unspecified: Secondary | ICD-10-CM

## 2014-12-15 DIAGNOSIS — M47812 Spondylosis without myelopathy or radiculopathy, cervical region: Secondary | ICD-10-CM

## 2014-12-15 DIAGNOSIS — E669 Obesity, unspecified: Secondary | ICD-10-CM

## 2014-12-15 DIAGNOSIS — R7302 Impaired glucose tolerance (oral): Secondary | ICD-10-CM

## 2014-12-15 DIAGNOSIS — Z23 Encounter for immunization: Secondary | ICD-10-CM

## 2014-12-15 DIAGNOSIS — I10 Essential (primary) hypertension: Secondary | ICD-10-CM

## 2014-12-15 DIAGNOSIS — S83421D Sprain of lateral collateral ligament of right knee, subsequent encounter: Secondary | ICD-10-CM | POA: Diagnosis not present

## 2014-12-15 DIAGNOSIS — Z202 Contact with and (suspected) exposure to infections with a predominantly sexual mode of transmission: Secondary | ICD-10-CM

## 2014-12-15 LAB — BASIC METABOLIC PANEL
BUN: 8 mg/dL (ref 6–23)
CO2: 27 meq/L (ref 19–32)
Calcium: 9.7 mg/dL (ref 8.4–10.5)
Chloride: 101 mEq/L (ref 96–112)
Creatinine, Ser: 0.67 mg/dL (ref 0.40–1.20)
GFR: 94.77 mL/min (ref >60.00)
GLUCOSE: 92 mg/dL (ref 70–99)
Potassium: 4.1 mEq/L (ref 3.5–5.1)
SODIUM: 135 meq/L (ref 135–145)

## 2014-12-15 LAB — LIPID PANEL
CHOL/HDL RATIO: 5
Cholesterol: 210 mg/dL — ABNORMAL HIGH (ref 0–200)
HDL: 43.2 mg/dL (ref >39.00)
LDL Cholesterol: 139 mg/dL — ABNORMAL HIGH (ref 0–99)
NONHDL: 167.02
Triglycerides: 142 mg/dL (ref 0.0–149.0)
VLDL: 28.4 mg/dL (ref 0.0–40.0)

## 2014-12-15 LAB — HIV ANTIBODY (ROUTINE TESTING W REFLEX): HIV 1&2 Ab, 4th Generation: NONREACTIVE

## 2014-12-15 LAB — HEMOGLOBIN A1C: Hgb A1c MFr Bld: 5.2 % (ref 4.6–6.5)

## 2014-12-15 NOTE — Assessment & Plan Note (Signed)
The patient is asked to make an attempt to improve diet and exercise patterns to aid in medical management of this problem. Check a1c and encouraged low carb diet as ongoing Lab Results  Component Value Date   HGBA1C 5.4 12/10/2013

## 2014-12-15 NOTE — Progress Notes (Signed)
Pre visit review using our clinic review tool, if applicable. No additional management support is needed unless otherwise documented below in the visit note. 

## 2014-12-15 NOTE — Progress Notes (Signed)
Subjective:    Patient ID: Colleen Walsh, female    DOB: [birth year], 64 y.o.   MRN: [id number]  HPI   Here for medicare wellness  Diet: heart healthy and low carb Physical activity: sedentary Depression/mood screen: negative Hearing: intact to whispered voice Visual acuity: grossly normal, performs annual eye exam  ADLs: capable Fall risk: none Home safety: good Cognitive evaluation: intact to orientation, naming, recall and repetition EOL planning: adv directives, full code/ I agree  I have personally reviewed and have noted 1. The patient's medical and social history 2. Their use of alcohol, tobacco or illicit drugs 3. Their current medications and supplements 4. The patient's functional ability including ADL's, fall risks, home safety risks and hearing or visual impairment. 5. Diet and physical activities 6. Evidence for depression or mood disorders  Also reviewed chronic medical conditions, interval events and current concerns   Past Medical History  Diagnosis Date  . VENEREAL WART   . CONTACT DERMATITIS&OTHER ECZEMA DUE UNSPEC CAUSE   . GLUCOSE INTOLERANCE, HX OF   . DEGENERATIVE JOINT DISEASE, CERVICAL SPINE   . HYPERTENSION   . DYSLIPIDEMIA   . GERD   . Tear of LCL (lateral collateral ligament) of knee 08/2014    R knee pain, dx on [place], s/p IAC injection   Family History  Problem Relation Age of Onset  . Alcohol abuse Other     parents  . Arthritis Other     grandparent  . Hypertension Other     parent, other relative  . Stroke Other     parent, other relative  . Colon cancer Father   . Colon cancer Brother   . Depression Mother   . Alcohol abuse Mother   . Alcohol abuse Sister   . Depression Sister   . Alcohol abuse Brother   . Depression Brother   . Heart attack Cousin   . Heart attack Mother 58    SMOKER   Social History   Social History  . Marital Status: Married    Spouse Name: N/A  . Number of Children: N/A  . Years of  Education: N/A   Occupational History  . Not on file.   Social History Main Topics  . Smoking status: Never Smoker   . Smokeless tobacco: Never Used  . Alcohol Use: No  . Drug Use: No  . Sexual Activity: Not Currently   Other Topics Concern  . Not on file   Social History Narrative   Married, lives with spouse. disabled due to neck pain    Review of Systems  Constitutional: Negative for fatigue and unexpected weight change.  Respiratory: Negative for cough, shortness of breath and wheezing.   Cardiovascular: Negative for chest pain, palpitations and leg swelling.  Gastrointestinal: Negative for nausea, abdominal pain and diarrhea.  Musculoskeletal: Positive for joint swelling (R knee), arthralgias (R knee) and gait problem (due to R knee pain). Neck pain: chronic.  Neurological: Negative for dizziness, weakness, light-headedness and headaches.  Psychiatric/Behavioral: Negative for dysphoric mood. The patient is not nervous/anxious.   All other systems reviewed and are negative.      Objective:    Physical Exam  Constitutional: She appears well-developed and well-nourished. No distress.  obese  Cardiovascular: Normal rate, regular rhythm and normal heart sounds.   No murmur heard. Pulmonary/Chest: Effort normal and breath sounds normal. No respiratory distress.  Musculoskeletal: She exhibits no edema.  R knee - boggy synovitis - tender to palpation over joint line;  FROM and ligamentous function intact  Vitals reviewed.   BP 138/84 mmHg  Pulse 87  Temp(Src) 98 F (36.7 C) (Oral)  Ht 5\' 1"  (1.549 m)  Wt 179 lb 4 oz (81.307 kg)  BMI 33.89 kg/m2  SpO2 98% Wt Readings from Last 3 Encounters:  12/15/14 179 lb 4 oz (81.307 kg)  08/11/14 179 lb (81.194 kg)  07/07/14 177 lb (80.287 kg)    Lab Results  Component Value Date   WBC 6.0 12/10/2013   HGB 13.8 12/10/2013   HCT 41.3 12/10/2013   PLT 373.0 12/10/2013   GLUCOSE 101* 07/07/2014   CHOL 238* 12/10/2013    TRIG 175.0* 12/10/2013   HDL 39.90 12/10/2013   LDLDIRECT 189.8 09/19/2012   LDLCALC 163* 12/10/2013   ALT 11 12/10/2013   AST 14 12/10/2013   NA 133* 07/07/2014   K 4.5 07/07/2014   CL 100 07/07/2014   CREATININE 0.71 07/07/2014   BUN 9 07/07/2014   CO2 26 07/07/2014   TSH 1.36 12/10/2013   HGBA1C 5.4 12/10/2013    Mm Digital Screening Bilateral  09/17/2014  CLINICAL DATA:  Screening. EXAM: DIGITAL SCREENING BILATERAL MAMMOGRAM WITH CAD COMPARISON:  Previous exam(s). ACR Breast Density Category a: The breast tissue is almost entirely fatty. FINDINGS: There are no findings suspicious for malignancy. Images were processed with CAD. IMPRESSION: No mammographic evidence of malignancy. A result letter of this screening mammogram will be mailed directly to the patient. RECOMMENDATION: Screening mammogram in one year. (Code:SM-B-01Y) BI-RADS CATEGORY  1: Negative. Electronically Signed   By: Altamese Cabal M.D.   On: 09/17/2014 07:44       Assessment & Plan:   AWV/z00.00 - Today patient counseled on age appropriate routine health concerns for screening and prevention, each reviewed and up to date or declined. Immunizations reviewed and up to date or declined. Labs ordered and reviewed. Risk factors for depression reviewed and negative. Hearing function and visual acuity are intact. ADLs screened and addressed as needed. Functional ability and level of safety reviewed and appropriate. Education, counseling and referrals performed based on assessed risks today. Patient provided with a copy of personalized plan for preventive services. Check Hep C given DOB 1952  Problem List Items Addressed This Visit    Dyslipidemia    Noncompliant with statin and zetia or any other rx treatment - intolerant of side effects  Encouraged to re-consider compliance with previously rx'd tricor but pt refuses Working on weight loss - encouragement provided Previously in [place] study 11/2009-04/2010 - Check labs  annually      Relevant Orders   Lipid panel   Essential hypertension    BP Readings from Last 3 Encounters:  12/15/14 138/84  08/11/14 118/84  07/07/14 138/82   The current medical regimen is generally effective;  continue present plan and medications.       Relevant Orders   Basic metabolic panel   Glucose intolerance (impaired glucose tolerance)    The patient is asked to make an attempt to improve diet and exercise patterns to aid in medical management of this problem. Check a1c and encouraged low carb diet as ongoing Lab Results  Component Value Date   HGBA1C 5.4 12/10/2013        Relevant Orders   Hemoglobin [id number]   Basic metabolic panel   Obese    Wt Readings from Last 3 Encounters:  12/15/14 179 lb 4 oz (81.307 kg)  08/11/14 179 lb (81.194 kg)  07/07/14 177 lb (80.287 kg)  Body mass index is 33.89 kg/(m^2). The patient is asked to make an attempt to improve diet and exercise patterns to aid in medical management of this problem. Eval of R knee pain to assist with need for resumed walking efforts (see above)      Osteoarthritis of spine    Chronic neck pain - uses prn vicodin and flexeril for same - ongoing since 1990s Eval by ortho spine 2010 (tooke): "non operable" - Nsurg eval Fall 2013 Trenton Gammon): pt declined surgical intervention due to risks Patient reports symptoms not controlled with tramadol or hydrocodone, but resolves/controlled with use of celebrex 200 or other NSAID prn - will continue rx same Also continue muscle relaxer prn - refill today -        Tear of LCL (lateral collateral ligament) of knee    R knee pain, chronic for past 6 mo Does not recall specific injury - dx same by [place] 08/2014 Charlann Boxer) Improved for 1 week after cortisone injection 08/2014 - Now intermittent swelling inhibiting walking efforts, esp painful climbing stairs Refer to ortho for 2nd opinion - suspect will need MRI, but defer to ortho Continue NSIADs, brace and ice until  lthen      Relevant Orders   Ambulatory referral to Orthopedic Surgery    Other Visit Diagnoses    Routine general medical examination at a health care facility    -  Primary    Need for prophylactic vaccination against Streptococcus pneumoniae (pneumococcus)        Relevant Orders    Pneumococcal conjugate vaccine 13-valent (Completed)    Contact with or exposure to venereal disease        Relevant Orders    Hepatitis C antibody    HIV antibody        Gwendolyn Grant, MD

## 2014-12-15 NOTE — Assessment & Plan Note (Signed)
Chronic neck pain - uses prn vicodin and flexeril for same - ongoing since 1990s Eval by ortho spine 2010 (tooke): "non operable" - Nsurg eval Fall 2013 Colleen Walsh): pt declined surgical intervention due to risks Patient reports symptoms not controlled with tramadol or hydrocodone, but resolves/controlled with use of celebrex 200 or other NSAID prn - will continue rx same Also continue muscle relaxer prn - refill today -

## 2014-12-15 NOTE — Assessment & Plan Note (Signed)
Noncompliant with statin and zetia or any other rx treatment - intolerant of side effects  Encouraged to re-consider compliance with previously rx'd tricor but pt refuses Working on weight loss - encouragement provided Previously in [place] study 11/2009-04/2010 - Check labs annually

## 2014-12-15 NOTE — Assessment & Plan Note (Signed)
Wt Readings from Last 3 Encounters:  12/15/14 179 lb 4 oz (81.307 kg)  08/11/14 179 lb (81.194 kg)  07/07/14 177 lb (80.287 kg)   Body mass index is 33.89 kg/(m^2). The patient is asked to make an attempt to improve diet and exercise patterns to aid in medical management of this problem. Eval of R knee pain to assist with need for resumed walking efforts (see above)

## 2014-12-15 NOTE — Patient Instructions (Addendum)
It was good to see you today. Thank you for my Christmas gift!  We have reviewed your prior records including labs and tests today  Flu shot declined and Prevnar pneumonia vaccine updated today. Other Health Maintenance reviewed and all other recommended immunizations and age-appropriate screenings are up-to-date.  Test(s) ordered today. Your results will be released to [facility] (or called to you) after review, usually within 72hours after test completion. If any changes need to be made, you will be notified at that same time.  Medications reviewed and updated, no changes recommended at this time.  we'll make referral to orthopedics for second opinion of management of your right knee pain and swelling. Our office will contact you regarding appointment(s) once made.  Work on lifestyle changes as discussed (low fat, low carb, increased protein diet; improved exercise efforts; weight loss) to control sugar, blood pressure and cholesterol levels and/or reduce risk of developing other medical problems. Look into [email or web address] or other type of food journal to assist you in this process.  Please schedule followup in 6 months for semiannual exam and labs with Dr. Quay Burow, call sooner if problems.  Health Maintenance, Female Adopting a healthy lifestyle and getting preventive care can go a long way to promote health and wellness. Talk with your health care provider about what schedule of regular examinations is right for you. This is a good chance for you to check in with your provider about disease prevention and staying healthy. In between checkups, there are plenty of things you can do on your own. Experts have done a lot of research about which lifestyle changes and preventive measures are most likely to keep you healthy. Ask your health care provider for more information. WEIGHT AND DIET  Eat a healthy diet  Be sure to include plenty of vegetables, fruits, low-fat dairy products, and lean  protein.  Do not eat a lot of foods high in solid fats, added sugars, or salt.  Get regular exercise. This is one of the most important things you can do for your health.  Most adults should exercise for at least 150 minutes each week. The exercise should increase your heart rate and make you sweat (moderate-intensity exercise).  Most adults should also do strengthening exercises at least twice a week. This is in addition to the moderate-intensity exercise.  Maintain a healthy weight  Body mass index (BMI) is a measurement that can be used to identify possible weight problems. It estimates body fat based on height and weight. Your health care provider can help determine your BMI and help you achieve or maintain a healthy weight.  For females 49 years of age and older:   A BMI below 18.5 is considered underweight.  A BMI of 18.5 to 24.9 is normal.  A BMI of 25 to 29.9 is considered overweight.  A BMI of 30 and above is considered obese.  Watch levels of cholesterol and blood lipids  You should start having your blood tested for lipids and cholesterol at 62 years of age, then have this test every 5 years.  You may need to have your cholesterol levels checked more often if:  Your lipid or cholesterol levels are high.  You are older than 62 years of age.  You are at high risk for heart disease.  CANCER SCREENING   Lung Cancer  Lung cancer screening is recommended for adults 80-58 years old who are at high risk for lung cancer because of a history of smoking.  A yearly  low-dose CT scan of the lungs is recommended for people who:  Currently smoke.  Have quit within the past 15 years.  Have at least a 30-pack-year history of smoking. A pack year is smoking an average of one pack of cigarettes a day for 1 year.  Yearly screening should continue until it has been 15 years since you quit.  Yearly screening should stop if you develop a health problem that would prevent you  from having lung cancer treatment.  Breast Cancer  Practice breast self-awareness. This means understanding how your breasts normally appear and feel.  It also means doing regular breast self-exams. Let your health care provider know about any changes, no matter how small.  If you are in your 20s or 30s, you should have a clinical breast exam (CBE) by a health care provider every 1-3 years as part of a regular health exam.  If you are 35 or older, have a CBE every year. Also consider having a breast X-ray (mammogram) every year.  If you have a family history of breast cancer, talk to your health care provider about genetic screening.  If you are at high risk for breast cancer, talk to your health care provider about having an MRI and a mammogram every year.  Breast cancer gene (BRCA) assessment is recommended for women who have family members with BRCA-related cancers. BRCA-related cancers include:  Breast.  Ovarian.  Tubal.  Peritoneal cancers.  Results of the assessment will determine the need for genetic counseling and BRCA1 and BRCA2 testing. Cervical Cancer Your health care provider may recommend that you be screened regularly for cancer of the pelvic organs (ovaries, uterus, and vagina). This screening involves a pelvic examination, including checking for microscopic changes to the surface of your cervix (Pap test). You may be encouraged to have this screening done every 3 years, beginning at age 36.  For women ages 43-65, health care providers may recommend pelvic exams and Pap testing every 3 years, or they may recommend the Pap and pelvic exam, combined with testing for human papilloma virus (HPV), every 5 years. Some types of HPV increase your risk of cervical cancer. Testing for HPV may also be done on women of any age with unclear Pap test results.  Other health care providers may not recommend any screening for nonpregnant women who are considered low risk for pelvic  cancer and who do not have symptoms. Ask your health care provider if a screening pelvic exam is right for you.  If you have had past treatment for cervical cancer or a condition that could lead to cancer, you need Pap tests and screening for cancer for at least 20 years after your treatment. If Pap tests have been discontinued, your risk factors (such as having a new sexual partner) need to be reassessed to determine if screening should resume. Some women have medical problems that increase the chance of getting cervical cancer. In these cases, your health care provider may recommend more frequent screening and Pap tests. Colorectal Cancer  This type of cancer can be detected and often prevented.  Routine colorectal cancer screening usually begins at 62 years of age and continues through 62 years of age.  Your health care provider may recommend screening at an earlier age if you have risk factors for colon cancer.  Your health care provider may also recommend using home test kits to check for hidden blood in the stool.  A small camera at the end of a tube can  be used to examine your colon directly (sigmoidoscopy or colonoscopy). This is done to check for the earliest forms of colorectal cancer.  Routine screening usually begins at age 46.  Direct examination of the colon should be repeated every 5-10 years through 62 years of age. However, you may need to be screened more often if early forms of precancerous polyps or small growths are found. Skin Cancer  Check your skin from head to toe regularly.  Tell your health care provider about any new moles or changes in moles, especially if there is a change in a mole's shape or color.  Also tell your health care provider if you have a mole that is larger than the size of a pencil eraser.  Always use sunscreen. Apply sunscreen liberally and repeatedly throughout the day.  Protect yourself by wearing long sleeves, pants, a wide-brimmed hat, and  sunglasses whenever you are outside. HEART DISEASE, DIABETES, AND HIGH BLOOD PRESSURE   High blood pressure causes heart disease and increases the risk of stroke. High blood pressure is more likely to develop in:  People who have blood pressure in the high end of the normal range (130-139/85-89 mm Hg).  People who are overweight or obese.  People who are African American.  If you are 61-83 years of age, have your blood pressure checked every 3-5 years. If you are 108 years of age or older, have your blood pressure checked every year. You should have your blood pressure measured twice--once when you are at a hospital or clinic, and once when you are not at a hospital or clinic. Record the average of the two measurements. To check your blood pressure when you are not at a hospital or clinic, you can use:  An automated blood pressure machine at a pharmacy.  A home blood pressure monitor.  If you are between 53 years and 57 years old, ask your health care provider if you should take aspirin to prevent strokes.  Have regular diabetes screenings. This involves taking a blood sample to check your fasting blood sugar level.  If you are at a normal weight and have a low risk for diabetes, have this test once every three years after 62 years of age.  If you are overweight and have a high risk for diabetes, consider being tested at a younger age or more often. PREVENTING INFECTION  Hepatitis B  If you have a higher risk for hepatitis B, you should be screened for this virus. You are considered at high risk for hepatitis B if:  You were born in a country where hepatitis B is common. Ask your health care provider which countries are considered high risk.  Your parents were born in a high-risk country, and you have not been immunized against hepatitis B (hepatitis B vaccine).  You have HIV or AIDS.  You use needles to inject street drugs.  You live with someone who has hepatitis B.  You have  had sex with someone who has hepatitis B.  You get hemodialysis treatment.  You take certain medicines for conditions, including cancer, organ transplantation, and autoimmune conditions. Hepatitis C  Blood testing is recommended for:  Everyone born from [birth year] through 1963.  Anyone with known risk factors for hepatitis C. Sexually transmitted infections (STIs)  You should be screened for sexually transmitted infections (STIs) including gonorrhea and chlamydia if:  You are sexually active and are younger than 62 years of age.  You are older than 62 years of age  and your health care provider tells you that you are at risk for this type of infection.  Your sexual activity has changed since you were last screened and you are at an increased risk for chlamydia or gonorrhea. Ask your health care provider if you are at risk.  If you do not have HIV, but are at risk, it may be recommended that you take a prescription medicine daily to prevent HIV infection. This is called pre-exposure prophylaxis (PrEP). You are considered at risk if:  You are sexually active and do not regularly use condoms or know the HIV status of your partner(s).  You take drugs by injection.  You are sexually active with a partner who has HIV. Talk with your health care provider about whether you are at high risk of being infected with HIV. If you choose to begin PrEP, you should first be tested for HIV. You should then be tested every 3 months for as long as you are taking PrEP.  PREGNANCY   If you are premenopausal and you may become pregnant, ask your health care provider about preconception counseling.  If you may become pregnant, take 400 to 800 micrograms (mcg) of folic acid every day.  If you want to prevent pregnancy, talk to your health care provider about birth control (contraception). OSTEOPOROSIS AND MENOPAUSE   Osteoporosis is a disease in which the bones lose minerals and strength with aging. This can  result in serious bone fractures. Your risk for osteoporosis can be identified using a bone density scan.  If you are 65 years of age or older, or if you are at risk for osteoporosis and fractures, ask your health care provider if you should be screened.  Ask your health care provider whether you should take a calcium or vitamin D supplement to lower your risk for osteoporosis.  Menopause may have certain physical symptoms and risks.  Hormone replacement therapy may reduce some of these symptoms and risks. Talk to your health care provider about whether hormone replacement therapy is right for you.  HOME CARE INSTRUCTIONS   Schedule regular health, dental, and eye exams.  Stay current with your immunizations.   Do not use any tobacco products including cigarettes, chewing tobacco, or electronic cigarettes.  If you are pregnant, do not drink alcohol.  If you are breastfeeding, limit how much and how often you drink alcohol.  Limit alcohol intake to no more than 1 drink per day for nonpregnant women. One drink equals 12 ounces of beer, 5 ounces of wine, or 1 ounces of hard liquor.  Do not use street drugs.  Do not share needles.  Ask your health care provider for help if you need support or information about quitting drugs.  Tell your health care provider if you often feel depressed.  Tell your health care provider if you have ever been abused or do not feel safe at home.   This information is not intended to replace advice given to you by your health care provider. Make sure you discuss any questions you have with your health care provider.   Document Released: 07/12/2010 Document Revised: 01/17/2014 Document Reviewed: 11/28/2012 Elsevier Interactive Patient Education [employer].

## 2014-12-15 NOTE — Assessment & Plan Note (Addendum)
R knee pain, chronic for past 6 mo Does not recall specific injury - dx same by [place] 08/2014 Charlann Boxer) Improved for 1 week after cortisone injection 08/2014 - Now intermittent swelling inhibiting walking efforts, esp painful climbing stairs Refer to ortho for 2nd opinion - suspect will need MRI, but defer to ortho Continue NSIADs, brace and ice until lthen

## 2014-12-15 NOTE — Assessment & Plan Note (Signed)
BP Readings from Last 3 Encounters:  12/15/14 138/84  08/11/14 118/84  07/07/14 138/82   The current medical regimen is generally effective;  continue present plan and medications.

## 2014-12-16 LAB — HEPATITIS C ANTIBODY: HCV Ab: NEGATIVE

## 2014-12-22 ENCOUNTER — Telehealth: Payer: Self-pay | Admitting: Internal Medicine

## 2014-12-22 MED ORDER — LOSARTAN POTASSIUM 50 MG PO TABS: 50.0000 mg | ORAL_TABLET | Freq: Every day | ORAL | Status: DC

## 2014-12-22 NOTE — Telephone Encounter (Signed)
Pt request refill for losartan (COZAAR) 50 MG tablet to be send to [facility].

## 2014-12-22 NOTE — Telephone Encounter (Signed)
erx done

## 2015-01-27 DIAGNOSIS — M1711 Unilateral primary osteoarthritis, right knee: Secondary | ICD-10-CM | POA: Diagnosis not present

## 2015-03-03 DIAGNOSIS — M1711 Unilateral primary osteoarthritis, right knee: Secondary | ICD-10-CM | POA: Diagnosis not present

## 2015-04-16 DIAGNOSIS — H524 Presbyopia: Secondary | ICD-10-CM | POA: Diagnosis not present

## 2015-04-16 DIAGNOSIS — H35033 Hypertensive retinopathy, bilateral: Secondary | ICD-10-CM | POA: Diagnosis not present

## 2015-04-16 DIAGNOSIS — H2513 Age-related nuclear cataract, bilateral: Secondary | ICD-10-CM | POA: Diagnosis not present

## 2015-04-16 DIAGNOSIS — H25013 Cortical age-related cataract, bilateral: Secondary | ICD-10-CM | POA: Diagnosis not present

## 2015-04-16 DIAGNOSIS — H01003 Unspecified blepharitis right eye, unspecified eyelid: Secondary | ICD-10-CM | POA: Diagnosis not present

## 2015-04-16 LAB — HM DIABETES EYE EXAM

## 2015-04-20 DIAGNOSIS — M1711 Unilateral primary osteoarthritis, right knee: Secondary | ICD-10-CM | POA: Diagnosis not present

## 2015-05-18 ENCOUNTER — Encounter: Payer: Self-pay | Admitting: Internal Medicine

## 2015-05-25 DIAGNOSIS — Z85828 Personal history of other malignant neoplasm of skin: Secondary | ICD-10-CM | POA: Diagnosis not present

## 2015-05-25 DIAGNOSIS — Z1283 Encounter for screening for malignant neoplasm of skin: Secondary | ICD-10-CM | POA: Diagnosis not present

## 2015-05-25 DIAGNOSIS — Z08 Encounter for follow-up examination after completed treatment for malignant neoplasm: Secondary | ICD-10-CM | POA: Diagnosis not present

## 2015-06-15 ENCOUNTER — Encounter: Payer: Self-pay | Admitting: Internal Medicine

## 2015-06-15 ENCOUNTER — Ambulatory Visit (INDEPENDENT_AMBULATORY_CARE_PROVIDER_SITE_OTHER): Payer: PPO | Admitting: Internal Medicine

## 2015-06-15 VITALS — BP 152/100 | HR 75 | Temp 98.1°F | Resp 16 | Wt 169.0 lb

## 2015-06-15 DIAGNOSIS — I10 Essential (primary) hypertension: Secondary | ICD-10-CM | POA: Diagnosis not present

## 2015-06-15 DIAGNOSIS — M5136 Other intervertebral disc degeneration, lumbar region: Secondary | ICD-10-CM | POA: Insufficient documentation

## 2015-06-15 DIAGNOSIS — K219 Gastro-esophageal reflux disease without esophagitis: Secondary | ICD-10-CM | POA: Diagnosis not present

## 2015-06-15 DIAGNOSIS — M1711 Unilateral primary osteoarthritis, right knee: Secondary | ICD-10-CM | POA: Insufficient documentation

## 2015-06-15 DIAGNOSIS — M5126 Other intervertebral disc displacement, lumbar region: Secondary | ICD-10-CM | POA: Insufficient documentation

## 2015-06-15 DIAGNOSIS — M1712 Unilateral primary osteoarthritis, left knee: Secondary | ICD-10-CM | POA: Insufficient documentation

## 2015-06-15 MED ORDER — CLOBETASOL PROPIONATE 0.05 % EX GEL: CUTANEOUS | Status: DC

## 2015-06-15 MED ORDER — AMLODIPINE BESYLATE 5 MG PO TABS: 5.0000 mg | ORAL_TABLET | Freq: Every day | ORAL | Status: DC

## 2015-06-15 NOTE — Assessment & Plan Note (Addendum)
BP uncontrolled Stopped taking losartan - felt her BP was elevated and the medication made it worse - has not been checking bp Will officially stop the losartan Start amlodipine - reviewed possible side effects Continue exercise, low salt diet and weight loss efforts Advised her to bring her cuff to her next appt F/u in one week

## 2015-06-15 NOTE — Patient Instructions (Addendum)
   Medications reviewed and updated.  Changes include starting amlodipine for your blood pressure.    Your prescription(s) have been submitted to your pharmacy. Please take as directed and contact our office if you believe you are having problem(s) with the medication(s).  Follow up next week at your already scheduled appointment.

## 2015-06-15 NOTE — Progress Notes (Signed)
Subjective:    Patient ID: Colleen Walsh, female    DOB: 10/07/1950, 65 y.o.   MRN: [id number]  HPI She is here for an acute visit for elevated bp.  Hypertension:  She feels hot and her eyes burn, left ear started to burn last week and she knew her bp was elevated.  She was taking the losartan daily but when she took it last week she felt like it made her BP even higher and stopped taking it.  She last took it last thursday.  She drinks lemon water and it will bring down her bp.  She is exercising regularly.  She is compliant with a low sodium diet.  She has a cuff at home, but does not check her BP regularly at home - it is a wrist cuff and she is not sure if it is accurate.   GERD:  She has been having heartburn more recently.  She has been eating lemon water and it helps with the GERD as well as her BP.  She has not tolerated several GERD medications.   She is exercising and eating well.  She has lost weight.      Medications and allergies reviewed with patient and updated if appropriate.  Patient Active Problem List   Diagnosis Date Noted  . Obese 12/15/2014  . Tear of LCL (lateral collateral ligament) of knee 08/11/2014  . CONTACT DERMATITIS&OTHER ECZEMA DUE UNSPEC CAUSE 11/12/2008  . Dyslipidemia 05/19/2008  . Essential hypertension 05/19/2008  . VENEREAL WART 04/04/2008  . CARCINOMA, SQUAMOUS CELL 04/04/2008  . GERD 04/04/2008  . Osteoarthritis of spine 04/04/2008  . Glucose intolerance (impaired glucose tolerance) 04/04/2008    Current Outpatient Prescriptions on File Prior to Visit  Medication Sig Dispense Refill  . Ascorbic Acid (VITAMIN C) 1000 MG tablet Take 1,000 mg by mouth daily.      . Calcium-Vitamin D (CALTRATE 600 PLUS-VIT D PO) Take 1 each by mouth daily.      . cyclobenzaprine (FLEXERIL) 10 MG tablet Take 1 tablet (10 mg total) by mouth 2 (two) times daily as needed. 60 tablet 1  . Multiple Vitamins-Minerals (MULTIVITAMIN,TX-MINERALS) tablet Take 1  tablet by mouth daily.      Marland Kitchen losartan (COZAAR) 50 MG tablet Take 1 tablet (50 mg total) by mouth daily. Must establish with NEW PCP for additional refills. (Patient not taking: Reported on 06/15/2015) 90 tablet 1   No current facility-administered medications on file prior to visit.    Past Medical History  Diagnosis Date  . VENEREAL WART   . CONTACT DERMATITIS&OTHER ECZEMA DUE UNSPEC CAUSE   . GLUCOSE INTOLERANCE, HX OF   . DEGENERATIVE JOINT DISEASE, CERVICAL SPINE   . HYPERTENSION   . DYSLIPIDEMIA   . GERD   . Tear of LCL (lateral collateral ligament) of knee 08/2014    R knee pain, dx on [place], s/p IAC injection    Past Surgical History  Procedure Laterality Date  . Cholecystectomy  1992  . Mohs surgery  2001  . Broken left foot  2006    Social History   Social History  . Marital Status: Married    Spouse Name: N/A  . Number of Children: N/A  . Years of Education: N/A   Social History Main Topics  . Smoking status: Never Smoker   . Smokeless tobacco: Never Used  . Alcohol Use: No  . Drug Use: No  . Sexual Activity: Not Currently   Other Topics Concern  .  Not on file   Social History Narrative   Married, lives with spouse. disabled due to neck pain    Family History  Problem Relation Age of Onset  . Alcohol abuse Other     parents  . Arthritis Other     grandparent  . Hypertension Other     parent, other relative  . Stroke Other     parent, other relative  . Colon cancer Father   . Colon cancer Brother   . Depression Mother   . Alcohol abuse Mother   . Alcohol abuse Sister   . Depression Sister   . Alcohol abuse Brother   . Depression Brother   . Heart attack Cousin   . Heart attack Mother 97    SMOKER    Review of Systems  Constitutional: Negative for fever.  Respiratory: Negative for cough, shortness of breath and wheezing.   Cardiovascular: Negative for chest pain, palpitations and leg swelling.  Gastrointestinal: Negative for abdominal  pain.       Gerd  Musculoskeletal: Positive for arthralgias.  Neurological: Negative for dizziness, light-headedness and headaches.       Objective:   Filed Vitals:   06/15/15 1116  BP: 152/100  Pulse: 75  Temp: 98.1 F (36.7 C)  Resp: 16   Filed Weights   06/15/15 1116  Weight: 169 lb (76.658 kg)   Body mass index is 31.95 kg/(m^2).   Physical Exam Constitutional: Appears well-developed and well-nourished. No distress.  Neck: Neck supple. No tracheal deviation present. No thyromegaly present.  No carotid bruit. No cervical adenopathy.   Cardiovascular: Normal rate, regular rhythm and normal heart sounds.   No murmur heard.  No edema Pulmonary/Chest: Effort normal and breath sounds normal. No respiratory distress. No wheezes.       Assessment & Plan:   See Problem List for Assessment and Plan of chronic medical problems.

## 2015-06-15 NOTE — Progress Notes (Signed)
Pre visit review using our clinic review tool, if applicable. No additional management support is needed unless otherwise documented below in the visit note. 

## 2015-06-15 NOTE — Assessment & Plan Note (Signed)
Does not tolerate most GERD meds Continue natural remedies  Continue weight loss efforts

## 2015-06-22 ENCOUNTER — Encounter: Payer: Self-pay | Admitting: Internal Medicine

## 2015-06-22 ENCOUNTER — Ambulatory Visit (INDEPENDENT_AMBULATORY_CARE_PROVIDER_SITE_OTHER): Payer: PPO | Admitting: Internal Medicine

## 2015-06-22 VITALS — BP 132/88 | HR 90 | Temp 98.6°F | Resp 16 | Wt 169.0 lb

## 2015-06-22 DIAGNOSIS — K222 Esophageal obstruction: Secondary | ICD-10-CM | POA: Diagnosis not present

## 2015-06-22 DIAGNOSIS — K219 Gastro-esophageal reflux disease without esophagitis: Secondary | ICD-10-CM

## 2015-06-22 DIAGNOSIS — L309 Dermatitis, unspecified: Secondary | ICD-10-CM | POA: Insufficient documentation

## 2015-06-22 DIAGNOSIS — I10 Essential (primary) hypertension: Secondary | ICD-10-CM | POA: Diagnosis not present

## 2015-06-22 DIAGNOSIS — C4492 Squamous cell carcinoma of skin, unspecified: Secondary | ICD-10-CM

## 2015-06-22 DIAGNOSIS — M5020 Other cervical disc displacement, unspecified cervical region: Secondary | ICD-10-CM | POA: Insufficient documentation

## 2015-06-22 MED ORDER — FAMOTIDINE 40 MG PO TABS: 40.0000 mg | ORAL_TABLET | Freq: Two times a day (BID) | ORAL | Status: DC

## 2015-06-22 MED ORDER — AMLODIPINE BESYLATE 5 MG PO TABS: 5.0000 mg | ORAL_TABLET | Freq: Every day | ORAL | Status: DC

## 2015-06-22 NOTE — Assessment & Plan Note (Signed)
Daily back pain Avoids certain activities Taking flexeril  Takes advil pm, excedrin, or goody powder as needed only

## 2015-06-22 NOTE — Assessment & Plan Note (Addendum)
Uncontrolled Has not tolerated several PPIs Has not tried pepcid so we will try that If she does not tolerate it we can try carafate Refer to GI given history of stricture Advised nsaids

## 2015-06-22 NOTE — Assessment & Plan Note (Signed)
BP well controlled Current regimen effective and well tolerated Continue current medications at current doses  

## 2015-06-22 NOTE — Progress Notes (Signed)
Pre visit review using our clinic review tool, if applicable. No additional management support is needed unless otherwise documented below in the visit note. 

## 2015-06-22 NOTE — Assessment & Plan Note (Signed)
S/p dilation in 2010 No dysphagia

## 2015-06-22 NOTE — Patient Instructions (Addendum)
   All other Health Maintenance issues reviewed.   All recommended immunizations and age-appropriate screenings are up-to-date or discussed.  No immunizations administered today.   Medications reviewed and updated.  Changes include starting pepcid for your stomach.  Call if this is not effective or you have side effects.   Your prescription(s) have been submitted to your pharmacy. Please take as directed and contact our office if you believe you are having problem(s) with the medication(s).  A referral for GI was ordered.   Please followup in one year
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
## 2015-06-24 ENCOUNTER — Encounter: Payer: Self-pay | Admitting: Internal Medicine

## 2015-06-25 MED ORDER — SUCRALFATE 1 GM/10ML PO SUSP: 1.0000 g | Freq: Three times a day (TID) | ORAL | Status: DC

## 2015-07-20 DIAGNOSIS — M1711 Unilateral primary osteoarthritis, right knee: Secondary | ICD-10-CM | POA: Diagnosis not present

## 2015-07-30 ENCOUNTER — Ambulatory Visit (INDEPENDENT_AMBULATORY_CARE_PROVIDER_SITE_OTHER): Payer: PPO | Admitting: Gastroenterology

## 2015-07-30 ENCOUNTER — Encounter: Payer: Self-pay | Admitting: Gastroenterology

## 2015-07-30 VITALS — BP 112/80 | HR 62 | Ht 61.0 in | Wt 169.0 lb

## 2015-07-30 DIAGNOSIS — K219 Gastro-esophageal reflux disease without esophagitis: Secondary | ICD-10-CM

## 2015-07-30 DIAGNOSIS — K222 Esophageal obstruction: Secondary | ICD-10-CM | POA: Diagnosis not present

## 2015-07-30 DIAGNOSIS — K5901 Slow transit constipation: Secondary | ICD-10-CM

## 2015-07-30 NOTE — Progress Notes (Signed)
Colleen Walsh 07/30/2015  Referring physician: Binnie Rail, MD  Reason for consult/chief complaint: No chief complaint on file. GERD  Subjective HPI:  Colleen Walsh sees me for the first time, having previously seen Dr. Deatra Ina. Upper endoscopy in 2010 is performed, and he dilated a distal stricture. She had a colonoscopy with no polyps in 2015 due to family history of colon cancer. She sees me today complaining of "worsening reflux". [name] is a rather difficult and tangential historian, and her symptoms are difficult to characterize. It seems that she has pyrosis and regurgitation, and is mostly bothered when it wakes her from sleep. She has tried multiple PPIs over the years with no improvement, and lately 40 mg a day of famotidine is not helping. Dr. Quay Burow prescribed her Carafate, but she could not afford it. She takes occasional NSAIDs due to knee pain. Colleen Walsh denies dysphagia, odynophagia, early satiety or weight loss. She has occasional nausea, and  toward constipation for many years -  diverticulosis was noted on her last colonoscopy.  ROS:  Review of Systems  Constitutional: Negative for appetite change and unexpected weight change.  HENT: Negative for mouth sores and voice change.   Eyes: Negative for pain and redness.  Respiratory: Negative for cough and shortness of breath.   Cardiovascular: Negative for chest pain and palpitations.  Genitourinary: Negative for dysuria and hematuria.  Musculoskeletal: Negative for myalgias and arthralgias.  Skin: Negative for pallor and rash.  Neurological: Negative for weakness and headaches.  Hematological: Negative for adenopathy.     Past Medical History: Past Medical History  Diagnosis Date  . VENEREAL WART   . CONTACT DERMATITIS&OTHER ECZEMA DUE UNSPEC CAUSE   . GLUCOSE INTOLERANCE, HX OF   . DEGENERATIVE JOINT DISEASE, CERVICAL SPINE   . HYPERTENSION   .  DYSLIPIDEMIA   . GERD   . Tear of LCL (lateral collateral ligament) of knee 08/2014    R knee pain, dx on [place], s/p IAC injection     Past Surgical History: Past Surgical History  Procedure Laterality Date  . Cholecystectomy  1992  . Mohs surgery  2001  . Broken left foot  2006     Family History: Family History  Problem Relation Age of Onset  . Alcohol abuse Other     parents  . Arthritis Other     grandparent  . Hypertension Other     parent, other relative  . Stroke Other     parent, other relative  . Colon cancer Father   . Diabetes Father   . Stroke Father   . Colon cancer Brother   . Depression Mother   . Alcohol abuse Mother   . Heart attack Mother 47    SMOKER  . Alcohol abuse Sister   . Depression Sister   . Alcohol abuse Brother   . Depression Brother   . Heart attack Cousin     Social History: Social History   Social History  . Marital Status: Married    Spouse Name: N/A  . Number of Children: N/A  . Years of Education: N/A   Social History Main Topics  . Smoking status: Never Smoker   . Smokeless tobacco: Never Used  . Alcohol Use: No  . Drug Use: No  . Sexual Activity: Not Currently   Other Topics Concern  . None   Social History Narrative   Married, lives with spouse. disabled due to neck pain ([place])  Walks for exercise    Allergies: Allergies  Allergen Reactions  . Dexlansoprazole     Raises BP  . Doxycycline     REACTION: rash on hand  . Ergostat [Ergotamine Tartrate]   . Erythromycin     Stomach irritation   . Lisinopril     Subtherapeutic  . Omeprazole     REACTION: nausea  . Pantoprazole Sodium     Subtherapeutic  . Phentermine     Raises BP  . Prilosec [Omeprazole Magnesium]   . Propranolol     Betachron  . Ranitidine     Subtherapeutic  . Statins Other (See Comments)  . Verapamil     REACTION: nausea    Outpatient Meds: Current Outpatient Prescriptions  Medication Sig Dispense Refill  . amLODipine  (NORVASC) 5 MG tablet Take 1 tablet (5 mg total) by mouth daily. 90 tablet 3  . Ascorbic Acid (VITAMIN C) 1000 MG tablet Take 1,000 mg by mouth daily.      . Calcium-Vitamin D (CALTRATE 600 PLUS-VIT D PO) Take 1 each by mouth daily.      . cyclobenzaprine (FLEXERIL) 10 MG tablet Take 1 tablet (10 mg total) by mouth 2 (two) times daily as needed. 60 tablet 1  . Multiple Vitamins-Minerals (MULTIVITAMIN,TX-MINERALS) tablet Take 1 tablet by mouth daily.      . sucralfate (CARAFATE) 1 GM/10ML suspension Take 10 mLs (1 g total) by mouth 4 (four) times daily -  with meals and at bedtime. 420 mL 0   No current facility-administered medications for this visit.      ___________________________________________________________________ Objective  Exam:  BP 112/80 mmHg  Pulse 62  Ht 5\' 1"  (1.549 m)  Wt 169 lb (76.658 kg)  BMI 31.95 kg/m2   General: this is a(n) Well-appearing woman with an unusual affect   Eyes: sclera anicteric, no redness  ENT: oral mucosa moist without lesions, no cervical or supraclavicular lymphadenopathy, good dentition  CV: RRR without murmur, S1/S2, no JVD, no peripheral edema  Resp: clear to auscultation bilaterally, normal RR and effort noted  GI: soft, no tenderness, with active bowel sounds. No guarding or palpable organomegaly noted.  Skin; warm and dry, no rash or jaundice noted  Neuro: awake, alert and oriented x 3. Normal gross motor function and fluent speech   Assessment: Encounter Diagnoses  Name Primary?  . Gastroesophageal reflux disease, esophagitis presence not specified Yes  . Stricture and stenosis of esophagus   . Slow transit constipation     Her symptoms are very difficult to characterize, she is having persistent pyrosis and regurgitation with nausea despite PPI therapy. It is no better on H2 blocker. We discussed nonacid reflux can be controlled with meds, thus the need for nonpharmacologic therapy. I suspect she may also have an  element of visceral hypersensitivity. She reports that the only area. That has lately been helpful is lemon juice and pulp mixed with water.  Plan:  Upper endoscopy to see if erosive or nonerosive reflux disease. It seems unlikely she will have recurrence of any significant stricture since she denies dysphagia.  Thank you for the courtesy of this consult.  Please call me with any questions or concerns.  [name]  CC: Binnie Rail, MD

## 2015-07-30 NOTE — Patient Instructions (Addendum)
You have been scheduled for an endoscopy. Please follow written instructions given to you at your visit today. If you use inhalers (even only as needed), please bring them with you on the day of your procedure. Your physician has requested that you go to www.startemmi.com and enter the access code given to you at your visit today. This web site gives a general overview about your procedure. However, you should still follow specific instructions given to you by our office regarding your preparation for the procedure.  If you are age 65 or older, your body mass index should be between 23-30. Your Body mass index is 31.95 kg/(m^2). If this is out of the aforementioned range listed, please consider follow up with your Primary Care Provider.  If you are age 20 or younger, your body mass index should be between 19-25. Your Body mass index is 31.95 kg/(m^2). If this is out of the aformentioned range listed, please consider follow up with your Primary Care Provider.   Thank you for choosing [facility]  Dr Wilfrid Lund III

## 2015-08-04 DIAGNOSIS — H01003 Unspecified blepharitis right eye, unspecified eyelid: Secondary | ICD-10-CM | POA: Diagnosis not present

## 2015-08-04 DIAGNOSIS — H04123 Dry eye syndrome of bilateral lacrimal glands: Secondary | ICD-10-CM | POA: Diagnosis not present

## 2015-08-13 ENCOUNTER — Encounter: Payer: Self-pay | Admitting: Gastroenterology

## 2015-08-13 ENCOUNTER — Ambulatory Visit (AMBULATORY_SURGERY_CENTER): Payer: PPO | Admitting: Gastroenterology

## 2015-08-13 VITALS — BP 114/75 | HR 73 | Temp 98.4°F | Resp 17 | Ht 61.0 in | Wt 169.0 lb

## 2015-08-13 DIAGNOSIS — K219 Gastro-esophageal reflux disease without esophagitis: Secondary | ICD-10-CM

## 2015-08-13 MED ORDER — SODIUM CHLORIDE 0.9 % IV SOLN: 500.0000 mL | INTRAVENOUS | Status: DC

## 2015-08-13 NOTE — Op Note (Signed)
[facility] Patient Name: Novice Villarroel Procedure Date: 08/13/2015 9:42 AM MRN: [id number] Endoscopist: Mallie Mussel L. Loletha Carrow , MD Age: 65 Referring MD:  Date of Birth: 1950-03-30 Gender: Female Account #: [id number] Procedure:                Upper GI endoscopy Indications:              Heartburn Medicines:                Monitored Anesthesia Care Procedure:                Pre-Anesthesia Assessment:                           - Prior to the procedure, a History and Physical                            was performed, and patient medications and                            allergies were reviewed. The patient's tolerance of                            previous anesthesia was also reviewed. The risks                            and benefits of the procedure and the sedation                            options and risks were discussed with the patient.                            All questions were answered, and informed consent                            was obtained. Prior Anticoagulants: The patient has                            taken no previous anticoagulant or antiplatelet                            agents. ASA Grade Assessment: II - A patient with                            mild systemic disease. After reviewing the risks                            and benefits, the patient was deemed in                            satisfactory condition to undergo the procedure.                           After obtaining informed consent, the endoscope was  passed under direct vision. Throughout the                            procedure, the patient's blood pressure, pulse, and                            oxygen saturations were monitored continuously. The                            Model GIF-[id number]) scope was introduced                            through the mouth, and advanced to the second part                            of duodenum. The upper GI endoscopy was                             accomplished without difficulty. The patient                            tolerated the procedure well. Scope In: Scope Out: Findings:                 A small hiatal hernia was present.                           A mild Schatzki ring (acquired) was found at the                            gastroesophageal junction.                           Normal mucosa was found in the entire esophagus.                           The stomach was normal.                           The cardia and gastric fundus were normal on                            retroflexion.                           The examined duodenum was normal. Complications:            No immediate complications. Estimated Blood Loss:     Estimated blood loss: none. Impression:               - Small hiatal hernia.                           - Mild Schatzki ring.                           - Normal mucosa was found in the entire esophagus.                           -  Normal stomach.                           - Normal examined duodenum.                           - No specimens collected. Recommendation:           - Patient has a contact number available for                            emergencies. The signs and symptoms of potential                            delayed complications were discussed with the                            patient. Return to normal activities tomorrow.                            Written discharge instructions were provided to the                            patient.                           - Resume previous diet.                           - Continue present medications.                           - The patient will consider esophageal pH and                            impedance testing, as she reports intolerance of,                            or lack of response to, multiple antisecretory meds. Sherlene Rickel L. Loletha Carrow, MD 08/13/2015 10:06:30 AM This report has been signed electronically.

## 2015-08-13 NOTE — Patient Instructions (Signed)
YOU HAD AN ENDOSCOPIC PROCEDURE TODAY AT [facility]:   Refer to the procedure report that was given to you for any specific questions about what was found during the examination.  If the procedure report does not answer your questions, please call your gastroenterologist to clarify.  If you requested that your care partner not be given the details of your procedure findings, then the procedure report has been included in a sealed envelope for you to review at your convenience later.  YOU SHOULD EXPECT: Some feelings of bloating in the abdomen. Passage of more gas than usual.  Walking can help get rid of the air that was put into your GI tract during the procedure and reduce the bloating. If you had a lower endoscopy (such as a colonoscopy or flexible sigmoidoscopy) you may notice spotting of blood in your stool or on the toilet paper. If you underwent a bowel prep for your procedure, you may not have a normal bowel movement for a few days.  Please Note:  You might notice some irritation and congestion in your nose or some drainage.  This is from the oxygen used during your procedure.  There is no need for concern and it should clear up in a day or so.  SYMPTOMS TO REPORT IMMEDIATELY:   Following upper endoscopy (EGD)  Vomiting of blood or coffee ground material  New chest pain or pain under the shoulder blades  Painful or persistently difficult swallowing  New shortness of breath  Fever of 100F or higher  Black, tarry-looking stools  For urgent or emergent issues, a gastroenterologist can be reached at any hour by calling [phone].   DIET: Your first meal following the procedure should be a small meal and then it is ok to progress to your normal diet. Heavy or fried foods are harder to digest and may make you feel nauseous or bloated.  Likewise, meals heavy in dairy and vegetables can increase bloating.  Drink plenty of fluids but you should avoid alcoholic beverages for  24 hours.  ACTIVITY:  You should plan to take it easy for the rest of today and you should NOT DRIVE or use heavy machinery until tomorrow (because of the sedation medicines used during the test).    FOLLOW UP: Our staff will call the number listed on your records the next business day following your procedure to check on you and address any questions or concerns that you may have regarding the information given to you following your procedure. If we do not reach you, we will leave a message.  However, if you are feeling well and you are not experiencing any problems, there is no need to return our call.  We will assume that you have returned to your regular daily activities without incident.  If any biopsies were taken you will be contacted by phone or by letter within the next 1-3 weeks.  Please call us at [phone] if you have not heard about the biopsies in 3 weeks.    SIGNATURES/CONFIDENTIALITY: You and/or your care partner have signed paperwork which will be entered into your electronic medical record.  These signatures attest to the fact that that the information above on your After Visit Summary has been reviewed and is understood.  Full responsibility of the confidentiality of this discharge information lies with you and/or your care-partner.  Please read GERD packet provided.

## 2015-08-13 NOTE — Progress Notes (Signed)
Report to PACU, RN, vss, BBS= Clear.  

## 2015-08-14 ENCOUNTER — Telehealth: Payer: Self-pay | Admitting: *Deleted

## 2015-08-14 NOTE — Telephone Encounter (Signed)
Left message on f/u call 

## 2015-08-18 ENCOUNTER — Encounter: Payer: Self-pay | Admitting: Internal Medicine

## 2015-08-18 DIAGNOSIS — K222 Esophageal obstruction: Secondary | ICD-10-CM | POA: Insufficient documentation

## 2015-08-18 DIAGNOSIS — K449 Diaphragmatic hernia without obstruction or gangrene: Secondary | ICD-10-CM | POA: Insufficient documentation

## 2015-08-31 ENCOUNTER — Other Ambulatory Visit: Payer: Self-pay | Admitting: Obstetrics & Gynecology

## 2015-08-31 DIAGNOSIS — M899 Disorder of bone, unspecified: Secondary | ICD-10-CM | POA: Diagnosis not present

## 2015-08-31 DIAGNOSIS — Z8739 Personal history of other diseases of the musculoskeletal system and connective tissue: Secondary | ICD-10-CM

## 2015-08-31 DIAGNOSIS — Z1151 Encounter for screening for human papillomavirus (HPV): Secondary | ICD-10-CM | POA: Diagnosis not present

## 2015-08-31 DIAGNOSIS — R29890 Loss of height: Secondary | ICD-10-CM

## 2015-08-31 DIAGNOSIS — Z01419 Encounter for gynecological examination (general) (routine) without abnormal findings: Secondary | ICD-10-CM | POA: Diagnosis not present

## 2015-09-03 ENCOUNTER — Ambulatory Visit
Admission: RE | Admit: 2015-09-03 | Discharge: 2015-09-03 | Disposition: A | Discharge status: Home / Self Care | Payer: PPO | Source: Ambulatory Visit | Attending: Obstetrics & Gynecology | Admitting: Obstetrics & Gynecology

## 2015-09-03 DIAGNOSIS — R29890 Loss of height: Secondary | ICD-10-CM

## 2015-09-03 DIAGNOSIS — Z780 Asymptomatic menopausal state: Secondary | ICD-10-CM | POA: Diagnosis not present

## 2015-09-03 DIAGNOSIS — M8589 Other specified disorders of bone density and structure, multiple sites: Secondary | ICD-10-CM | POA: Diagnosis not present

## 2015-09-03 DIAGNOSIS — Z8739 Personal history of other diseases of the musculoskeletal system and connective tissue: Secondary | ICD-10-CM

## 2015-09-04 ENCOUNTER — Telehealth: Payer: Self-pay | Admitting: Gastroenterology

## 2015-09-04 ENCOUNTER — Ambulatory Visit (INDEPENDENT_AMBULATORY_CARE_PROVIDER_SITE_OTHER): Payer: PPO | Admitting: Nurse Practitioner

## 2015-09-04 ENCOUNTER — Encounter: Payer: Self-pay | Admitting: Nurse Practitioner

## 2015-09-04 VITALS — BP 128/82 | HR 83 | Temp 98.4°F | Ht 61.0 in | Wt 169.5 lb

## 2015-09-04 DIAGNOSIS — I10 Essential (primary) hypertension: Secondary | ICD-10-CM | POA: Diagnosis not present

## 2015-09-04 DIAGNOSIS — K219 Gastro-esophageal reflux disease without esophagitis: Secondary | ICD-10-CM | POA: Diagnosis not present

## 2015-09-04 MED ORDER — SUCRALFATE 1 GM/10ML PO SUSP: 1.0000 g | Freq: Three times a day (TID) | ORAL | 1 refills | Status: DC

## 2015-09-04 NOTE — Progress Notes (Signed)
Subjective:  Patient ID: Colleen Walsh, female    DOB: 12/29/1950  Age: 65 y.o. MRN: [id number]  CC: GI Problem (GERD: Pt had a recent EDG that showed stenosis. ) and Hypertension (Pt states that her BP has been elevated and she states that it is due to the sx of GERD that she has been experiencing. )   HPI Lallie A Bearman presents for HTN and GERD f/up  HTN:  BP Readings from Last 3 Encounters:  09/04/15 128/82  08/13/15 114/75  07/30/15 112/80  reports Elevated BP reading at home when has GERD symptoms (heartburn and epigastric pain) Last night BP was 186/80, then 2hrs later it was 128/76. Denies any Cp, SOB, edema, palpitation, or headache. Taking medication as prescribed.  GERD:  Reports worseing GERD symptoms in last 3days, normally uses home remedy of lemon juice, but it has not helped completely. States GI doctor talked about surgical procedure, but not able to remember which procedure was. He has not contacted GI office to discuss further GERD management. has not been able to tolerate PPIs or H2 blocker at this point.gets some relief from TUMs or Alka-Seltzer. Does not always avoid trigger foods. (ate some 3days ago) She is willing to try another medication while waiting to discuss procedure with GI.  Outpatient Medications Prior to Visit  Medication Sig Dispense Refill  . amLODipine (NORVASC) 5 MG tablet Take 1 tablet (5 mg total) by mouth daily. 90 tablet 3  . Ascorbic Acid (VITAMIN C) 1000 MG tablet Take 1,000 mg by mouth daily.      . Calcium-Vitamin D (CALTRATE 600 PLUS-VIT D PO) Take 1 each by mouth daily.      . cyclobenzaprine (FLEXERIL) 10 MG tablet Take 1 tablet (10 mg total) by mouth 2 (two) times daily as needed. 60 tablet 1  . Multiple Vitamins-Minerals (MULTIVITAMIN,TX-MINERALS) tablet Take 1 tablet by mouth daily.      . sucralfate (CARAFATE) 1 GM/10ML suspension Take 10 mLs (1 g total) by mouth 4 (four) times daily -  with meals and at bedtime.  420 mL 0   Facility-Administered Medications Prior to Visit  Medication Dose Route Frequency Provider Last Rate Last Dose  . 0.9 %  sodium chloride infusion  500 mL Intravenous Continuous [name], MD        ROS Review of Systems  Respiratory: Negative for cough, chest tightness, shortness of breath and stridor.   Cardiovascular: Negative for chest pain, palpitations and leg swelling.  Gastrointestinal: Positive for abdominal pain. Negative for abdominal distention, constipation, nausea and vomiting.  Neurological: Negative for headaches.    Objective:  BP 128/82 (BP Location: Left Arm, Patient Position: Sitting, Cuff Size: Normal)   Pulse 83   Temp 98.4 F (36.9 C) (Oral)   Ht 5\' 1"  (1.549 m)   Wt 169 lb 8 oz (76.9 kg)   SpO2 98%   BMI 32.03 kg/m   BP Readings from Last 3 Encounters:  09/04/15 128/82  08/13/15 114/75  07/30/15 112/80    Wt Readings from Last 3 Encounters:  09/04/15 169 lb 8 oz (76.9 kg)  08/13/15 169 lb (76.7 kg)  07/30/15 169 lb (76.7 kg)    Physical Exam  Constitutional: She is oriented to person, place, and time. She appears well-developed. No distress.  Cardiovascular: Normal rate and normal heart sounds.   Pulmonary/Chest: Effort normal and breath sounds normal.  Abdominal: Soft. Bowel sounds are normal. She exhibits no distension. There is no tenderness.  Musculoskeletal: She exhibits no edema.  Neurological: She is alert and oriented to person, place, and time.  Skin: Skin is warm and dry.  Vitals reviewed.   Lab Results  Component Value Date   WBC 6.0 12/10/2013   HGB 13.8 12/10/2013   HCT 41.3 12/10/2013   PLT 373.0 12/10/2013   GLUCOSE 92 12/15/2014   CHOL 210 (H) 12/15/2014   TRIG 142.0 12/15/2014   HDL 43.20 12/15/2014   LDLDIRECT 189.8 09/19/2012   LDLCALC 139 (H) 12/15/2014   ALT 11 12/10/2013   AST 14 12/10/2013   NA 135 12/15/2014   K 4.1 12/15/2014   CL 101 12/15/2014   CREATININE 0.67 12/15/2014   BUN 8  12/15/2014   CO2 27 12/15/2014   TSH 1.36 12/10/2013   HGBA1C 5.2 12/15/2014    Assessment & Plan:   Byron was seen today for gi problem and hypertension.  Diagnoses and all orders for this visit:  Gastroesophageal reflux disease, esophagitis presence not specified -     sucralfate (CARAFATE) 1 GM/10ML suspension; Take 10 mLs (1 g total) by mouth 4 (four) times daily -  with meals and at bedtime.  Essential hypertension   I have discontinued Ms. Norsworthy's sucralfate. I am also having her start on sucralfate. Additionally, I am having her maintain her vitamin C, (multivitamin,tx-minerals), Calcium-Vitamin D (CALTRATE 600 PLUS-VIT D PO), cyclobenzaprine, amLODipine, and fluorometholone. We will continue to administer sodium chloride.  Meds ordered this encounter  Medications  . fluorometholone (FML) 0.1 % ophthalmic suspension  . sucralfate (CARAFATE) 1 GM/10ML suspension    Sig: Take 10 mLs (1 g total) by mouth 4 (four) times daily -  with meals and at bedtime.    Dispense:  420 mL    Refill:  1    Order Specific Question:   Supervising Provider    Answer:   Cassandria Anger [[id number]]   Follow-up: Return if symptoms worsen or fail to improve with GI.  Wilfred Lacy, NP

## 2015-09-04 NOTE — Telephone Encounter (Signed)
Dr Loletha Carrow the pt wants to schedule the test you mentioned on your procedure note. Is this ok to set up?   Please advise.    "The patient will consider esophageal pH and impedance testing, as she reports intolerance of, or lack of response to, multiple antisecretory meds."

## 2015-09-04 NOTE — Progress Notes (Signed)
Pre visit review using our clinic review tool, if applicable. No additional management support is needed unless otherwise documented below in the visit note. 

## 2015-09-04 NOTE — Telephone Encounter (Signed)
Yes, that was my recommendation.  Esophageal pH and impedance testing.  Dx: GERD

## 2015-09-04 NOTE — Patient Instructions (Addendum)
Contact GI about recommended procedure Start Sucralfate today. Avoid any NSAIDs  Continue BP check at home (morning is preferred). Maintain current BP medication  Food Choices for Gastroesophageal Reflux Disease, Adult When you have gastroesophageal reflux disease (GERD), the foods you eat and your eating habits are very important. Choosing the right foods can help ease the discomfort of GERD. WHAT GENERAL GUIDELINES DO I NEED TO FOLLOW?  Choose fruits, vegetables, whole grains, low-fat dairy products, and low-fat meat, fish, and poultry.  Limit fats such as oils, salad dressings, butter, nuts, and avocado.  Keep a food diary to identify foods that cause symptoms.  Avoid foods that cause reflux. These may be different for different people.  Eat frequent small meals instead of three large meals each day.  Eat your meals slowly, in a relaxed setting.  Limit fried foods.  Cook foods using methods other than frying.  Avoid drinking alcohol.  Avoid drinking large amounts of liquids with your meals.  Avoid bending over or lying down until 2-3 hours after eating. WHAT FOODS ARE NOT RECOMMENDED? The following are some foods and drinks that may worsen your symptoms: Vegetables Tomatoes. Tomato juice. Tomato and spaghetti sauce. Chili peppers. Onion and garlic. Horseradish. Fruits Oranges, grapefruit, and lemon (fruit and juice). Meats High-fat meats, fish, and poultry. This includes hot dogs, ribs, ham, sausage, salami, and bacon. Dairy Whole milk and chocolate milk. Sour cream. Cream. Butter. Ice cream. Cream cheese.  Beverages Coffee and tea, with or without caffeine. Carbonated beverages or energy drinks. Condiments Hot sauce. Barbecue sauce.  Sweets/Desserts Chocolate and cocoa. Donuts. Peppermint and spearmint. Fats and Oils High-fat foods, including [place] fries and potato chips. Other Vinegar. Strong spices, such as black pepper, white pepper, red pepper, cayenne,  curry powder, cloves, ginger, and chili powder. The items listed above may not be a complete list of foods and beverages to avoid. Contact your dietitian for more information.   This information is not intended to replace advice given to you by your health care provider. Make sure you discuss any questions you have with your health care provider.   Document Released: 12/27/2004 Document Revised: 01/17/2014 Document Reviewed: 10/31/2012 Elsevier Interactive Patient Education [employer].

## 2015-09-04 NOTE — Telephone Encounter (Signed)
I spoke with the pt and advised her I would get the appt set up and call her on Monday with the date, time and instructions.

## 2015-09-04 NOTE — Telephone Encounter (Signed)
Left message on machine to call back  

## 2015-09-09 NOTE — Telephone Encounter (Signed)
You have been scheduled for an esophageal manometry at [facility] on 09/28/15 at 1030 am. Please arrive 30 minutes prior to your procedure for registration. You will need to go to outpatient registration (1st floor of the hospital) first. Make certain to bring your insurance cards as well as a complete list of medications.  Please remember the following:  1) Nothing to eat or drink after 12:00 midnight on the night before your test.  2) Hold all diabetic medications/insulin the morning of the test. You may eat and take your medications after the test.  3) For 3 days prior to your test do not take: Dexilant, Prevacid, Nexium, Protonix, Aciphex, Zegerid, Pantoprazole, Prilosec or omeprazole.  4) For 2 days prior to your test, do not take: Reglan, Tagamet, Zantac, Axid or Pepcid.  5) You MAY use an antacid such as Rolaids or Tums up to 12 hours prior to your test.  It will take at least 2 weeks to receive the results of this test from your physician. ------------------------------------------ ABOUT ESOPHAGEAL MANOMETRY Esophageal manometry (muh-NOM-uh-tree) is a test that gauges how well your esophagus works. Your esophagus is the long, muscular tube that connects your throat to your stomach. Esophageal manometry measures the rhythmic muscle contractions (peristalsis) that occur in your esophagus when you swallow. Esophageal manometry also measures the coordination and force exerted by the muscles of your esophagus.  During esophageal manometry, a thin, flexible tube (catheter) that contains sensors is passed through your nose, down your esophagus and into your stomach. Esophageal manometry can be helpful in diagnosing some mostly uncommon disorders that affect your esophagus.  Why it's done Esophageal manometry is used to evaluate the movement (motility) of food through the esophagus and into the stomach. The test measures how well the circular bands of muscle (sphincters) at the top and  bottom of your esophagus open and close, as well as the pressure, strength and pattern of the wave of esophageal muscle contractions that moves food along.  What you can expect Esophageal manometry is an outpatient procedure done without sedation. Most people tolerate it well. You may be asked to change into a hospital gown before the test starts.  During esophageal manometry  While you are sitting up, a member of your health care team sprays your throat with a numbing medication or puts numbing gel in your nose or both.  A catheter is guided through your nose into your esophagus. The catheter may be sheathed in a water-filled sleeve. It doesn't interfere with your breathing. However, your eyes may water, and you may gag. You may have a slight nosebleed from irritation.  After the catheter is in place, you may be asked to lie on your back on an exam table, or you may be asked to remain seated.  You then swallow small sips of water. As you do, a computer connected to the catheter records the pressure, strength and pattern of your esophageal muscle contractions.  During the test, you'll be asked to breathe slowly and smoothly, remain as still as possible, and swallow only when you're asked to do so.  A member of your health care team may move the catheter down into your stomach while the catheter continues its measurements.  The catheter then is slowly withdrawn. The test usually lasts 20 to 30 minutes.  After esophageal manometry  When your esophageal manometry is complete, you may return to your normal activities  This test typically takes 30-45 minutes to complete. ____________________________________________  Left message on machine to call back  

## 2015-09-10 NOTE — Telephone Encounter (Signed)
The patient has been notified of this information and all questions answered.  Pt verbalized understanding of the procedures and she is aware that the instructions are available on My Chart

## 2015-09-18 DIAGNOSIS — Z1231 Encounter for screening mammogram for malignant neoplasm of breast: Secondary | ICD-10-CM | POA: Diagnosis not present

## 2015-09-28 ENCOUNTER — Encounter (HOSPITAL_COMMUNITY)
Admission: RE | Disposition: A | Discharge status: Home / Self Care | Payer: Self-pay | Source: Ambulatory Visit | Attending: Gastroenterology

## 2015-09-28 ENCOUNTER — Ambulatory Visit (HOSPITAL_COMMUNITY)
Admission: RE | Admit: 2015-09-28 | Discharge: 2015-09-28 | Disposition: A | Discharge status: Home / Self Care | Payer: PPO | Source: Ambulatory Visit | Attending: Gastroenterology | Admitting: Gastroenterology

## 2015-09-28 DIAGNOSIS — K449 Diaphragmatic hernia without obstruction or gangrene: Secondary | ICD-10-CM | POA: Diagnosis not present

## 2015-09-28 DIAGNOSIS — K219 Gastro-esophageal reflux disease without esophagitis: Secondary | ICD-10-CM | POA: Diagnosis not present

## 2015-09-28 HISTORY — PX: 24 HOUR PH STUDY: SHX5419

## 2015-09-28 HISTORY — PX: ESOPHAGEAL MANOMETRY: SHX5429

## 2015-09-28 SURGERY — MANOMETRY, ESOPHAGUS

## 2015-09-28 MED ORDER — LIDOCAINE VISCOUS 2 % MT SOLN
OROMUCOSAL | Status: AC
  Filled 2015-09-28: qty 15

## 2015-09-28 SURGICAL SUPPLY — 2 items
FACESHIELD LNG OPTICON STERILE (SAFETY) IMPLANT
GLOVE BIO SURGEON STRL SZ8 (GLOVE) ×6 IMPLANT
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
## 2015-09-29 ENCOUNTER — Encounter (HOSPITAL_COMMUNITY): Payer: Self-pay | Admitting: Gastroenterology

## 2015-10-06 NOTE — Op Note (Signed)
Esophageal Manometry  Procedure  After confirmation of potential allergies, a topical analgesic was used to numb the nares followed by trans-nasal insertion of a High Resolution Manometry Catheter. Pressure bands of both UES and LES were observed on the color contour. Patient instructed to take deep breath to verify placement of catheter; diaphragmatic pinch noted on inspiration. Patient was assisted to supine position and catheter was stabilized. Patient encouraged to relax while acclimating to catheter for approximately 5 minutes. A 30 second baseline pressure was obtained to identify the UES and LES followed by a series of wet swallows using 5 ccs room temperature water to assess esophageal motility. At the conclusion of the procedure; the catheter was removed.   Indications  GERD   Interpretation / Findings  Slightly elevated resting EG junction pressure with complete relaxation after deglutition IRP 3.7 mmHg (Normal Integrated relaxation pressure <65mmHg) Esophageal contractions were 100% (10/10 swallows) peristaltic with normal distal latency and distal contractile integral Manometric evidence of hiatal hernia Normal basal with slightly elevated residual upper esophageal pressures Complete bolus clearance of 10/10 swallows  Impression Normal relaxation of the EG junction No significant esophageal peristaltic abnormality was detected on this study based on [place] classification  Damaris Hippo, MD [facility]

## 2015-10-06 NOTE — Op Note (Signed)
24 Hour pH Impedance Procedure Description A multichannel 24 hour pH Impedance Catheter was inserted trans nasal. The distal esophageal pH sensor was placed 5 cm above the manometrically located proximal border of lower esophageal sphincter. Prior to insertion of the pH impedance catheter, the sensors were calibrated in buffer solutions with distinct pH chosen by the manufacturer of the equipment.  Probe was stabilized by taping the nares.  Detailed instructions were given to patient regarding documentation of meals, symptoms, and of supine positioning at home; patient verbalized understanding.  A printed pH diary was given to patient.  Patient was instructed to return to hospital in 24 hours with pH diary, to have probe removed.    Interpretation / Findings 1. Increased esophageal acid exposure with % time pH<4 of 9.2%; DeMeester score 39.6 (Normal <14.72) off PPI 2. Esophageal acid exposure is abnormal in both upright and supine position 3. No symptom correlation with reflux events based on SAP (Symptom association probability) 4. The total number of reflux events were slightly elevated and were predominantly acid reflux  Impression Evidence of significant gastro esophageal acid reflux  Damaris Hippo, MD [facility]

## 2015-10-08 ENCOUNTER — Telehealth: Payer: Self-pay | Admitting: Gastroenterology

## 2015-10-08 NOTE — Telephone Encounter (Signed)
Pt aware. Pt scheduled to see Dr. Loletha Carrow 11/26/15@11 :30am. Pt aware of appt.

## 2015-10-08 NOTE — Telephone Encounter (Signed)
Gregary Signs,    Please call with results.  Her esophageal motility study is normal.  I believe she was already called about the GERD evaluated on the pH study.  I would like to see he in the office next available to discuss where we are and discuss treatment options.  Thanks - HD

## 2015-10-16 ENCOUNTER — Ambulatory Visit: Payer: Self-pay | Admitting: Internal Medicine

## 2015-10-19 ENCOUNTER — Encounter: Payer: Self-pay | Admitting: Gastroenterology

## 2015-10-21 DIAGNOSIS — M1711 Unilateral primary osteoarthritis, right knee: Secondary | ICD-10-CM | POA: Diagnosis not present

## 2015-11-11 DIAGNOSIS — M1711 Unilateral primary osteoarthritis, right knee: Secondary | ICD-10-CM | POA: Diagnosis not present

## 2015-11-18 DIAGNOSIS — M1711 Unilateral primary osteoarthritis, right knee: Secondary | ICD-10-CM | POA: Diagnosis not present

## 2015-11-25 DIAGNOSIS — M1711 Unilateral primary osteoarthritis, right knee: Secondary | ICD-10-CM | POA: Diagnosis not present

## 2015-11-26 ENCOUNTER — Ambulatory Visit: Payer: Self-pay | Admitting: Gastroenterology

## 2015-11-30 DIAGNOSIS — M3501 Sicca syndrome with keratoconjunctivitis: Secondary | ICD-10-CM | POA: Diagnosis not present

## 2015-11-30 DIAGNOSIS — H01003 Unspecified blepharitis right eye, unspecified eyelid: Secondary | ICD-10-CM | POA: Diagnosis not present

## 2015-11-30 DIAGNOSIS — H04123 Dry eye syndrome of bilateral lacrimal glands: Secondary | ICD-10-CM | POA: Diagnosis not present

## 2015-12-11 ENCOUNTER — Other Ambulatory Visit: Payer: Self-pay | Admitting: Internal Medicine

## 2016-01-06 DIAGNOSIS — M1711 Unilateral primary osteoarthritis, right knee: Secondary | ICD-10-CM | POA: Diagnosis not present

## 2016-03-15 DIAGNOSIS — M5010 Cervical disc disorder with radiculopathy, unspecified cervical region: Secondary | ICD-10-CM | POA: Diagnosis not present

## 2016-03-15 DIAGNOSIS — M5384 Other specified dorsopathies, thoracic region: Secondary | ICD-10-CM | POA: Diagnosis not present

## 2016-03-15 DIAGNOSIS — M9901 Segmental and somatic dysfunction of cervical region: Secondary | ICD-10-CM | POA: Diagnosis not present

## 2016-03-15 DIAGNOSIS — M9902 Segmental and somatic dysfunction of thoracic region: Secondary | ICD-10-CM | POA: Diagnosis not present

## 2016-03-17 DIAGNOSIS — M9902 Segmental and somatic dysfunction of thoracic region: Secondary | ICD-10-CM | POA: Diagnosis not present

## 2016-03-17 DIAGNOSIS — M5384 Other specified dorsopathies, thoracic region: Secondary | ICD-10-CM | POA: Diagnosis not present

## 2016-03-17 DIAGNOSIS — M5010 Cervical disc disorder with radiculopathy, unspecified cervical region: Secondary | ICD-10-CM | POA: Diagnosis not present

## 2016-03-17 DIAGNOSIS — M9901 Segmental and somatic dysfunction of cervical region: Secondary | ICD-10-CM | POA: Diagnosis not present

## 2016-03-22 DIAGNOSIS — M5384 Other specified dorsopathies, thoracic region: Secondary | ICD-10-CM | POA: Diagnosis not present

## 2016-03-22 DIAGNOSIS — M9901 Segmental and somatic dysfunction of cervical region: Secondary | ICD-10-CM | POA: Diagnosis not present

## 2016-03-22 DIAGNOSIS — M5010 Cervical disc disorder with radiculopathy, unspecified cervical region: Secondary | ICD-10-CM | POA: Diagnosis not present

## 2016-03-22 DIAGNOSIS — M9902 Segmental and somatic dysfunction of thoracic region: Secondary | ICD-10-CM | POA: Diagnosis not present

## 2016-03-24 DIAGNOSIS — M9901 Segmental and somatic dysfunction of cervical region: Secondary | ICD-10-CM | POA: Diagnosis not present

## 2016-03-24 DIAGNOSIS — M5010 Cervical disc disorder with radiculopathy, unspecified cervical region: Secondary | ICD-10-CM | POA: Diagnosis not present

## 2016-03-24 DIAGNOSIS — M9902 Segmental and somatic dysfunction of thoracic region: Secondary | ICD-10-CM | POA: Diagnosis not present

## 2016-03-24 DIAGNOSIS — M5384 Other specified dorsopathies, thoracic region: Secondary | ICD-10-CM | POA: Diagnosis not present

## 2016-03-29 DIAGNOSIS — M5384 Other specified dorsopathies, thoracic region: Secondary | ICD-10-CM | POA: Diagnosis not present

## 2016-03-29 DIAGNOSIS — M9902 Segmental and somatic dysfunction of thoracic region: Secondary | ICD-10-CM | POA: Diagnosis not present

## 2016-03-29 DIAGNOSIS — M9901 Segmental and somatic dysfunction of cervical region: Secondary | ICD-10-CM | POA: Diagnosis not present

## 2016-03-29 DIAGNOSIS — M5010 Cervical disc disorder with radiculopathy, unspecified cervical region: Secondary | ICD-10-CM | POA: Diagnosis not present

## 2016-03-31 DIAGNOSIS — M5010 Cervical disc disorder with radiculopathy, unspecified cervical region: Secondary | ICD-10-CM | POA: Diagnosis not present

## 2016-03-31 DIAGNOSIS — M9902 Segmental and somatic dysfunction of thoracic region: Secondary | ICD-10-CM | POA: Diagnosis not present

## 2016-03-31 DIAGNOSIS — M5384 Other specified dorsopathies, thoracic region: Secondary | ICD-10-CM | POA: Diagnosis not present

## 2016-03-31 DIAGNOSIS — M9901 Segmental and somatic dysfunction of cervical region: Secondary | ICD-10-CM | POA: Diagnosis not present

## 2016-04-01 DIAGNOSIS — M5010 Cervical disc disorder with radiculopathy, unspecified cervical region: Secondary | ICD-10-CM | POA: Diagnosis not present

## 2016-04-01 DIAGNOSIS — M9902 Segmental and somatic dysfunction of thoracic region: Secondary | ICD-10-CM | POA: Diagnosis not present

## 2016-04-01 DIAGNOSIS — M9901 Segmental and somatic dysfunction of cervical region: Secondary | ICD-10-CM | POA: Diagnosis not present

## 2016-04-01 DIAGNOSIS — M5384 Other specified dorsopathies, thoracic region: Secondary | ICD-10-CM | POA: Diagnosis not present

## 2016-04-04 DIAGNOSIS — M5010 Cervical disc disorder with radiculopathy, unspecified cervical region: Secondary | ICD-10-CM | POA: Diagnosis not present

## 2016-04-04 DIAGNOSIS — M9902 Segmental and somatic dysfunction of thoracic region: Secondary | ICD-10-CM | POA: Diagnosis not present

## 2016-04-04 DIAGNOSIS — M5384 Other specified dorsopathies, thoracic region: Secondary | ICD-10-CM | POA: Diagnosis not present

## 2016-04-04 DIAGNOSIS — M9901 Segmental and somatic dysfunction of cervical region: Secondary | ICD-10-CM | POA: Diagnosis not present

## 2016-04-05 DIAGNOSIS — M9902 Segmental and somatic dysfunction of thoracic region: Secondary | ICD-10-CM | POA: Diagnosis not present

## 2016-04-05 DIAGNOSIS — M5010 Cervical disc disorder with radiculopathy, unspecified cervical region: Secondary | ICD-10-CM | POA: Diagnosis not present

## 2016-04-05 DIAGNOSIS — M9901 Segmental and somatic dysfunction of cervical region: Secondary | ICD-10-CM | POA: Diagnosis not present

## 2016-04-05 DIAGNOSIS — M5384 Other specified dorsopathies, thoracic region: Secondary | ICD-10-CM | POA: Diagnosis not present

## 2016-04-07 DIAGNOSIS — M5010 Cervical disc disorder with radiculopathy, unspecified cervical region: Secondary | ICD-10-CM | POA: Diagnosis not present

## 2016-04-07 DIAGNOSIS — M5384 Other specified dorsopathies, thoracic region: Secondary | ICD-10-CM | POA: Diagnosis not present

## 2016-04-07 DIAGNOSIS — M9901 Segmental and somatic dysfunction of cervical region: Secondary | ICD-10-CM | POA: Diagnosis not present

## 2016-04-07 DIAGNOSIS — M9902 Segmental and somatic dysfunction of thoracic region: Secondary | ICD-10-CM | POA: Diagnosis not present

## 2016-04-11 DIAGNOSIS — M9901 Segmental and somatic dysfunction of cervical region: Secondary | ICD-10-CM | POA: Diagnosis not present

## 2016-04-11 DIAGNOSIS — M5384 Other specified dorsopathies, thoracic region: Secondary | ICD-10-CM | POA: Diagnosis not present

## 2016-04-11 DIAGNOSIS — M9902 Segmental and somatic dysfunction of thoracic region: Secondary | ICD-10-CM | POA: Diagnosis not present

## 2016-04-11 DIAGNOSIS — M5010 Cervical disc disorder with radiculopathy, unspecified cervical region: Secondary | ICD-10-CM | POA: Diagnosis not present

## 2016-04-13 DIAGNOSIS — M5010 Cervical disc disorder with radiculopathy, unspecified cervical region: Secondary | ICD-10-CM | POA: Diagnosis not present

## 2016-04-13 DIAGNOSIS — M9902 Segmental and somatic dysfunction of thoracic region: Secondary | ICD-10-CM | POA: Diagnosis not present

## 2016-04-13 DIAGNOSIS — M5384 Other specified dorsopathies, thoracic region: Secondary | ICD-10-CM | POA: Diagnosis not present

## 2016-04-13 DIAGNOSIS — M9901 Segmental and somatic dysfunction of cervical region: Secondary | ICD-10-CM | POA: Diagnosis not present

## 2016-04-18 DIAGNOSIS — M9902 Segmental and somatic dysfunction of thoracic region: Secondary | ICD-10-CM | POA: Diagnosis not present

## 2016-04-18 DIAGNOSIS — M5010 Cervical disc disorder with radiculopathy, unspecified cervical region: Secondary | ICD-10-CM | POA: Diagnosis not present

## 2016-04-18 DIAGNOSIS — M9901 Segmental and somatic dysfunction of cervical region: Secondary | ICD-10-CM | POA: Diagnosis not present

## 2016-04-18 DIAGNOSIS — M5384 Other specified dorsopathies, thoracic region: Secondary | ICD-10-CM | POA: Diagnosis not present

## 2016-04-20 DIAGNOSIS — M5384 Other specified dorsopathies, thoracic region: Secondary | ICD-10-CM | POA: Diagnosis not present

## 2016-04-20 DIAGNOSIS — M5010 Cervical disc disorder with radiculopathy, unspecified cervical region: Secondary | ICD-10-CM | POA: Diagnosis not present

## 2016-04-20 DIAGNOSIS — M9901 Segmental and somatic dysfunction of cervical region: Secondary | ICD-10-CM | POA: Diagnosis not present

## 2016-04-20 DIAGNOSIS — M9902 Segmental and somatic dysfunction of thoracic region: Secondary | ICD-10-CM | POA: Diagnosis not present

## 2016-04-21 DIAGNOSIS — H01003 Unspecified blepharitis right eye, unspecified eyelid: Secondary | ICD-10-CM | POA: Diagnosis not present

## 2016-04-21 DIAGNOSIS — H35363 Drusen (degenerative) of macula, bilateral: Secondary | ICD-10-CM | POA: Diagnosis not present

## 2016-04-21 DIAGNOSIS — H2513 Age-related nuclear cataract, bilateral: Secondary | ICD-10-CM | POA: Diagnosis not present

## 2016-04-21 DIAGNOSIS — H25013 Cortical age-related cataract, bilateral: Secondary | ICD-10-CM | POA: Diagnosis not present

## 2016-04-21 DIAGNOSIS — H04123 Dry eye syndrome of bilateral lacrimal glands: Secondary | ICD-10-CM | POA: Diagnosis not present

## 2016-04-21 DIAGNOSIS — H35033 Hypertensive retinopathy, bilateral: Secondary | ICD-10-CM | POA: Diagnosis not present

## 2016-04-21 LAB — HM DIABETES EYE EXAM

## 2016-04-25 DIAGNOSIS — M5384 Other specified dorsopathies, thoracic region: Secondary | ICD-10-CM | POA: Diagnosis not present

## 2016-04-25 DIAGNOSIS — M5010 Cervical disc disorder with radiculopathy, unspecified cervical region: Secondary | ICD-10-CM | POA: Diagnosis not present

## 2016-04-25 DIAGNOSIS — M9902 Segmental and somatic dysfunction of thoracic region: Secondary | ICD-10-CM | POA: Diagnosis not present

## 2016-04-25 DIAGNOSIS — M9901 Segmental and somatic dysfunction of cervical region: Secondary | ICD-10-CM | POA: Diagnosis not present

## 2016-04-27 ENCOUNTER — Encounter: Payer: Self-pay | Admitting: Internal Medicine

## 2016-04-27 DIAGNOSIS — M5384 Other specified dorsopathies, thoracic region: Secondary | ICD-10-CM | POA: Diagnosis not present

## 2016-04-27 DIAGNOSIS — M9901 Segmental and somatic dysfunction of cervical region: Secondary | ICD-10-CM | POA: Diagnosis not present

## 2016-04-27 DIAGNOSIS — M9902 Segmental and somatic dysfunction of thoracic region: Secondary | ICD-10-CM | POA: Diagnosis not present

## 2016-04-27 DIAGNOSIS — M5010 Cervical disc disorder with radiculopathy, unspecified cervical region: Secondary | ICD-10-CM | POA: Diagnosis not present

## 2016-05-02 DIAGNOSIS — M9902 Segmental and somatic dysfunction of thoracic region: Secondary | ICD-10-CM | POA: Diagnosis not present

## 2016-05-02 DIAGNOSIS — M9901 Segmental and somatic dysfunction of cervical region: Secondary | ICD-10-CM | POA: Diagnosis not present

## 2016-05-02 DIAGNOSIS — M5010 Cervical disc disorder with radiculopathy, unspecified cervical region: Secondary | ICD-10-CM | POA: Diagnosis not present

## 2016-05-02 DIAGNOSIS — M5384 Other specified dorsopathies, thoracic region: Secondary | ICD-10-CM | POA: Diagnosis not present

## 2016-05-04 DIAGNOSIS — M5384 Other specified dorsopathies, thoracic region: Secondary | ICD-10-CM | POA: Diagnosis not present

## 2016-05-04 DIAGNOSIS — M5010 Cervical disc disorder with radiculopathy, unspecified cervical region: Secondary | ICD-10-CM | POA: Diagnosis not present

## 2016-05-04 DIAGNOSIS — M9902 Segmental and somatic dysfunction of thoracic region: Secondary | ICD-10-CM | POA: Diagnosis not present

## 2016-05-04 DIAGNOSIS — M9901 Segmental and somatic dysfunction of cervical region: Secondary | ICD-10-CM | POA: Diagnosis not present

## 2016-05-11 DIAGNOSIS — M5010 Cervical disc disorder with radiculopathy, unspecified cervical region: Secondary | ICD-10-CM | POA: Diagnosis not present

## 2016-05-11 DIAGNOSIS — M9901 Segmental and somatic dysfunction of cervical region: Secondary | ICD-10-CM | POA: Diagnosis not present

## 2016-05-11 DIAGNOSIS — M5384 Other specified dorsopathies, thoracic region: Secondary | ICD-10-CM | POA: Diagnosis not present

## 2016-05-11 DIAGNOSIS — M9902 Segmental and somatic dysfunction of thoracic region: Secondary | ICD-10-CM | POA: Diagnosis not present

## 2016-05-25 DIAGNOSIS — M5384 Other specified dorsopathies, thoracic region: Secondary | ICD-10-CM | POA: Diagnosis not present

## 2016-05-25 DIAGNOSIS — M9901 Segmental and somatic dysfunction of cervical region: Secondary | ICD-10-CM | POA: Diagnosis not present

## 2016-05-25 DIAGNOSIS — M9902 Segmental and somatic dysfunction of thoracic region: Secondary | ICD-10-CM | POA: Diagnosis not present

## 2016-05-25 DIAGNOSIS — M5010 Cervical disc disorder with radiculopathy, unspecified cervical region: Secondary | ICD-10-CM | POA: Diagnosis not present

## 2016-06-22 ENCOUNTER — Encounter: Payer: Self-pay | Admitting: Internal Medicine

## 2016-06-22 ENCOUNTER — Ambulatory Visit (INDEPENDENT_AMBULATORY_CARE_PROVIDER_SITE_OTHER): Payer: PPO | Admitting: Internal Medicine

## 2016-06-22 VITALS — BP 122/86 | HR 92 | Temp 98.0°F | Resp 16 | Wt 179.0 lb

## 2016-06-22 DIAGNOSIS — K219 Gastro-esophageal reflux disease without esophagitis: Secondary | ICD-10-CM | POA: Diagnosis not present

## 2016-06-22 DIAGNOSIS — M85851 Other specified disorders of bone density and structure, right thigh: Secondary | ICD-10-CM

## 2016-06-22 DIAGNOSIS — Z6833 Body mass index (BMI) 33.0-33.9, adult: Secondary | ICD-10-CM | POA: Diagnosis not present

## 2016-06-22 DIAGNOSIS — E6609 Other obesity due to excess calories: Secondary | ICD-10-CM | POA: Diagnosis not present

## 2016-06-22 DIAGNOSIS — M5020 Other cervical disc displacement, unspecified cervical region: Secondary | ICD-10-CM | POA: Diagnosis not present

## 2016-06-22 DIAGNOSIS — M810 Age-related osteoporosis without current pathological fracture: Secondary | ICD-10-CM | POA: Insufficient documentation

## 2016-06-22 DIAGNOSIS — Z0000 Encounter for general adult medical examination without abnormal findings: Secondary | ICD-10-CM

## 2016-06-22 DIAGNOSIS — M85852 Other specified disorders of bone density and structure, left thigh: Secondary | ICD-10-CM

## 2016-06-22 DIAGNOSIS — I10 Essential (primary) hypertension: Secondary | ICD-10-CM

## 2016-06-22 DIAGNOSIS — M8580 Other specified disorders of bone density and structure, unspecified site: Secondary | ICD-10-CM | POA: Insufficient documentation

## 2016-06-22 NOTE — Patient Instructions (Addendum)
Test(s) ordered today. Your results will be released to [place] (or called to you) after review, usually within 72hours after test completion. If any changes need to be made, you will be notified at that same time.  All other Health Maintenance issues reviewed.   All recommended immunizations and age-appropriate screenings are up-to-date or discussed.  No immunizations administered today.   Medications reviewed and updated.  No changes recommended at this time.   Please followup in one year for a physical   Health Maintenance, Female Adopting a healthy lifestyle and getting preventive care can go a long way to promote health and wellness. Talk with your health care provider about what schedule of regular examinations is right for you. This is a good chance for you to check in with your provider about disease prevention and staying healthy. In between checkups, there are plenty of things you can do on your own. Experts have done a lot of research about which lifestyle changes and preventive measures are most likely to keep you healthy. Ask your health care provider for more information. Weight and diet Eat a healthy diet  Be sure to include plenty of vegetables, fruits, low-fat dairy products, and lean protein.  Do not eat a lot of foods high in solid fats, added sugars, or salt.  Get regular exercise. This is one of the most important things you can do for your health. ? Most adults should exercise for at least 150 minutes each week. The exercise should increase your heart rate and make you sweat (moderate-intensity exercise). ? Most adults should also do strengthening exercises at least twice a week. This is in addition to the moderate-intensity exercise.  Maintain a healthy weight  Body mass index (BMI) is a measurement that can be used to identify possible weight problems. It estimates body fat based on height and weight. Your health care provider can help determine your BMI and help  you achieve or maintain a healthy weight.  For females 72 years of age and older: ? A BMI below 18.5 is considered underweight. ? A BMI of 18.5 to 24.9 is normal. ? A BMI of 25 to 29.9 is considered overweight. ? A BMI of 30 and above is considered obese.  Watch levels of cholesterol and blood lipids  You should start having your blood tested for lipids and cholesterol at 64 years of age, then have this test every 5 years.  You may need to have your cholesterol levels checked more often if: ? Your lipid or cholesterol levels are high. ? You are older than 64 years of age. ? You are at high risk for heart disease.  Cancer screening Lung Cancer  Lung cancer screening is recommended for adults 72-27 years old who are at high risk for lung cancer because of a history of smoking.  A yearly low-dose CT scan of the lungs is recommended for people who: ? Currently smoke. ? Have quit within the past 15 years. ? Have at least a 30-pack-year history of smoking. A pack year is smoking an average of one pack of cigarettes a day for 1 year.  Yearly screening should continue until it has been 15 years since you quit.  Yearly screening should stop if you develop a health problem that would prevent you from having lung cancer treatment.  Breast Cancer  Practice breast self-awareness. This means understanding how your breasts normally appear and feel.  It also means doing regular breast self-exams. Let your health care provider know  about any changes, no matter how small.  If you are in your 20s or 30s, you should have a clinical breast exam (CBE) by a health care provider every 1-3 years as part of a regular health exam.  If you are 7 or older, have a CBE every year. Also consider having a breast X-ray (mammogram) every year.  If you have a family history of breast cancer, talk to your health care provider about genetic screening.  If you are at high risk for breast cancer, talk to your  health care provider about having an MRI and a mammogram every year.  Breast cancer gene (BRCA) assessment is recommended for women who have family members with BRCA-related cancers. BRCA-related cancers include: ? Breast. ? Ovarian. ? Tubal. ? Peritoneal cancers.  Results of the assessment will determine the need for genetic counseling and BRCA1 and BRCA2 testing.  Cervical Cancer Your health care provider may recommend that you be screened regularly for cancer of the pelvic organs (ovaries, uterus, and vagina). This screening involves a pelvic examination, including checking for microscopic changes to the surface of your cervix (Pap test). You may be encouraged to have this screening done every 3 years, beginning at age 18.  For women ages 50-65, health care providers may recommend pelvic exams and Pap testing every 3 years, or they may recommend the Pap and pelvic exam, combined with testing for human papilloma virus (HPV), every 5 years. Some types of HPV increase your risk of cervical cancer. Testing for HPV may also be done on women of any age with unclear Pap test results.  Other health care providers may not recommend any screening for nonpregnant women who are considered low risk for pelvic cancer and who do not have symptoms. Ask your health care provider if a screening pelvic exam is right for you.  If you have had past treatment for cervical cancer or a condition that could lead to cancer, you need Pap tests and screening for cancer for at least 20 years after your treatment. If Pap tests have been discontinued, your risk factors (such as having a new sexual partner) need to be reassessed to determine if screening should resume. Some women have medical problems that increase the chance of getting cervical cancer. In these cases, your health care provider may recommend more frequent screening and Pap tests.  Colorectal Cancer  This type of cancer can be detected and often  prevented.  Routine colorectal cancer screening usually begins at 64 years of age and continues through 64 years of age.  Your health care provider may recommend screening at an earlier age if you have risk factors for colon cancer.  Your health care provider may also recommend using home test kits to check for hidden blood in the stool.  A small camera at the end of a tube can be used to examine your colon directly (sigmoidoscopy or colonoscopy). This is done to check for the earliest forms of colorectal cancer.  Routine screening usually begins at age 22.  Direct examination of the colon should be repeated every 5-10 years through 64 years of age. However, you may need to be screened more often if early forms of precancerous polyps or small growths are found.  Skin Cancer  Check your skin from head to toe regularly.  Tell your health care provider about any new moles or changes in moles, especially if there is a change in a mole's shape or color.  Also tell your health care  provider if you have a mole that is larger than the size of a pencil eraser.  Always use sunscreen. Apply sunscreen liberally and repeatedly throughout the day.  Protect yourself by wearing long sleeves, pants, a wide-brimmed hat, and sunglasses whenever you are outside.  Heart disease, diabetes, and high blood pressure  High blood pressure causes heart disease and increases the risk of stroke. High blood pressure is more likely to develop in: ? People who have blood pressure in the high end of the normal range (130-139/85-89 mm Hg). ? People who are overweight or obese. ? People who are African American.  If you are 27-36 years of age, have your blood pressure checked every 3-5 years. If you are 47 years of age or older, have your blood pressure checked every year. You should have your blood pressure measured twice-once when you are at a hospital or clinic, and once when you are not at a hospital or clinic.  Record the average of the two measurements. To check your blood pressure when you are not at a hospital or clinic, you can use: ? An automated blood pressure machine at a pharmacy. ? A home blood pressure monitor.  If you are between 58 years and 91 years old, ask your health care provider if you should take aspirin to prevent strokes.  Have regular diabetes screenings. This involves taking a blood sample to check your fasting blood sugar level. ? If you are at a normal weight and have a low risk for diabetes, have this test once every three years after 64 years of age. ? If you are overweight and have a high risk for diabetes, consider being tested at a younger age or more often. Preventing infection Hepatitis B  If you have a higher risk for hepatitis B, you should be screened for this virus. You are considered at high risk for hepatitis B if: ? You were born in a country where hepatitis B is common. Ask your health care provider which countries are considered high risk. ? Your parents were born in a high-risk country, and you have not been immunized against hepatitis B (hepatitis B vaccine). ? You have HIV or AIDS. ? You use needles to inject street drugs. ? You live with someone who has hepatitis B. ? You have had sex with someone who has hepatitis B. ? You get hemodialysis treatment. ? You take certain medicines for conditions, including cancer, organ transplantation, and autoimmune conditions.  Hepatitis C  Blood testing is recommended for: ? Everyone born from [birth year] through 1963. ? Anyone with known risk factors for hepatitis C.  Sexually transmitted infections (STIs)  You should be screened for sexually transmitted infections (STIs) including gonorrhea and chlamydia if: ? You are sexually active and are younger than 65 years of age. ? You are older than 64 years of age and your health care provider tells you that you are at risk for this type of infection. ? Your sexual  activity has changed since you were last screened and you are at an increased risk for chlamydia or gonorrhea. Ask your health care provider if you are at risk.  If you do not have HIV, but are at risk, it may be recommended that you take a prescription medicine daily to prevent HIV infection. This is called pre-exposure prophylaxis (PrEP). You are considered at risk if: ? You are sexually active and do not regularly use condoms or know the HIV status of your partner(s). ? You take  drugs by injection. ? You are sexually active with a partner who has HIV.  Talk with your health care provider about whether you are at high risk of being infected with HIV. If you choose to begin PrEP, you should first be tested for HIV. You should then be tested every 3 months for as long as you are taking PrEP. Pregnancy  If you are premenopausal and you may become pregnant, ask your health care provider about preconception counseling.  If you may become pregnant, take 400 to 800 micrograms (mcg) of folic acid every day.  If you want to prevent pregnancy, talk to your health care provider about birth control (contraception). Osteoporosis and menopause  Osteoporosis is a disease in which the bones lose minerals and strength with aging. This can result in serious bone fractures. Your risk for osteoporosis can be identified using a bone density scan.  If you are 80 years of age or older, or if you are at risk for osteoporosis and fractures, ask your health care provider if you should be screened.  Ask your health care provider whether you should take a calcium or vitamin D supplement to lower your risk for osteoporosis.  Menopause may have certain physical symptoms and risks.  Hormone replacement therapy may reduce some of these symptoms and risks. Talk to your health care provider about whether hormone replacement therapy is right for you. Follow these instructions at home:  Schedule regular health, dental,  and eye exams.  Stay current with your immunizations.  Do not use any tobacco products including cigarettes, chewing tobacco, or electronic cigarettes.  If you are pregnant, do not drink alcohol.  If you are breastfeeding, limit how much and how often you drink alcohol.  Limit alcohol intake to no more than 1 drink per day for nonpregnant women. One drink equals 12 ounces of beer, 5 ounces of wine, or 1 ounces of hard liquor.  Do not use street drugs.  Do not share needles.  Ask your health care provider for help if you need support or information about quitting drugs.  Tell your health care provider if you often feel depressed.  Tell your health care provider if you have ever been abused or do not feel safe at home. This information is not intended to replace advice given to you by your health care provider. Make sure you discuss any questions you have with your health care provider. Document Released: 07/12/2010 Document Revised: 06/04/2015 Document Reviewed: 09/30/2014 Elsevier Interactive Patient Education  [employer].

## 2016-06-22 NOTE — Assessment & Plan Note (Signed)
She has been taking peppermint in a capsule form GERD is controlled

## 2016-06-22 NOTE — Assessment & Plan Note (Signed)
Stressed regularly exercise and work on decreasing portions and improving diet

## 2016-06-22 NOTE — Assessment & Plan Note (Signed)
BP well controlled Current regimen effective and well tolerated Continue current medications at current doses  

## 2016-06-22 NOTE — Assessment & Plan Note (Signed)
Daily pain  Seeing a chiropractor and has been adjusted and has had significant decrease in pain Pain done to 0 or 4/10.  Uses flexeril as needed Takes advil pm, excedrin or goody powder as needed

## 2016-06-22 NOTE — Assessment & Plan Note (Signed)
dexa up to date Stressed regular walking Taking cal/vit d

## 2016-06-22 NOTE — Progress Notes (Signed)
Subjective:    Patient ID: Colleen Walsh, female    DOB: Apr 03, 1950, 66 y.o.   MRN: [id number]  HPI She is here for a physical exam.   She has seen a chiropractor for her chronic neck pain and it has helped significantly.    She has gained weight.  She has not been exercising.  She did good with her diet until christmas.  She has not done well since then.   She denies any concerns.   Medications and allergies reviewed with patient and updated if appropriate.  Patient Active Problem List   Diagnosis Date Noted  . Osteopenia 06/22/2016  . Schatzki's ring 08/18/2015  . Hiatal hernia, sm 08/18/2015  . Stricture and stenosis of esophagus 06/22/2015  . Cervical herniated disc 06/22/2015  . Eczema 06/22/2015  . Osteoarthritis of right knee 06/15/2015  . Obese 12/15/2014  . Dyslipidemia 05/19/2008  . Essential hypertension 05/19/2008  . VENEREAL WART 04/04/2008  . Cancer of skin, squamous cell 04/04/2008  . GERD 04/04/2008    Current Outpatient Prescriptions on File Prior to Visit  Medication Sig Dispense Refill  . amLODipine (NORVASC) 5 MG tablet Take 1 tablet (5 mg total) by mouth daily. 90 tablet 3  . Ascorbic Acid (VITAMIN C) 1000 MG tablet Take 1,000 mg by mouth daily.      . Calcium-Vitamin D (CALTRATE 600 PLUS-VIT D PO) Take 1 each by mouth daily.      . cyclobenzaprine (FLEXERIL) 10 MG tablet TAKE 1 TABLET (10 MG TOTAL) BY MOUTH 2 (TWO) TIMES DAILY AS NEEDED. 60 tablet 1  . fluorometholone (FML) 0.1 % ophthalmic suspension     . Multiple Vitamins-Minerals (MULTIVITAMIN,TX-MINERALS) tablet Take 1 tablet by mouth daily.      . sucralfate (CARAFATE) 1 GM/10ML suspension Take 10 mLs (1 g total) by mouth 4 (four) times daily -  with meals and at bedtime. 420 mL 1   No current facility-administered medications on file prior to visit.     Past Medical History:  Diagnosis Date  . CONTACT DERMATITIS&OTHER ECZEMA DUE UNSPEC CAUSE   . DEGENERATIVE JOINT DISEASE,  CERVICAL SPINE   . DYSLIPIDEMIA   . GERD   . GLUCOSE INTOLERANCE, HX OF   . HYPERTENSION   . Tear of LCL (lateral collateral ligament) of knee 08/2014   R knee pain, dx on [place], s/p IAC injection  . VENEREAL WART     Past Surgical History:  Procedure Laterality Date  . [place] STUDY N/A 09/28/2015   Procedure: [place] STUDY;  Surgeon: Doran Stabler, MD;  Location: [facility];  Service: Gastroenterology;  Laterality: N/A;  . broken left foot  2006  . CHOLECYSTECTOMY  1992  . ESOPHAGEAL MANOMETRY N/A 09/28/2015   Procedure: ESOPHAGEAL MANOMETRY (EM);  Surgeon: Doran Stabler, MD;  Location: [facility];  Service: Gastroenterology;  Laterality: N/A;  IMPEADANCE   . MOHS SURGERY  2001    Social History   Social History  . Marital status: Married    Spouse name: N/A  . Number of children: N/A  . Years of education: N/A   Social History Main Topics  . Smoking status: Never Smoker  . Smokeless tobacco: Never Used  . Alcohol use No  . Drug use: No  . Sexual activity: Not Currently   Other Topics Concern  . None   Social History Narrative   Married, lives with spouse. disabled due to neck pain ([place])  Walks for exercise    Family History  Problem Relation Age of Onset  . Colon cancer Father   . Diabetes Father   . Stroke Father   . Colon cancer Brother   . Depression Mother   . Alcohol abuse Mother   . Heart attack Mother 59       SMOKER  . Alcohol abuse Sister   . Depression Sister   . Alcohol abuse Brother   . Depression Brother   . Alcohol abuse Other        parents  . Arthritis Other        grandparent  . Hypertension Other        parent, other relative  . Stroke Other        parent, other relative  . Heart attack Cousin     Review of Systems  Constitutional: Negative for chills and fever.  Eyes: Negative for visual disturbance.  Respiratory: Negative for cough, shortness of breath and wheezing.   Cardiovascular: Positive for leg  swelling (feet swell in summer). Negative for chest pain and palpitations.  Gastrointestinal: Negative for abdominal pain, blood in stool, constipation, diarrhea and nausea.  Genitourinary: Negative for dysuria and hematuria.  Musculoskeletal: Positive for arthralgias (knee pain) and neck pain.  Skin: Negative for color change and rash.  Neurological: Negative for light-headedness and headaches.  Psychiatric/Behavioral: Negative for dysphoric mood. The patient is not nervous/anxious.        Objective:   Vitals:   06/22/16 1330  BP: 122/86  Pulse: 92  Resp: 16  Temp: 98 F (36.7 C)   Filed Weights   06/22/16 1330  Weight: 179 lb (81.2 kg)   Body mass index is 33.82 kg/m.  Wt Readings from Last 3 Encounters:  06/22/16 179 lb (81.2 kg)  09/04/15 169 lb 8 oz (76.9 kg)  08/13/15 169 lb (76.7 kg)     Physical Exam Constitutional: She appears well-developed and well-nourished. No distress.  HENT:  Head: Normocephalic and atraumatic.  Right Ear: External ear normal. Normal ear canal and TM Left Ear: External ear normal.  Normal ear canal and TM Mouth/Throat: Oropharynx is clear and moist.  Eyes: Conjunctivae and EOM are normal.  Neck: Neck supple. No tracheal deviation present. No thyromegaly present.  No carotid bruit  Cardiovascular: Normal rate, regular rhythm and normal heart sounds.   No murmur heard.  No edema. Pulmonary/Chest: Effort normal and breath sounds normal. No respiratory distress. She has no wheezes. She has no rales.  Breast: deferred to Gyn Abdominal: Soft. She exhibits no distension. There is no tenderness.  Lymphadenopathy: She has no cervical adenopathy.  Skin: Skin is warm and dry. She is not diaphoretic.  Psychiatric: She has a normal mood and affect. Her behavior is normal.         Assessment & Plan:   Physical exam: Screening blood work   ordered Immunizations  Up to date  - discussed shingrix - wait until 2020  -had zostavax  06/2013) Colonoscopy  Up to date  Mammogram   Up to date  Gyn  Up to date  Dexa   Up to date  Eye exams    Up to date  EKG   Last done 2014-We will repeat one today-EKG today shows sinus rhythm at 82 bpm, possible left atrial enlargement and low voltage, left atrial enlargement is more pronounced since last EKG done in 2014. Low voltage is new.  No acute changes  Exercise  Not exercising -  stressed regular exercise Weight - stressed weight loss Skin    No concerns; sees derm annually - h/o skin cancer Substance abuse   none  See Problem List for Assessment and Plan of chronic medical problems.   FU in one year

## 2016-06-23 ENCOUNTER — Other Ambulatory Visit (INDEPENDENT_AMBULATORY_CARE_PROVIDER_SITE_OTHER): Payer: PPO

## 2016-06-23 DIAGNOSIS — Z0000 Encounter for general adult medical examination without abnormal findings: Secondary | ICD-10-CM

## 2016-06-23 DIAGNOSIS — R7989 Other specified abnormal findings of blood chemistry: Secondary | ICD-10-CM

## 2016-06-23 DIAGNOSIS — I10 Essential (primary) hypertension: Secondary | ICD-10-CM

## 2016-06-23 LAB — CBC WITH DIFFERENTIAL/PLATELET
BASOS PCT: 1.2 % (ref 0.0–3.0)
Basophils Absolute: 0.1 10*3/uL (ref 0.0–0.1)
EOS ABS: 0.1 10*3/uL (ref 0.0–0.7)
EOS PCT: 1.9 % (ref 0.0–5.0)
HCT: 40 % (ref 36.0–46.0)
HEMOGLOBIN: 13.7 g/dL (ref 12.0–15.0)
LYMPHS ABS: 2.3 10*3/uL (ref 0.7–4.0)
Lymphocytes Relative: 37.6 % (ref 12.0–46.0)
MCHC: 34.2 g/dL (ref 30.0–36.0)
MCV: 89.2 fl (ref 78.0–100.0)
MONO ABS: 0.5 10*3/uL (ref 0.1–1.0)
Monocytes Relative: 8.2 % (ref 3.0–12.0)
NEUTROS ABS: 3.1 10*3/uL (ref 1.4–7.7)
NEUTROS PCT: 51.1 % (ref 43.0–77.0)
PLATELETS: 382 10*3/uL (ref 150.0–400.0)
RBC: 4.49 Mil/uL (ref 3.87–5.11)
RDW: 13 % (ref 11.5–15.5)
WBC: 6 10*3/uL (ref 4.0–10.5)

## 2016-06-23 LAB — LIPID PANEL
CHOL/HDL RATIO: 6
CHOLESTEROL: 225 mg/dL — AB (ref 0–200)
HDL: 37.5 mg/dL — ABNORMAL LOW (ref >39.00)
NONHDL: 187.22
Triglycerides: 229 mg/dL — ABNORMAL HIGH (ref 0.0–149.0)
VLDL: 45.8 mg/dL — AB (ref 0.0–40.0)

## 2016-06-23 LAB — COMPREHENSIVE METABOLIC PANEL
ALBUMIN: 4 g/dL (ref 3.5–5.2)
ALT: 11 U/L (ref 0–35)
AST: 10 U/L (ref 0–37)
Alkaline Phosphatase: 99 U/L (ref 39–117)
BUN: 7 mg/dL (ref 6–23)
CHLORIDE: 101 meq/L (ref 96–112)
CO2: 28 meq/L (ref 19–32)
CREATININE: 0.64 mg/dL (ref 0.40–1.20)
Calcium: 9.6 mg/dL (ref 8.4–10.5)
GFR: 99.42 mL/min (ref >60.00)
Glucose, Bld: 101 mg/dL — ABNORMAL HIGH (ref 70–99)
POTASSIUM: 4 meq/L (ref 3.5–5.1)
SODIUM: 136 meq/L (ref 135–145)
Total Bilirubin: 0.6 mg/dL (ref 0.2–1.2)
Total Protein: 7.2 g/dL (ref 6.0–8.3)

## 2016-06-23 LAB — LDL CHOLESTEROL, DIRECT: Direct LDL: 134 mg/dL

## 2016-06-23 LAB — TSH: TSH: 1.13 u[IU]/mL (ref 0.35–4.50)

## 2016-06-25 ENCOUNTER — Encounter: Payer: Self-pay | Admitting: Internal Medicine

## 2016-07-28 ENCOUNTER — Other Ambulatory Visit: Payer: Self-pay | Admitting: Internal Medicine

## 2016-07-28 ENCOUNTER — Telehealth: Payer: Self-pay | Admitting: Internal Medicine

## 2016-07-28 MED ORDER — CYCLOBENZAPRINE HCL 10 MG PO TABS: 10.0000 mg | ORAL_TABLET | Freq: Two times a day (BID) | ORAL | 1 refills | Status: DC | PRN

## 2016-07-28 NOTE — Telephone Encounter (Signed)
LVM informing pt, informed pt that is not a medication that we can send in a year supply for.

## 2016-07-28 NOTE — Telephone Encounter (Signed)
Pt called stating when she had her CPE she was supposed to receive a refill for a year for cyclobenzaprine (FLEXERIL) 10 MG tablet  [facility] on [address]

## 2016-09-19 DIAGNOSIS — Z1231 Encounter for screening mammogram for malignant neoplasm of breast: Secondary | ICD-10-CM | POA: Diagnosis not present

## 2016-12-09 ENCOUNTER — Encounter: Payer: Self-pay | Admitting: Physician Assistant

## 2016-12-09 ENCOUNTER — Ambulatory Visit (INDEPENDENT_AMBULATORY_CARE_PROVIDER_SITE_OTHER): Payer: PPO | Admitting: Physician Assistant

## 2016-12-09 VITALS — BP 120/70 | HR 92 | Ht 61.0 in | Wt 184.6 lb

## 2016-12-09 DIAGNOSIS — K219 Gastro-esophageal reflux disease without esophagitis: Secondary | ICD-10-CM

## 2016-12-09 DIAGNOSIS — R1310 Dysphagia, unspecified: Secondary | ICD-10-CM | POA: Diagnosis not present
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
## 2016-12-09 NOTE — Patient Instructions (Addendum)
You have been scheduled for a Barium Esophogram at [facility] (1st floor of the hospital) on 12/17/16 at 10:30 am. Please arrive 15 minutes prior to your appointment for registration. Make certain not to have anything to eat or drink 3 hours prior to your test. If you need to reschedule for any reason, please contact radiology at [phone] to do so. __________________________________________________________________ A barium swallow is an examination that concentrates on views of the esophagus. This tends to be a double contrast exam (barium and two liquids which, when combined, create a gas to distend the wall of the oesophagus) or single contrast (non-ionic iodine based). The study is usually tailored to your symptoms so a good history is essential. Attention is paid during the study to the form, structure and configuration of the esophagus, looking for functional disorders (such as aspiration, dysphagia, achalasia, motility and reflux) EXAMINATION You may be asked to change into a gown, depending on the type of swallow being performed. A radiologist and radiographer will perform the procedure. The radiologist will advise you of the type of contrast selected for your procedure and direct you during the exam. You will be asked to stand, sit or lie in several different positions and to hold a small amount of fluid in your mouth before being asked to swallow while the imaging is performed .In some instances you may be asked to swallow barium coated marshmallows to assess the motility of a solid food bolus. The exam can be recorded as a digital or video fluoroscopy procedure. POST PROCEDURE It will take 1-2 days for the barium to pass through your system. To facilitate this, it is important, unless otherwise directed, to increase your fluids for the next 24-48hrs and to resume your normal diet.  This test typically takes about 30 minutes to  perform. __________________________________________________________________________________

## 2016-12-09 NOTE — Progress Notes (Signed)
Thank you for sending this case to me. I have reviewed the entire note, and the outlined plan seems appropriate.  A barium study is reasonable. There was previously no identifiable structural or manometric cause for these symptoms.   Wilfrid Lund, MD

## 2016-12-15 ENCOUNTER — Ambulatory Visit (HOSPITAL_COMMUNITY)
Admission: RE | Admit: 2016-12-15 | Discharge: 2016-12-15 | Disposition: A | Discharge status: Home / Self Care | Payer: PPO | Source: Ambulatory Visit | Attending: Physician Assistant | Admitting: Physician Assistant

## 2016-12-15 DIAGNOSIS — K222 Esophageal obstruction: Secondary | ICD-10-CM | POA: Insufficient documentation

## 2016-12-15 DIAGNOSIS — K449 Diaphragmatic hernia without obstruction or gangrene: Secondary | ICD-10-CM | POA: Diagnosis not present

## 2016-12-15 DIAGNOSIS — K219 Gastro-esophageal reflux disease without esophagitis: Secondary | ICD-10-CM

## 2016-12-15 DIAGNOSIS — R1310 Dysphagia, unspecified: Secondary | ICD-10-CM

## 2016-12-21 ENCOUNTER — Encounter: Payer: Self-pay | Admitting: Obstetrics & Gynecology

## 2016-12-29 ENCOUNTER — Ambulatory Visit (AMBULATORY_SURGERY_CENTER): Payer: Self-pay

## 2016-12-29 VITALS — Ht 61.0 in | Wt 185.6 lb

## 2016-12-29 DIAGNOSIS — R1310 Dysphagia, unspecified: Secondary | ICD-10-CM

## 2016-12-29 NOTE — Progress Notes (Signed)
Per pt, no allergies to egg products, but SOY causes acid reflux! Pt not taking any weight loss meds or using  O2 at home.  Pt refused emmi video.

## 2017-01-16 ENCOUNTER — Encounter: Payer: Self-pay | Admitting: Gastroenterology

## 2017-01-16 ENCOUNTER — Ambulatory Visit (AMBULATORY_SURGERY_CENTER): Payer: PPO | Admitting: Gastroenterology

## 2017-01-16 VITALS — BP 106/51 | HR 92 | Temp 99.1°F | Resp 15 | Ht 61.0 in | Wt 185.0 lb

## 2017-01-16 DIAGNOSIS — K222 Esophageal obstruction: Secondary | ICD-10-CM | POA: Diagnosis not present

## 2017-01-16 DIAGNOSIS — R1310 Dysphagia, unspecified: Secondary | ICD-10-CM | POA: Diagnosis not present

## 2017-01-16 DIAGNOSIS — I10 Essential (primary) hypertension: Secondary | ICD-10-CM | POA: Diagnosis not present

## 2017-01-16 DIAGNOSIS — R1319 Other dysphagia: Secondary | ICD-10-CM

## 2017-01-16 DIAGNOSIS — K219 Gastro-esophageal reflux disease without esophagitis: Secondary | ICD-10-CM | POA: Diagnosis not present

## 2017-01-16 MED ORDER — SODIUM CHLORIDE 0.9 % IV SOLN: 500.0000 mL | Freq: Once | INTRAVENOUS | Status: DC

## 2017-01-16 NOTE — Op Note (Signed)
[place] Patient Name: Cynitha Berte Procedure Date: 01/16/2017 4:00 PM MRN: [id number] Endoscopist: Mallie Mussel L. Loletha Carrow , MD Age: 67 Referring MD:  Date of Birth: 28-Jun-1950 Gender: Female Account #: [id number] Procedure:                Upper GI endoscopy Indications:              Dysphagia, Abnormal barium esophogram suggesting                            stricture Medicines:                Monitored Anesthesia Care Procedure:                Pre-Anesthesia Assessment:                           - Prior to the procedure, a History and Physical                            was performed, and patient medications and                            allergies were reviewed. The patient's tolerance of                            previous anesthesia was also reviewed. The risks                            and benefits of the procedure and the sedation                            options and risks were discussed with the patient.                            All questions were answered, and informed consent                            was obtained. Prior Anticoagulants: The patient has                            taken no previous anticoagulant or antiplatelet                            agents. ASA Grade Assessment: II - A patient with                            mild systemic disease. After reviewing the risks                            and benefits, the patient was deemed in                            satisfactory condition to undergo the procedure.  After obtaining informed consent, the endoscope was                            passed under direct vision. Throughout the                            procedure, the patient's blood pressure, pulse, and                            oxygen saturations were monitored continuously. The                            Model GIF-[id number]) scope was introduced                            through the mouth, and advanced to the  second part                            of duodenum. The upper GI endoscopy was                            accomplished without difficulty. The patient                            tolerated the procedure well. Scope In: Scope Out: Findings:                 A small hiatal hernia was present.                           One moderate benign-appearing,ulcerated,                            reflux-induced stenosis was found at the EG                            junction. This measured 1.2 cm (inner diameter) and                            was traversed. A TTS dilator was passed through the                            scope. Dilation with a 16-17-18 mm balloon dilator                            was performed to 17 mm. The dilation site was                            examined and showed moderate improvement in luminal                            narrowing.                           The stomach was normal.  The cardia and gastric fundus were normal on                            retroflexion.                           The examined duodenum was normal. Complications:            No immediate complications. Estimated Blood Loss:     Estimated blood loss was minimal. Impression:               - Small hiatal hernia.                           - Benign-appearing esophageal stenosis. Dilated.                           - Normal stomach.                           - Normal examined duodenum.                           - No specimens collected.                           This patient has had adverse effects from multiple                            PPIs. Ranitidine reportedtly did not sufficiently                            improve reflux symptoms. However, using ranitidine                            300 mg twice daily should be seriously                            re-considered to help this stricture heal, improve                            dysphagia, and decrease risk of an esophageal food                             impaction. If that is successful, consideration                            should be given to fundoplication. Recommendation:           - Patient has a contact number available for                            emergencies. The signs and symptoms of potential                            delayed complications were discussed with the  patient. Return to normal activities tomorrow.                            Written discharge instructions were provided to the                            patient.                           - Resume previous diet, being careful to cut and                            chew food (especially meat) very well.                           - Continue present medications. Keara Pagliarulo L. Loletha Carrow, MD 01/16/2017 4:27:28 PM This report has been signed electronically.

## 2017-01-16 NOTE — Patient Instructions (Addendum)
YOU HAD AN ENDOSCOPIC PROCEDURE TODAY AT [facility]:   Refer to the procedure report that was given to you for any specific questions about what was found during the examination.  If the procedure report does not answer your questions, please call your gastroenterologist to clarify.  If you requested that your care partner not be given the details of your procedure findings, then the procedure report has been included in a sealed envelope for you to review at your convenience later.  YOU SHOULD EXPECT: Some feelings of bloating in the abdomen. Passage of more gas than usual.  Walking can help get rid of the air that was put into your GI tract during the procedure and reduce the bloating. If you had a lower endoscopy (such as a colonoscopy or flexible sigmoidoscopy) you may notice spotting of blood in your stool or on the toilet paper. If you underwent a bowel prep for your procedure, you may not have a normal bowel movement for a few days.  Please Note:  You might notice some irritation and congestion in your nose or some drainage.  This is from the oxygen used during your procedure.  There is no need for concern and it should clear up in a day or so.  SYMPTOMS TO REPORT IMMEDIATELY:   Following upper endoscopy (EGD)  Vomiting of blood or coffee ground material  New chest pain or pain under the shoulder blades  Painful or persistently difficult swallowing  New shortness of breath  Fever of 100F or higher  Black, tarry-looking stools  For urgent or emergent issues, a gastroenterologist can be reached at any hour by calling [phone].   DIET:  We do recommend a small meal at first, but then you may proceed to your regular diet. Be careful to cut and chew food (especially meat) very well.  Drink plenty of fluids but you should avoid alcoholic beverages for 24 hours.  MEDICATIONS: Continue present medications. Try Ranitidine (generic Zantac) 300 mg by mouth twice daily to  determine tolerance and call Dr. Loletha Carrow to let him know how it is working for you. You can purchase this over the counter.  ACTIVITY:  You should plan to take it easy for the rest of today and you should NOT DRIVE or use heavy machinery until tomorrow (because of the sedation medicines used during the test).    FOLLOW UP: Our staff will call the number listed on your records the next business day following your procedure to check on you and address any questions or concerns that you may have regarding the information given to you following your procedure. If we do not reach you, we will leave a message.  However, if you are feeling well and you are not experiencing any problems, there is no need to return our call.  We will assume that you have returned to your regular daily activities without incident.  If any biopsies were taken you will be contacted by phone or by letter within the next 1-3 weeks.  Please call us at [phone] if you have not heard about the biopsies in 3 weeks.   Thank you for allowing [place] to provide for your healthcare needs today.  SIGNATURES/CONFIDENTIALITY: You and/or your care partner have signed paperwork which will be entered into your electronic medical record.  These signatures attest to the fact that that the information above on your After Visit Summary has been reviewed and is understood.  Full responsibility of the confidentiality  of this discharge information lies with you and/or your care-partner.

## 2017-01-16 NOTE — Progress Notes (Signed)
Report given to PACU, vss 

## 2017-01-16 NOTE — Progress Notes (Signed)
Called to room to assist during endoscopic procedure.  Patient ID and intended procedure confirmed with present staff. Received instructions for my participation in the procedure from the performing physician.  

## 2017-01-17 ENCOUNTER — Telehealth: Payer: Self-pay

## 2017-01-17 NOTE — Telephone Encounter (Signed)
  Follow up Call-  Call back number 01/16/2017 08/13/2015  Post procedure Call Back phone  # [phone]  Permission to leave phone message Yes Yes  Some recent data might be hidden     Patient questions:  Do you have a fever, pain , or abdominal swelling? No. Pain Score  0 *  Have you tolerated food without any problems? Yes.    Have you been able to return to your normal activities? Yes.    Do you have any questions about your discharge instructions: Diet   No. Medications  No. Follow up visit  No.  Do you have questions or concerns about your Care? No.  Actions: * If pain score is 4 or above: No action needed, pain <4.

## 2017-01-24 ENCOUNTER — Other Ambulatory Visit: Payer: Self-pay

## 2017-01-24 ENCOUNTER — Telehealth: Payer: Self-pay | Admitting: Gastroenterology

## 2017-01-24 MED ORDER — RANITIDINE HCL 150 MG PO TABS: 300.0000 mg | ORAL_TABLET | Freq: Two times a day (BID) | ORAL | 6 refills | Status: DC

## 2017-01-24 NOTE — Telephone Encounter (Signed)
Yes, I think she should continue it indefinitely.  Please send prescription for ranitidine 150 mg, take two tablets twice daily.  Disp #[id number], RF 6  I want her to let me know if she changes her mind about seeing a surgeon at some point regarding her reflux.

## 2017-01-24 NOTE — Telephone Encounter (Signed)
Patient called to report that she has been taking Ranitidine 150 mg BID and having no problems. She wondered if she should be taking this indefinitely?

## 2017-01-24 NOTE — Telephone Encounter (Signed)
Left detailed message for patient that Rx has been sent to her pharmacy, increase in dosage. Also if she changes her mind about the referral to a surgeon all she needs to do is call our office.

## 2017-01-27 ENCOUNTER — Telehealth: Payer: Self-pay | Admitting: Gastroenterology

## 2017-01-27 NOTE — Telephone Encounter (Signed)
Patient states she had a reaction to medication zantac when she took the 2 tablets twice a day. Pt says she did fine with 1 tablet twice a day, so would like to know what to do.

## 2017-01-27 NOTE — Telephone Encounter (Addendum)
Understood and agreed. 

## 2017-01-27 NOTE — Telephone Encounter (Signed)
Patient took Zantac 300 mg yesterday morning and states her throat felt "scratchy and sore" did not take any more after that, feels better today.Patient states she was doing better with one tablet twice daily of the OTC Zantac 150 mg, suggested she go back to using that and if has a problem to call office.

## 2017-01-30 ENCOUNTER — Encounter: Payer: Self-pay | Admitting: Obstetrics & Gynecology

## 2017-03-03 ENCOUNTER — Encounter: Payer: Self-pay | Admitting: Obstetrics & Gynecology

## 2017-03-06 DIAGNOSIS — Z1283 Encounter for screening for malignant neoplasm of skin: Secondary | ICD-10-CM | POA: Diagnosis not present

## 2017-03-06 DIAGNOSIS — L218 Other seborrheic dermatitis: Secondary | ICD-10-CM | POA: Diagnosis not present

## 2017-03-06 DIAGNOSIS — L821 Other seborrheic keratosis: Secondary | ICD-10-CM | POA: Diagnosis not present

## 2017-04-19 DIAGNOSIS — M25562 Pain in left knee: Secondary | ICD-10-CM | POA: Diagnosis not present

## 2017-05-04 ENCOUNTER — Other Ambulatory Visit: Payer: Self-pay | Admitting: Internal Medicine

## 2017-05-04 DIAGNOSIS — H04123 Dry eye syndrome of bilateral lacrimal glands: Secondary | ICD-10-CM | POA: Diagnosis not present

## 2017-05-04 DIAGNOSIS — H2513 Age-related nuclear cataract, bilateral: Secondary | ICD-10-CM | POA: Diagnosis not present

## 2017-05-04 DIAGNOSIS — H35371 Puckering of macula, right eye: Secondary | ICD-10-CM | POA: Diagnosis not present

## 2017-05-04 DIAGNOSIS — H35033 Hypertensive retinopathy, bilateral: Secondary | ICD-10-CM | POA: Diagnosis not present

## 2017-05-04 LAB — HM DIABETES EYE EXAM

## 2017-05-16 ENCOUNTER — Encounter: Payer: Self-pay | Admitting: Internal Medicine

## 2017-05-16 IMAGING — CR DG KNEE 1-2V*R*
2 series · 2 of 2 positions shown · non-contrast
Comparison: None available

CLINICAL DATA: Anterior knee pain for 2 weeks with swelling.

EXAM:
RIGHT KNEE - 1-2 VIEW

[view not recorded (1 of 2)]
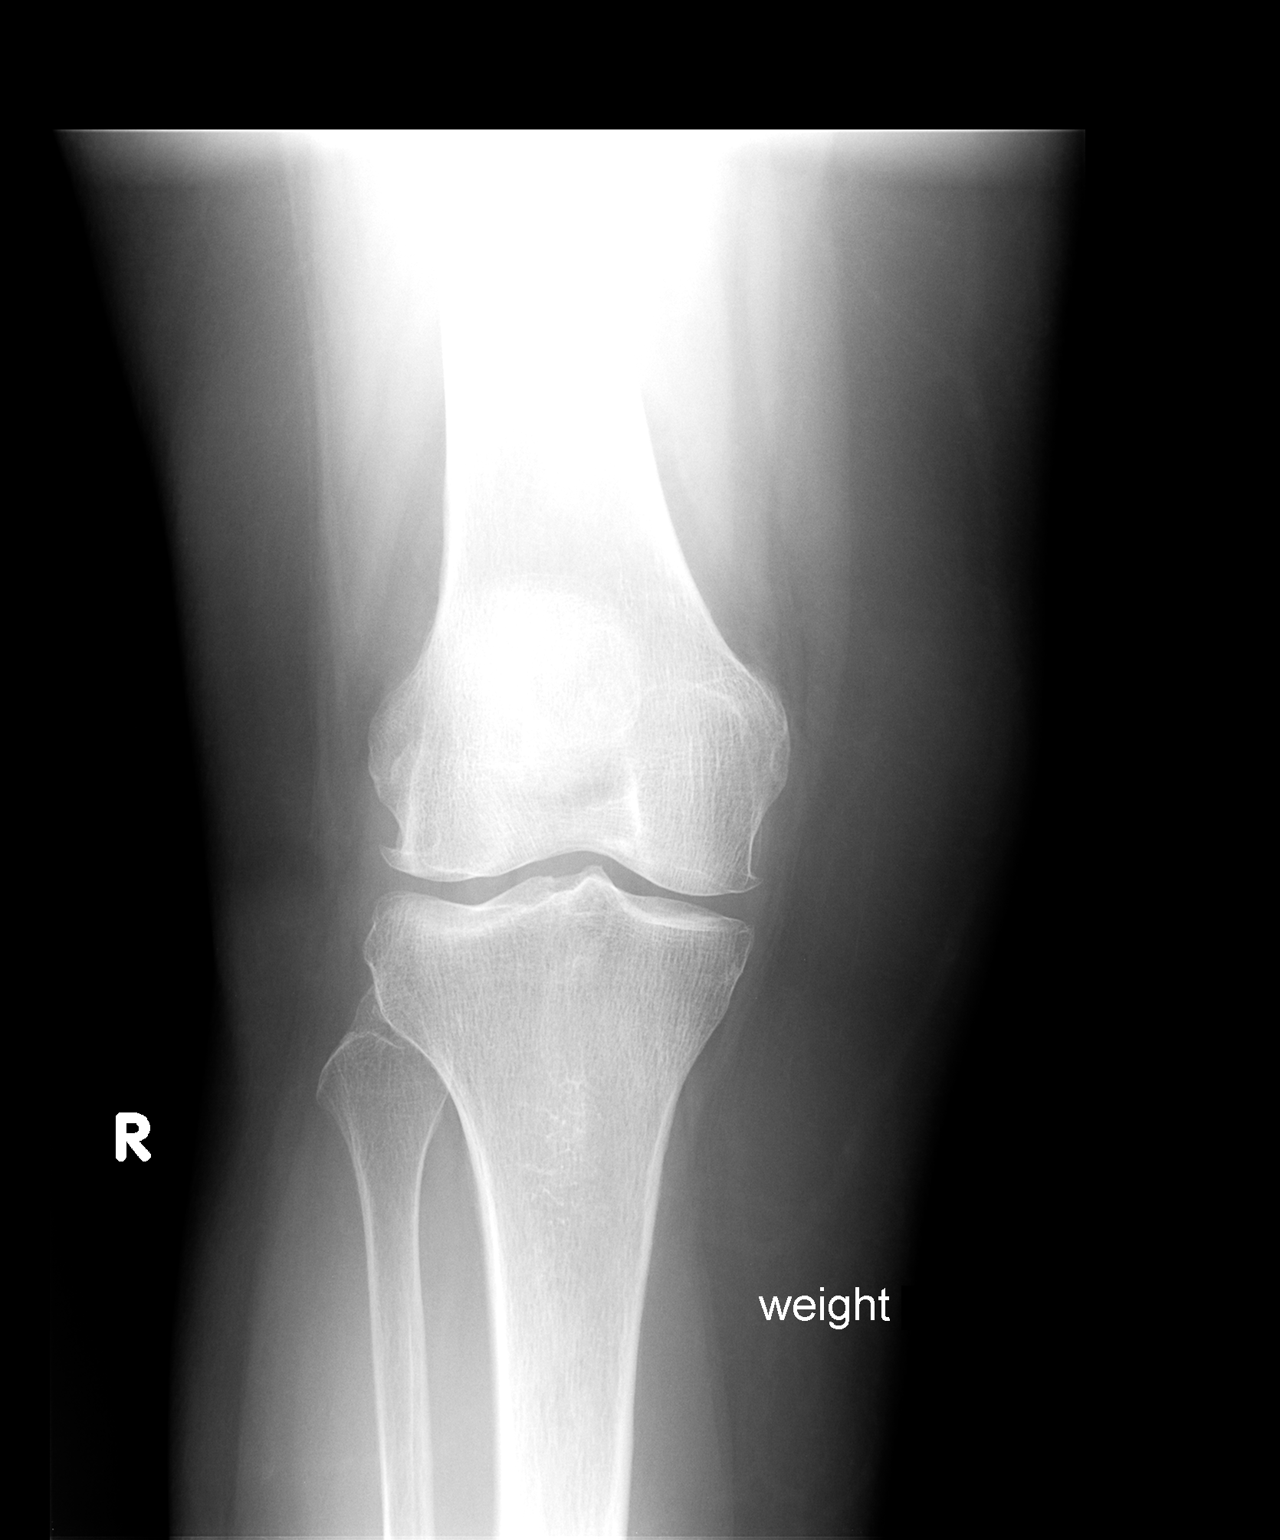

[view not recorded (2 of 2)]
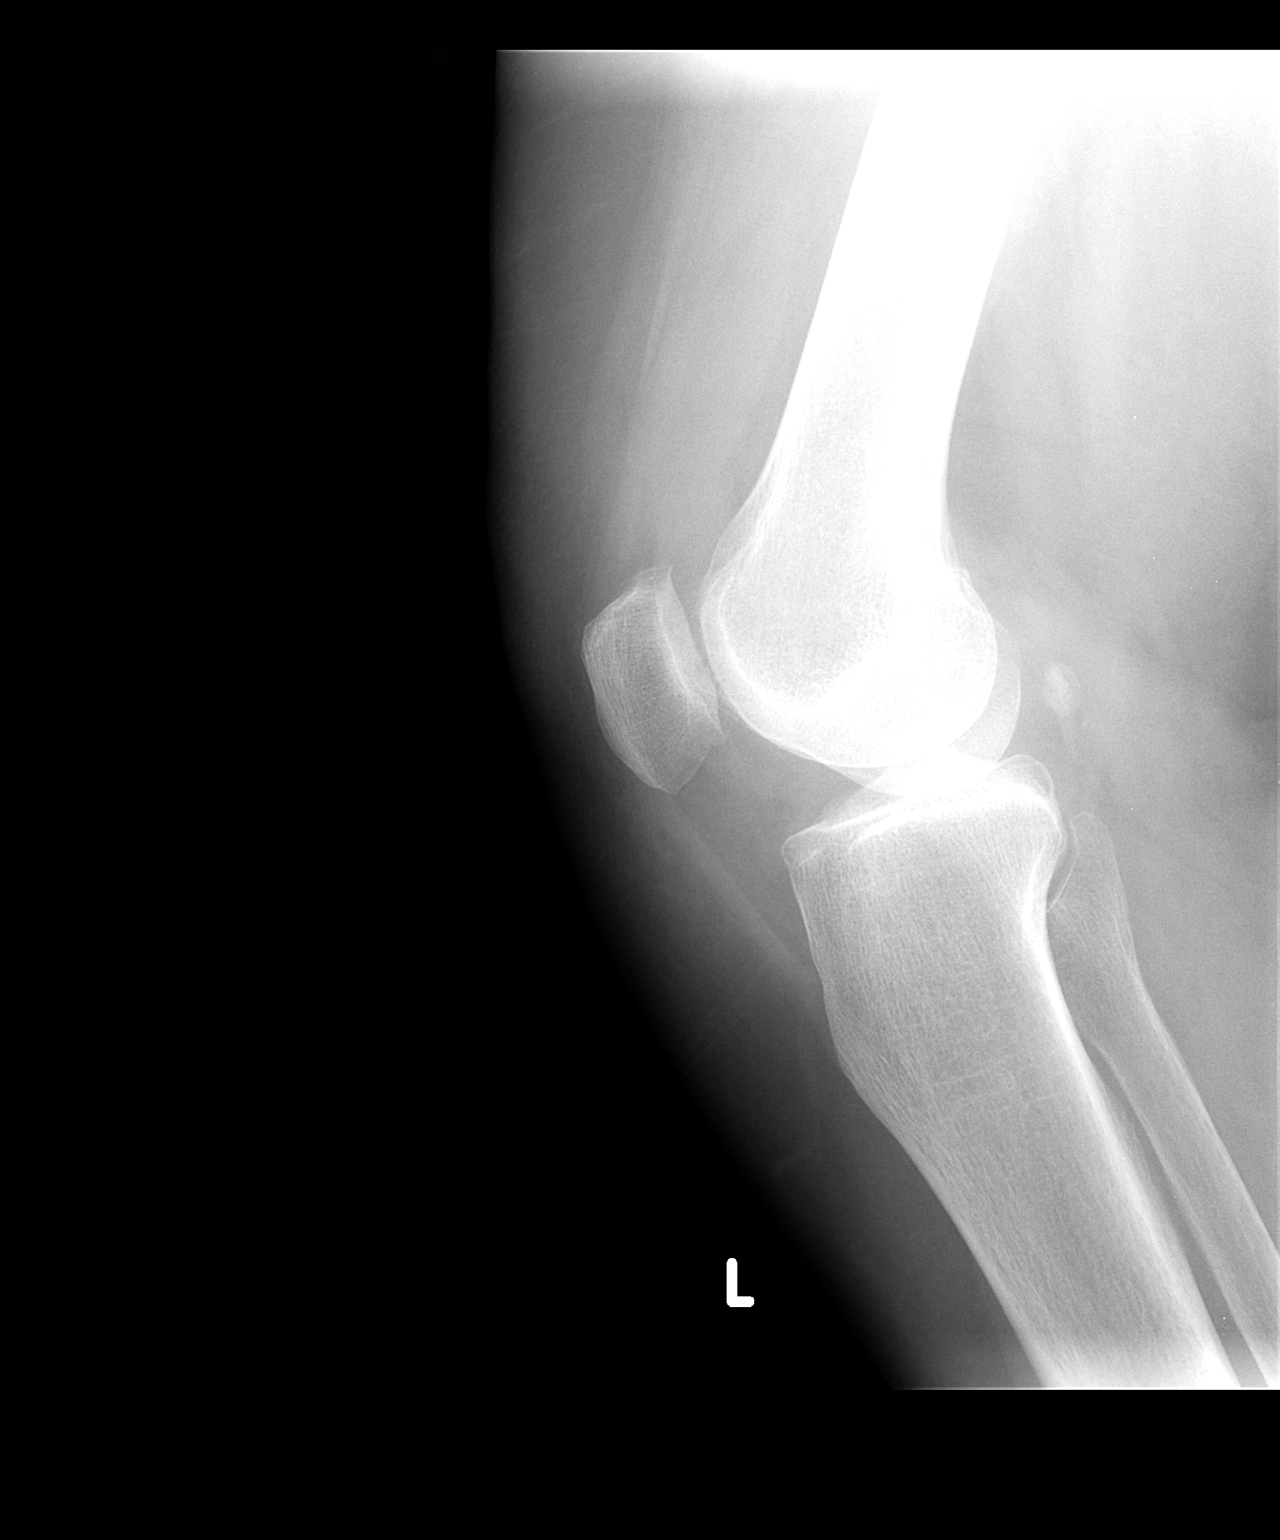

[2 of 2 positions shown; findings below may reference images not displayed]

FINDINGS: Mild tricompartmental osteoarthritis with bony spurring. Slight
joint space loss. Normal alignment without fracture. Small effusion
suspected on the lateral view.
IMPRESSION: Mild right knee tricompartmental osteoarthritis.

Small effusion

No acute osseous finding.

## 2017-05-31 DIAGNOSIS — M25511 Pain in right shoulder: Secondary | ICD-10-CM | POA: Diagnosis not present

## 2017-06-22 NOTE — Patient Instructions (Addendum)
Make an appointment with your GYN.    Test(s) ordered today. Your results will be released to [place] (or called to you) after review, usually within 72hours after test completion. If any changes need to be made, you will be notified at that same time.  All other Health Maintenance issues reviewed.   All recommended immunizations and age-appropriate screenings are up-to-date or discussed.  No immunizations administered today. Consider the shingles vaccine.   Medications reviewed and updated.   No changes recommended at this time.   Please followup in one year   Health Maintenance, Female Adopting a healthy lifestyle and getting preventive care can go a long way to promote health and wellness. Talk with your health care provider about what schedule of regular examinations is right for you. This is a good chance for you to check in with your provider about disease prevention and staying healthy. In between checkups, there are plenty of things you can do on your own. Experts have done a lot of research about which lifestyle changes and preventive measures are most likely to keep you healthy. Ask your health care provider for more information. Weight and diet Eat a healthy diet  Be sure to include plenty of vegetables, fruits, low-fat dairy products, and lean protein.  Do not eat a lot of foods high in solid fats, added sugars, or salt.  Get regular exercise. This is one of the most important things you can do for your health. ? Most adults should exercise for at least 150 minutes each week. The exercise should increase your heart rate and make you sweat (moderate-intensity exercise). ? Most adults should also do strengthening exercises at least twice a week. This is in addition to the moderate-intensity exercise.  Maintain a healthy weight  Body mass index (BMI) is a measurement that can be used to identify possible weight problems. It estimates body fat based on height and weight. Your  health care provider can help determine your BMI and help you achieve or maintain a healthy weight.  For females 31 years of age and older: ? A BMI below 18.5 is considered underweight. ? A BMI of 18.5 to 24.9 is normal. ? A BMI of 25 to 29.9 is considered overweight. ? A BMI of 30 and above is considered obese.  Watch levels of cholesterol and blood lipids  You should start having your blood tested for lipids and cholesterol at 65 years of age, then have this test every 5 years.  You may need to have your cholesterol levels checked more often if: ? Your lipid or cholesterol levels are high. ? You are older than 65 years of age. ? You are at high risk for heart disease.  Cancer screening Lung Cancer  Lung cancer screening is recommended for adults 71-21 years old who are at high risk for lung cancer because of a history of smoking.  A yearly low-dose CT scan of the lungs is recommended for people who: ? Currently smoke. ? Have quit within the past 15 years. ? Have at least a 30-pack-year history of smoking. A pack year is smoking an average of one pack of cigarettes a day for 1 year.  Yearly screening should continue until it has been 15 years since you quit.  Yearly screening should stop if you develop a health problem that would prevent you from having lung cancer treatment.  Breast Cancer  Practice breast self-awareness. This means understanding how your breasts normally appear and feel.  It also  means doing regular breast self-exams. Let your health care provider know about any changes, no matter how small.  If you are in your 20s or 30s, you should have a clinical breast exam (CBE) by a health care provider every 1-3 years as part of a regular health exam.  If you are 49 or older, have a CBE every year. Also consider having a breast X-ray (mammogram) every year.  If you have a family history of breast cancer, talk to your health care provider about genetic  screening.  If you are at high risk for breast cancer, talk to your health care provider about having an MRI and a mammogram every year.  Breast cancer gene (BRCA) assessment is recommended for women who have family members with BRCA-related cancers. BRCA-related cancers include: ? Breast. ? Ovarian. ? Tubal. ? Peritoneal cancers.  Results of the assessment will determine the need for genetic counseling and BRCA1 and BRCA2 testing.  Cervical Cancer Your health care provider may recommend that you be screened regularly for cancer of the pelvic organs (ovaries, uterus, and vagina). This screening involves a pelvic examination, including checking for microscopic changes to the surface of your cervix (Pap test). You may be encouraged to have this screening done every 3 years, beginning at age 52.  For women ages 64-65, health care providers may recommend pelvic exams and Pap testing every 3 years, or they may recommend the Pap and pelvic exam, combined with testing for human papilloma virus (HPV), every 5 years. Some types of HPV increase your risk of cervical cancer. Testing for HPV may also be done on women of any age with unclear Pap test results.  Other health care providers may not recommend any screening for nonpregnant women who are considered low risk for pelvic cancer and who do not have symptoms. Ask your health care provider if a screening pelvic exam is right for you.  If you have had past treatment for cervical cancer or a condition that could lead to cancer, you need Pap tests and screening for cancer for at least 20 years after your treatment. If Pap tests have been discontinued, your risk factors (such as having a new sexual partner) need to be reassessed to determine if screening should resume. Some women have medical problems that increase the chance of getting cervical cancer. In these cases, your health care provider may recommend more frequent screening and Pap  tests.  Colorectal Cancer  This type of cancer can be detected and often prevented.  Routine colorectal cancer screening usually begins at 65 years of age and continues through 65 years of age.  Your health care provider may recommend screening at an earlier age if you have risk factors for colon cancer.  Your health care provider may also recommend using home test kits to check for hidden blood in the stool.  A small camera at the end of a tube can be used to examine your colon directly (sigmoidoscopy or colonoscopy). This is done to check for the earliest forms of colorectal cancer.  Routine screening usually begins at age 63.  Direct examination of the colon should be repeated every 5-10 years through 65 years of age. However, you may need to be screened more often if early forms of precancerous polyps or small growths are found.  Skin Cancer  Check your skin from head to toe regularly.  Tell your health care provider about any new moles or changes in moles, especially if there is a change in  a mole's shape or color.  Also tell your health care provider if you have a mole that is larger than the size of a pencil eraser.  Always use sunscreen. Apply sunscreen liberally and repeatedly throughout the day.  Protect yourself by wearing long sleeves, pants, a wide-brimmed hat, and sunglasses whenever you are outside.  Heart disease, diabetes, and high blood pressure  High blood pressure causes heart disease and increases the risk of stroke. High blood pressure is more likely to develop in: ? People who have blood pressure in the high end of the normal range (130-139/85-89 mm Hg). ? People who are overweight or obese. ? People who are African American.  If you are 23-30 years of age, have your blood pressure checked every 3-5 years. If you are 30 years of age or older, have your blood pressure checked every year. You should have your blood pressure measured twice-once when you are at  a hospital or clinic, and once when you are not at a hospital or clinic. Record the average of the two measurements. To check your blood pressure when you are not at a hospital or clinic, you can use: ? An automated blood pressure machine at a pharmacy. ? A home blood pressure monitor.  If you are between 24 years and 59 years old, ask your health care provider if you should take aspirin to prevent strokes.  Have regular diabetes screenings. This involves taking a blood sample to check your fasting blood sugar level. ? If you are at a normal weight and have a low risk for diabetes, have this test once every three years after 65 years of age. ? If you are overweight and have a high risk for diabetes, consider being tested at a younger age or more often. Preventing infection Hepatitis B  If you have a higher risk for hepatitis B, you should be screened for this virus. You are considered at high risk for hepatitis B if: ? You were born in a country where hepatitis B is common. Ask your health care provider which countries are considered high risk. ? Your parents were born in a high-risk country, and you have not been immunized against hepatitis B (hepatitis B vaccine). ? You have HIV or AIDS. ? You use needles to inject street drugs. ? You live with someone who has hepatitis B. ? You have had sex with someone who has hepatitis B. ? You get hemodialysis treatment. ? You take certain medicines for conditions, including cancer, organ transplantation, and autoimmune conditions.  Hepatitis C  Blood testing is recommended for: ? Everyone born from [birth year] through 1963. ? Anyone with known risk factors for hepatitis C.  Sexually transmitted infections (STIs)  You should be screened for sexually transmitted infections (STIs) including gonorrhea and chlamydia if: ? You are sexually active and are younger than 65 years of age. ? You are older than 65 years of age and your health care provider tells  you that you are at risk for this type of infection. ? Your sexual activity has changed since you were last screened and you are at an increased risk for chlamydia or gonorrhea. Ask your health care provider if you are at risk.  If you do not have HIV, but are at risk, it may be recommended that you take a prescription medicine daily to prevent HIV infection. This is called pre-exposure prophylaxis (PrEP). You are considered at risk if: ? You are sexually active and do not regularly use condoms  or know the HIV status of your partner(s). ? You take drugs by injection. ? You are sexually active with a partner who has HIV.  Talk with your health care provider about whether you are at high risk of being infected with HIV. If you choose to begin PrEP, you should first be tested for HIV. You should then be tested every 3 months for as long as you are taking PrEP. Pregnancy  If you are premenopausal and you may become pregnant, ask your health care provider about preconception counseling.  If you may become pregnant, take 400 to 800 micrograms (mcg) of folic acid every day.  If you want to prevent pregnancy, talk to your health care provider about birth control (contraception). Osteoporosis and menopause  Osteoporosis is a disease in which the bones lose minerals and strength with aging. This can result in serious bone fractures. Your risk for osteoporosis can be identified using a bone density scan.  If you are 85 years of age or older, or if you are at risk for osteoporosis and fractures, ask your health care provider if you should be screened.  Ask your health care provider whether you should take a calcium or vitamin D supplement to lower your risk for osteoporosis.  Menopause may have certain physical symptoms and risks.  Hormone replacement therapy may reduce some of these symptoms and risks. Talk to your health care provider about whether hormone replacement therapy is right for  you. Follow these instructions at home:  Schedule regular health, dental, and eye exams.  Stay current with your immunizations.  Do not use any tobacco products including cigarettes, chewing tobacco, or electronic cigarettes.  If you are pregnant, do not drink alcohol.  If you are breastfeeding, limit how much and how often you drink alcohol.  Limit alcohol intake to no more than 1 drink per day for nonpregnant women. One drink equals 12 ounces of beer, 5 ounces of wine, or 1 ounces of hard liquor.  Do not use street drugs.  Do not share needles.  Ask your health care provider for help if you need support or information about quitting drugs.  Tell your health care provider if you often feel depressed.  Tell your health care provider if you have ever been abused or do not feel safe at home. This information is not intended to replace advice given to you by your health care provider. Make sure you discuss any questions you have with your health care provider. Document Released: 07/12/2010 Document Revised: 06/04/2015 Document Reviewed: 09/30/2014 Elsevier Interactive Patient Education  [employer].
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
## 2017-06-23 ENCOUNTER — Ambulatory Visit (INDEPENDENT_AMBULATORY_CARE_PROVIDER_SITE_OTHER): Payer: PPO | Admitting: Internal Medicine

## 2017-06-23 ENCOUNTER — Other Ambulatory Visit (INDEPENDENT_AMBULATORY_CARE_PROVIDER_SITE_OTHER): Payer: PPO

## 2017-06-23 ENCOUNTER — Encounter: Payer: Self-pay | Admitting: Internal Medicine

## 2017-06-23 VITALS — BP 130/88 | HR 84 | Temp 98.4°F | Resp 16 | Ht 61.0 in | Wt 177.0 lb

## 2017-06-23 DIAGNOSIS — E785 Hyperlipidemia, unspecified: Secondary | ICD-10-CM | POA: Diagnosis not present

## 2017-06-23 DIAGNOSIS — Z0000 Encounter for general adult medical examination without abnormal findings: Secondary | ICD-10-CM

## 2017-06-23 DIAGNOSIS — R739 Hyperglycemia, unspecified: Secondary | ICD-10-CM

## 2017-06-23 DIAGNOSIS — I10 Essential (primary) hypertension: Secondary | ICD-10-CM | POA: Diagnosis not present

## 2017-06-23 LAB — COMPREHENSIVE METABOLIC PANEL
ALT: 12 U/L (ref 0–35)
AST: 10 U/L (ref 0–37)
Albumin: 4.3 g/dL (ref 3.5–5.2)
Alkaline Phosphatase: 85 U/L (ref 39–117)
BILIRUBIN TOTAL: 0.3 mg/dL (ref 0.2–1.2)
BUN: 11 mg/dL (ref 6–23)
CALCIUM: 10.5 mg/dL (ref 8.4–10.5)
CHLORIDE: 98 meq/L (ref 96–112)
CO2: 31 meq/L (ref 19–32)
Creatinine, Ser: 0.64 mg/dL (ref 0.40–1.20)
GFR: 99.11 mL/min (ref >60.00)
GLUCOSE: 101 mg/dL — AB (ref 70–99)
POTASSIUM: 4.1 meq/L (ref 3.5–5.1)
Sodium: 136 mEq/L (ref 135–145)
Total Protein: 7.7 g/dL (ref 6.0–8.3)

## 2017-06-23 LAB — CBC WITH DIFFERENTIAL/PLATELET
BASOS ABS: 0.1 10*3/uL (ref 0.0–0.1)
BASOS PCT: 1.5 % (ref 0.0–3.0)
EOS PCT: 1.3 % (ref 0.0–5.0)
Eosinophils Absolute: 0.1 10*3/uL (ref 0.0–0.7)
HEMATOCRIT: 41.7 % (ref 36.0–46.0)
Hemoglobin: 14.5 g/dL (ref 12.0–15.0)
LYMPHS PCT: 38.8 % (ref 12.0–46.0)
Lymphs Abs: 2.9 10*3/uL (ref 0.7–4.0)
MCHC: 34.6 g/dL (ref 30.0–36.0)
MCV: 90.2 fl (ref 78.0–100.0)
MONOS PCT: 7.6 % (ref 3.0–12.0)
Monocytes Absolute: 0.6 10*3/uL (ref 0.1–1.0)
NEUTROS ABS: 3.9 10*3/uL (ref 1.4–7.7)
Neutrophils Relative %: 50.8 % (ref 43.0–77.0)
PLATELETS: 414 10*3/uL — AB (ref 150.0–400.0)
RBC: 4.62 Mil/uL (ref 3.87–5.11)
RDW: 13.5 % (ref 11.5–15.5)
WBC: 7.6 10*3/uL (ref 4.0–10.5)

## 2017-06-23 LAB — LIPID PANEL
CHOL/HDL RATIO: 5
CHOLESTEROL: 241 mg/dL — AB (ref 0–200)
HDL: 48.2 mg/dL (ref >39.00)
NONHDL: 192.83
TRIGLYCERIDES: 226 mg/dL — AB (ref 0.0–149.0)
VLDL: 45.2 mg/dL — AB (ref 0.0–40.0)

## 2017-06-23 LAB — TSH: TSH: 1.42 u[IU]/mL (ref 0.35–4.50)

## 2017-06-23 LAB — LDL CHOLESTEROL, DIRECT: Direct LDL: 154 mg/dL

## 2017-06-23 LAB — HEMOGLOBIN A1C: Hgb A1c MFr Bld: 5.4 % (ref 4.6–6.5)

## 2017-06-24 ENCOUNTER — Encounter: Payer: Self-pay | Admitting: Internal Medicine

## 2017-06-24 NOTE — Assessment & Plan Note (Signed)
History of hyperglycemia Sedentary lifestyle and overweight At risk for prediabetes/diabetes Check A1c Encouraged regular exercise and healthy diet

## 2017-06-24 NOTE — Assessment & Plan Note (Signed)
Diastolic borderline high, but overall blood pressure appears to be controlled Continue current medication at current dose CMP

## 2017-06-24 NOTE — Assessment & Plan Note (Signed)
Not currently on medication Check lipid panel, CMP, TSH Encouraged increasing her activity and working on weight loss Heart healthy diet
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
## 2017-09-01 ENCOUNTER — Ambulatory Visit (INDEPENDENT_AMBULATORY_CARE_PROVIDER_SITE_OTHER): Payer: PPO | Admitting: Internal Medicine

## 2017-09-01 ENCOUNTER — Encounter: Payer: Self-pay | Admitting: Internal Medicine

## 2017-09-01 VITALS — BP 112/74 | HR 87 | Temp 98.5°F | Resp 16 | Ht 61.0 in | Wt 180.0 lb

## 2017-09-01 DIAGNOSIS — M79604 Pain in right leg: Secondary | ICD-10-CM | POA: Diagnosis not present

## 2017-09-01 MED ORDER — PREDNISONE 20 MG PO TABS: 40.0000 mg | ORAL_TABLET | Freq: Every day | ORAL | 0 refills | Status: DC

## 2017-09-01 NOTE — Assessment & Plan Note (Addendum)
Possibly radiculopathy from lower back disease Has never had lower back disease, but has cervical spine disease Pain was worse yesterday when she was more active Trial of 5 days of prednisone to see if that helps -discussed possible side effects.  Advised to take with food earlier in the morning. Can consider gabapentin If no improvement orthopedics

## 2017-09-01 NOTE — Patient Instructions (Signed)
Your leg pain may be nerve pain coming from your lower back.     Take the prednisone (steroids) as prescribed.  If your leg pain does not improve or worsens we can try gabapentin (nerve pain medication) or you can see Dr Mardelle Matte.

## 2017-09-06 ENCOUNTER — Encounter: Payer: Self-pay | Admitting: Internal Medicine

## 2017-10-16 ENCOUNTER — Encounter: Payer: Self-pay | Admitting: Obstetrics & Gynecology

## 2017-10-16 ENCOUNTER — Ambulatory Visit: Payer: PPO | Admitting: Obstetrics & Gynecology

## 2017-10-16 VITALS — BP 132/84 | Ht 60.25 in | Wt 180.0 lb

## 2017-10-16 DIAGNOSIS — Z6834 Body mass index (BMI) 34.0-34.9, adult: Secondary | ICD-10-CM

## 2017-10-16 DIAGNOSIS — Z13820 Encounter for screening for osteoporosis: Secondary | ICD-10-CM

## 2017-10-16 DIAGNOSIS — Z779 Other contact with and (suspected) exposures hazardous to health: Secondary | ICD-10-CM | POA: Diagnosis not present

## 2017-10-16 DIAGNOSIS — Z01419 Encounter for gynecological examination (general) (routine) without abnormal findings: Secondary | ICD-10-CM

## 2017-10-16 DIAGNOSIS — Z9189 Other specified personal risk factors, not elsewhere classified: Secondary | ICD-10-CM

## 2017-10-16 DIAGNOSIS — E6609 Other obesity due to excess calories: Secondary | ICD-10-CM

## 2017-10-16 DIAGNOSIS — Z780 Asymptomatic menopausal state: Secondary | ICD-10-CM

## 2017-10-16 NOTE — Progress Notes (Signed)
Colleen Walsh 05/21/1950 [id number]   History:    67 y.o. G2P2L2 Married  RP:  Established patient presenting for annual gyn exam   HPI: Menopause, well on no hormone replacement therapy.  No postmenopausal bleeding.  No pelvic pain.  Abstinent.  Urine and bowel movements normal.  Breasts normal.  Body mass index 34.86.  Not very physically active currently.  Would like to lose weight.  Health labs with family physician.  Colonoscopy 5 years ago.  Past medical history,surgical history, family history and social history were all reviewed and documented in the EPIC chart.  Gynecologic History No LMP recorded. Patient is postmenopausal. Contraception: post menopausal status Last Pap: 08/2015. Results were: Negative Last mammogram: 2018. Results were: normal per patient.  Will obtain report from [facility]: 2010 Osteopenia Colonoscopy: 5 yrs ago  Obstetric History OB History  Gravida Para Term Preterm AB Living  2 0       2  SAB TAB Ectopic Multiple Live Births               # Outcome Date GA Lbr Len/2nd Weight Sex Delivery Anes PTL Lv  2 Gravida           1 Gravida              ROS: A ROS was performed and pertinent positives and negatives are included in the history.  GENERAL: No fevers or chills. HEENT: No change in vision, no earache, sore throat or sinus congestion. NECK: No pain or stiffness. CARDIOVASCULAR: No chest pain or pressure. No palpitations. PULMONARY: No shortness of breath, cough or wheeze. GASTROINTESTINAL: No abdominal pain, nausea, vomiting or diarrhea, melena or bright red blood per rectum. GENITOURINARY: No urinary frequency, urgency, hesitancy or dysuria. MUSCULOSKELETAL: No joint or muscle pain, no back pain, no recent trauma. DERMATOLOGIC: No rash, no itching, no lesions. ENDOCRINE: No polyuria, polydipsia, no heat or cold intolerance. No recent change in weight. HEMATOLOGICAL: No anemia or easy bruising or bleeding. NEUROLOGIC: No  headache, seizures, numbness, tingling or weakness. PSYCHIATRIC: No depression, no loss of interest in normal activity or change in sleep pattern.     Exam:   BP 132/84   Ht 5' 0.25" (1.53 m)   Wt 180 lb (81.6 kg)   BMI 34.86 kg/m   Body mass index is 34.86 kg/m.  General appearance : Well developed well nourished female. No acute distress HEENT: Eyes: no retinal hemorrhage or exudates,  Neck supple, trachea midline, no carotid bruits, no thyroidmegaly Lungs: Clear to auscultation, no rhonchi or wheezes, or rib retractions  Heart: Regular rate and rhythm, no murmurs or gallops Breast:Examined in sitting and supine position were symmetrical in appearance, no palpable masses or tenderness,  no skin retraction, no nipple inversion, no nipple discharge, no skin discoloration, no axillary or supraclavicular lymphadenopathy Abdomen: no palpable masses or tenderness, no rebound or guarding Extremities: no edema or skin discoloration or tenderness  Pelvic: Vulva: Normal             Vagina: No gross lesions or discharge  Cervix: No gross lesions or discharge  Uterus  AV, normal size, shape and consistency, non-tender and mobile  Adnexa  Without masses or tenderness  Anus: Normal   Assessment/Plan:  67 y.o. female for annual exam   1. Well female exam with routine gynecological exam Normal gynecologic exam in menopause.  Pap test negative in August 2017.  No indication to repeat this year.  Breast exam  normal.  Will obtain last mammogram done at [facility] in 2018 and patient will schedule this years for screening mammogram over there.  Colonoscopy 5 years ago.  Health labs with family physician.  2. Postmenopausal Well on no hormone replacement therapy.  No postmenopausal bleeding.  3. Screening for osteoporosis Schedule bone density here now.  Recommend vitamin D supplements, calcium intake of 1.5 g/day and regular weightbearing physical activity. - [facility] Density; Future  4. Class  1 obesity due to excess calories without serious comorbidity with body mass index (BMI) of 34.0 to 34.9 in adult Recommend low calorie/low carb diet such as [employer].  Recommend aerobic physical activity 5 times a week and weightlifting every 2 days.  [name] MD, 2:21 PM 10/16/2017

## 2017-10-16 NOTE — Patient Instructions (Signed)
1. Well female exam with routine gynecological exam Normal gynecologic exam in menopause.  Pap test negative in August 2017.  No indication to repeat this year.  Breast exam normal.  Will obtain last mammogram done at [facility] in 2018 and patient will schedule this years for screening mammogram over there.  Colonoscopy 5 years ago.  Health labs with family physician.  2. Postmenopausal Well on no hormone replacement therapy.  No postmenopausal bleeding.  3. Screening for osteoporosis Schedule bone density here now.  Recommend vitamin D supplements, calcium intake of 1.5 g/day and regular weightbearing physical activity. - DG Bone Density; Future  4. Class 1 obesity due to excess calories without serious comorbidity with body mass index (BMI) of 34.0 to 34.9 in adult Recommend low calorie/low carb diet such as [employer].  Recommend aerobic physical activity 5 times a week and weightlifting every 2 days.  Colleen Walsh, it was a pleasure seeing you today!

## 2017-10-17 ENCOUNTER — Telehealth: Payer: Self-pay | Admitting: *Deleted

## 2017-10-17 NOTE — Telephone Encounter (Signed)
Patient had annual exam yesterday and states you told her to schedule dexa, last dexa in 08/2015 ( in epic) she asked if you would review 2017 dexa and confirm you want her to have dexa this year? States you told her at annual last dexa was in 2010. I did explain to her typically bone density are repeated every 2 year, but I will double check with you. Please advise

## 2017-10-18 ENCOUNTER — Telehealth: Payer: Self-pay | Admitting: Gastroenterology

## 2017-10-18 NOTE — Telephone Encounter (Signed)
Called patient back and left detailed voice message to stop taking the Zantac if she is taking it and switch to Pepcid. Question of Zantac being on her allergy list.

## 2017-10-18 NOTE — Telephone Encounter (Signed)
Pt heard the news about ranitidine and zantac and wants to know if she should switch to a different medication. Pls call her.

## 2017-10-19 NOTE — Telephone Encounter (Signed)
Left message on voicemail to keep scheduled dexa

## 2017-10-19 NOTE — Telephone Encounter (Signed)
Patient called back has been informed.

## 2017-10-19 NOTE — Telephone Encounter (Signed)
Yes, 2017 BD Osteopenia with T-Score -2.4 (close to Osteoporosis).  Needs to schedule repeat BD now.

## 2017-10-24 ENCOUNTER — Other Ambulatory Visit: Payer: Self-pay | Admitting: Obstetrics & Gynecology

## 2017-10-24 ENCOUNTER — Telehealth: Payer: Self-pay

## 2017-10-24 DIAGNOSIS — Z1231 Encounter for screening mammogram for malignant neoplasm of breast: Secondary | ICD-10-CM

## 2017-10-24 NOTE — Telephone Encounter (Signed)
Patient called inquiring when her last mammo was. I told her on file was 2016.  She said she had it done last year at [facility]. I provided phone number and told her she will need to call them to see date of last one.

## 2017-10-29 ENCOUNTER — Other Ambulatory Visit: Payer: Self-pay | Admitting: Internal Medicine

## 2017-10-30 ENCOUNTER — Other Ambulatory Visit: Payer: Self-pay | Admitting: Obstetrics & Gynecology

## 2017-10-30 ENCOUNTER — Ambulatory Visit (INDEPENDENT_AMBULATORY_CARE_PROVIDER_SITE_OTHER): Payer: PPO

## 2017-10-30 DIAGNOSIS — Z13820 Encounter for screening for osteoporosis: Secondary | ICD-10-CM

## 2017-10-30 DIAGNOSIS — M8589 Other specified disorders of bone density and structure, multiple sites: Secondary | ICD-10-CM

## 2017-10-30 MED ORDER — CYCLOBENZAPRINE HCL 10 MG PO TABS: 10.0000 mg | ORAL_TABLET | Freq: Two times a day (BID) | ORAL | 0 refills | Status: DC | PRN

## 2017-11-06 ENCOUNTER — Other Ambulatory Visit: Payer: Self-pay | Admitting: Internal Medicine

## 2017-11-09 ENCOUNTER — Telehealth: Payer: Self-pay

## 2017-11-09 NOTE — Telephone Encounter (Signed)
Copied from [place] [id number]. Topic: General - Other >> Nov 07, 2017 11:16 AM Colleen Walsh A wrote: Reason for CRM: pt called in, she says when she went to go fill her RX for the medication amLODipine yesterday the pharmacist advised her that she did not have any refills left. I advised pt that the medication has been filled and is ready for pick up. The pt just wants to make sure she will have no issues going forward with filling this medication.   Please advise

## 2017-11-09 NOTE — Telephone Encounter (Signed)
LVM for pt to call back in regards if needed.

## 2017-11-28 ENCOUNTER — Ambulatory Visit
Admission: RE | Admit: 2017-11-28 | Discharge: 2017-11-28 | Disposition: A | Discharge status: Home / Self Care | Payer: PPO | Source: Ambulatory Visit | Attending: Obstetrics & Gynecology | Admitting: Obstetrics & Gynecology

## 2017-11-28 DIAGNOSIS — Z1231 Encounter for screening mammogram for malignant neoplasm of breast: Secondary | ICD-10-CM

## 2018-02-06 DIAGNOSIS — R635 Abnormal weight gain: Secondary | ICD-10-CM | POA: Diagnosis not present

## 2018-02-06 DIAGNOSIS — E7800 Pure hypercholesterolemia, unspecified: Secondary | ICD-10-CM | POA: Diagnosis not present

## 2018-02-06 DIAGNOSIS — N951 Menopausal and female climacteric states: Secondary | ICD-10-CM | POA: Diagnosis not present

## 2018-02-08 DIAGNOSIS — Z72820 Sleep deprivation: Secondary | ICD-10-CM | POA: Diagnosis not present

## 2018-02-08 DIAGNOSIS — R4586 Emotional lability: Secondary | ICD-10-CM | POA: Diagnosis not present

## 2018-02-08 DIAGNOSIS — Z1331 Encounter for screening for depression: Secondary | ICD-10-CM | POA: Diagnosis not present

## 2018-02-08 DIAGNOSIS — I10 Essential (primary) hypertension: Secondary | ICD-10-CM | POA: Diagnosis not present

## 2018-02-08 DIAGNOSIS — E7800 Pure hypercholesterolemia, unspecified: Secondary | ICD-10-CM | POA: Diagnosis not present

## 2018-02-08 DIAGNOSIS — Z6834 Body mass index (BMI) 34.0-34.9, adult: Secondary | ICD-10-CM | POA: Diagnosis not present

## 2018-02-08 DIAGNOSIS — N951 Menopausal and female climacteric states: Secondary | ICD-10-CM | POA: Diagnosis not present

## 2018-02-08 DIAGNOSIS — H04123 Dry eye syndrome of bilateral lacrimal glands: Secondary | ICD-10-CM | POA: Diagnosis not present

## 2018-02-08 DIAGNOSIS — N898 Other specified noninflammatory disorders of vagina: Secondary | ICD-10-CM | POA: Diagnosis not present

## 2018-02-08 DIAGNOSIS — H02833 Dermatochalasis of right eye, unspecified eyelid: Secondary | ICD-10-CM | POA: Diagnosis not present

## 2018-02-08 DIAGNOSIS — H01003 Unspecified blepharitis right eye, unspecified eyelid: Secondary | ICD-10-CM | POA: Diagnosis not present

## 2018-02-08 DIAGNOSIS — Z1339 Encounter for screening examination for other mental health and behavioral disorders: Secondary | ICD-10-CM | POA: Diagnosis not present

## 2018-02-08 DIAGNOSIS — M3501 Sicca syndrome with keratoconjunctivitis: Secondary | ICD-10-CM | POA: Diagnosis not present

## 2018-02-15 DIAGNOSIS — I10 Essential (primary) hypertension: Secondary | ICD-10-CM | POA: Diagnosis not present

## 2018-02-15 DIAGNOSIS — Z6834 Body mass index (BMI) 34.0-34.9, adult: Secondary | ICD-10-CM | POA: Diagnosis not present

## 2018-02-23 ENCOUNTER — Ambulatory Visit (INDEPENDENT_AMBULATORY_CARE_PROVIDER_SITE_OTHER): Payer: PPO | Admitting: Internal Medicine

## 2018-02-23 ENCOUNTER — Encounter: Payer: Self-pay | Admitting: Internal Medicine

## 2018-02-23 DIAGNOSIS — J029 Acute pharyngitis, unspecified: Secondary | ICD-10-CM | POA: Diagnosis not present

## 2018-02-23 LAB — POCT RAPID STREP A (OFFICE): Rapid Strep A Screen: NEGATIVE

## 2018-02-23 MED ORDER — LIDOCAINE VISCOUS HCL 2 % MT SOLN: 15.0000 mL | OROMUCOSAL | 0 refills | Status: DC | PRN

## 2018-02-23 NOTE — Patient Instructions (Signed)
The strep test is negative and this is likely a virus. Most viruses can last an average of 7-10 days and usually the time when you are feeling the worst is day 4-5.   Start taking zyrtec (cetirizine) over the counter 1 pill daily for the next 1 week or so.   We have sent in the lidocaine medicine which you can use for pain in the throat to swish and swallow.

## 2018-02-23 NOTE — Progress Notes (Signed)
   Subjective:   Patient ID: Colleen Walsh, female    DOB: 12-Apr-1950, 68 y.o.   MRN: [id number]  HPI The patient is a 68 y.o. female coming in for cold symptoms. Started Monday. Main symptoms are: sore throat and left ear numbness or pain. Denies fevers or chills. Denies hearing changes. Denies cough or SOB. Denies chest pains. Overall it is worsening. Has tried nyquil and warm salty water which did not help.   Review of Systems  Constitutional: Positive for activity change and appetite change. Negative for chills, fatigue, fever and unexpected weight change.  HENT: Positive for congestion and postnasal drip. Negative for ear discharge, ear pain, rhinorrhea, sinus pressure, sinus pain, sneezing, sore throat, tinnitus, trouble swallowing and voice change.   Eyes: Negative.   Respiratory: Negative for cough, chest tightness, shortness of breath and wheezing.   Cardiovascular: Negative.   Gastrointestinal: Negative.   Musculoskeletal: Negative for myalgias.  Neurological: Negative.     Objective:  Physical Exam Constitutional:      Appearance: She is well-developed.  HENT:     Head: Normocephalic and atraumatic.     Comments: Oropharynx with redness and clear drainage, nose with swollen turbinates, TMs normal bilaterally.  Neck:     Musculoskeletal: Normal range of motion.     Thyroid: No thyromegaly.  Cardiovascular:     Rate and Rhythm: Normal rate and regular rhythm.  Pulmonary:     Effort: Pulmonary effort is normal. No respiratory distress.     Breath sounds: Normal breath sounds. No wheezing or rales.  Abdominal:     Palpations: Abdomen is soft.  Musculoskeletal:        General: Tenderness present.  Lymphadenopathy:     Cervical: No cervical adenopathy.  Skin:    General: Skin is warm and dry.  Neurological:     Mental Status: She is alert and oriented to person, place, and time.     Vitals:   02/23/18 1439  BP: 138/82  Pulse: 95  Temp: 98 F (36.7 C)    TempSrc: Oral  SpO2: 97%  Weight: 182 lb (82.6 kg)  Height: 5' 0.25" (1.53 m)    Assessment & Plan:

## 2018-02-23 NOTE — Assessment & Plan Note (Signed)
Likely viral, strep test done in office to rule out strep which was negative. Rx for lidocaine viscous and advised to start zyrtec and flonase.

## 2018-02-26 ENCOUNTER — Emergency Department (HOSPITAL_COMMUNITY)
Admission: EM | Admit: 2018-02-26 | Discharge: 2018-02-26 | Disposition: A | Discharge status: Home / Self Care | Payer: PPO | Attending: Emergency Medicine | Admitting: Emergency Medicine

## 2018-02-26 ENCOUNTER — Encounter (HOSPITAL_COMMUNITY): Payer: Self-pay | Admitting: Emergency Medicine

## 2018-02-26 ENCOUNTER — Emergency Department (HOSPITAL_COMMUNITY): Payer: PPO

## 2018-02-26 ENCOUNTER — Other Ambulatory Visit: Payer: Self-pay

## 2018-02-26 DIAGNOSIS — J028 Acute pharyngitis due to other specified organisms: Secondary | ICD-10-CM | POA: Diagnosis not present

## 2018-02-26 DIAGNOSIS — J029 Acute pharyngitis, unspecified: Secondary | ICD-10-CM | POA: Insufficient documentation

## 2018-02-26 DIAGNOSIS — H60502 Unspecified acute noninfective otitis externa, left ear: Secondary | ICD-10-CM | POA: Diagnosis not present

## 2018-02-26 DIAGNOSIS — R600 Localized edema: Secondary | ICD-10-CM | POA: Diagnosis present

## 2018-02-26 DIAGNOSIS — H9202 Otalgia, left ear: Secondary | ICD-10-CM | POA: Diagnosis not present

## 2018-02-26 DIAGNOSIS — Z79899 Other long term (current) drug therapy: Secondary | ICD-10-CM | POA: Insufficient documentation

## 2018-02-26 DIAGNOSIS — I10 Essential (primary) hypertension: Secondary | ICD-10-CM | POA: Insufficient documentation

## 2018-02-26 DIAGNOSIS — B9689 Other specified bacterial agents as the cause of diseases classified elsewhere: Secondary | ICD-10-CM | POA: Diagnosis not present

## 2018-02-26 LAB — BASIC METABOLIC PANEL
Anion gap: 9 (ref 5–15)
BUN: 8 mg/dL (ref 8–23)
CHLORIDE: 103 mmol/L (ref 98–111)
CO2: 24 mmol/L (ref 22–32)
Calcium: 9 mg/dL (ref 8.9–10.3)
Creatinine, Ser: 0.69 mg/dL (ref 0.44–1.00)
GFR calc Af Amer: >60 mL/min (ref >60)
GFR calc non Af Amer: >60 mL/min (ref >60)
Glucose, Bld: 99 mg/dL (ref 70–99)
Potassium: 3.8 mmol/L (ref 3.5–5.1)
Sodium: 136 mmol/L (ref 135–145)

## 2018-02-26 LAB — CBC WITH DIFFERENTIAL/PLATELET
Abs Immature Granulocytes: 0.01 10*3/uL (ref 0.00–0.07)
Basophils Absolute: 0.1 10*3/uL (ref 0.0–0.1)
Basophils Relative: 1 %
Eosinophils Absolute: 0.1 10*3/uL (ref 0.0–0.5)
Eosinophils Relative: 1 %
HCT: 42.5 % (ref 36.0–46.0)
Hemoglobin: 13.7 g/dL (ref 12.0–15.0)
IMMATURE GRANULOCYTES: 0 %
Lymphocytes Relative: 28 %
Lymphs Abs: 1.3 10*3/uL (ref 0.7–4.0)
MCH: 29.5 pg (ref 26.0–34.0)
MCHC: 32.2 g/dL (ref 30.0–36.0)
MCV: 91.4 fL (ref 80.0–100.0)
Monocytes Absolute: 0.6 10*3/uL (ref 0.1–1.0)
Monocytes Relative: 12 %
Neutro Abs: 2.6 10*3/uL (ref 1.7–7.7)
Neutrophils Relative %: 58 %
Platelets: 325 10*3/uL (ref 150–400)
RBC: 4.65 MIL/uL (ref 3.87–5.11)
RDW: 12.9 % (ref 11.5–15.5)
WBC: 4.6 10*3/uL (ref 4.0–10.5)
nRBC: 0 % (ref 0.0–0.2)

## 2018-02-26 LAB — I-STAT CREATININE, ED: Creatinine, Ser: 0.6 mg/dL (ref 0.44–1.00)

## 2018-02-26 MED ORDER — IOHEXOL 300 MG/ML  SOLN
75.0000 mL | Freq: Once | INTRAMUSCULAR | Status: AC | PRN
  Administered 2018-02-26: 75 mL via INTRAVENOUS

## 2018-02-26 MED ORDER — SODIUM CHLORIDE (PF) 0.9 % IJ SOLN
INTRAMUSCULAR | Status: AC
  Filled 2018-02-26: qty 50

## 2018-02-26 MED ORDER — SODIUM CHLORIDE 0.9 % IV BOLUS
1000.0000 mL | Freq: Once | INTRAVENOUS | Status: AC
  Administered 2018-02-26: 1000 mL via INTRAVENOUS

## 2018-02-26 MED ORDER — AMOXICILLIN-POT CLAVULANATE 875-125 MG PO TABS: 1.0000 | ORAL_TABLET | Freq: Two times a day (BID) | ORAL | 0 refills | Status: DC

## 2018-02-26 MED ORDER — CIPROFLOXACIN-DEXAMETHASONE 0.3-0.1 % OT SUSP: 4.0000 [drp] | Freq: Two times a day (BID) | OTIC | 0 refills | Status: DC

## 2018-02-26 NOTE — Discharge Instructions (Signed)
Follow up with ENT.  Follow up with your PCP.  Return for rapid spreading redness, fever, inability to eat or drink

## 2018-02-26 NOTE — ED Notes (Addendum)
CT called regarding delay and to include  the left ear in the scan.

## 2018-02-26 NOTE — ED Provider Notes (Addendum)
[facility] Provider Note   CSN: [id number] Arrival date & time: 02/26/18  7096     History   Chief Complaint Chief Complaint  Patient presents with  . Abscess    behind left ear    HPI Colleen Walsh is a 68 y.o. female.  68 yo F with a chief complaint of left-sided ear pain.  She noted that she has a mass to the left side of her face.  Denies fevers or chills.  Has been subjectively hot and cold.  Complaining of sore throat and left ear pain.  Denies dental pain.  Has been going for the past couple days.  Slowly worsening.  History of basal cell cancer that was removed, no other history of cancer.  The history is provided by the patient.  Abscess  Associated symptoms: no fever, no headaches, no nausea and no vomiting   Illness  Severity:  Moderate Onset quality:  Gradual Duration:  2 days Timing:  Constant Progression:  Worsening Chronicity:  New Associated symptoms: ear pain and sore throat   Associated symptoms: no chest pain, no congestion, no fever, no headaches, no myalgias, no nausea, no rhinorrhea, no shortness of breath, no vomiting and no wheezing     Past Medical History:  Diagnosis Date  . Bulging of cervical intervertebral disc    2 discs  . CONTACT DERMATITIS&OTHER ECZEMA DUE UNSPEC CAUSE   . DEGENERATIVE JOINT DISEASE, CERVICAL SPINE   . DYSLIPIDEMIA   . GERD   . GLUCOSE INTOLERANCE, HX OF   . HYPERTENSION   . Pinched nerve in neck   . Tear of LCL (lateral collateral ligament) of knee 08/2014   R knee pain, dx on [place], s/p IAC injection  . VENEREAL WART     Patient Active Problem List   Diagnosis Date Noted  . Sore throat 02/23/2018  . Right leg pain 09/01/2017  . Hyperglycemia 06/23/2017  . Osteopenia 06/22/2016  . Schatzki's ring 08/18/2015  . Hiatal hernia, sm 08/18/2015  . Stricture and stenosis of esophagus 06/22/2015  . Cervical herniated disc 06/22/2015  . Eczema 06/22/2015  . Osteoarthritis  of right knee 06/15/2015  . Obese 12/15/2014  . Dyslipidemia 05/19/2008  . Essential hypertension 05/19/2008  . VENEREAL WART 04/04/2008  . Cancer of skin, squamous cell 04/04/2008  . GERD 04/04/2008    Past Surgical History:  Procedure Laterality Date  . [place] STUDY N/A 09/28/2015   Procedure: [place] STUDY;  Surgeon: Doran Stabler, MD;  Location: [facility];  Service: Gastroenterology;  Laterality: N/A;  . CHOLECYSTECTOMY  1992  . ESOPHAGEAL MANOMETRY N/A 09/28/2015   Procedure: ESOPHAGEAL MANOMETRY (EM);  Surgeon: Doran Stabler, MD;  Location: [facility];  Service: Gastroenterology;  Laterality: N/A;  IMPEADANCE   . FOOT FRACTURE SURGERY Left 2006  . MOHS SURGERY  2001   forehead-basal cell  . swallowing test       OB History    Gravida  2   Para  0   Term      Preterm      AB      Living  2     SAB      TAB      Ectopic      Multiple      Live Births               Home Medications    Prior to Admission medications  Medication Sig Start Date End Date Taking? Authorizing Provider  amLODipine (NORVASC) 5 MG tablet TAKE ONE TABLET BY MOUTH ONE TIME DAILY  11/06/17   Binnie Rail, MD  amoxicillin-clavulanate (AUGMENTIN) 875-125 MG tablet Take 1 tablet by mouth 2 (two) times daily. One po bid x 7 days 02/26/18   Deno Etienne, DO  Ascorbic Acid (VITAMIN C) 1000 MG tablet Take 1,000 mg by mouth daily.      [provider]  Calcium-Vitamin D (CALTRATE 600 PLUS-VIT D PO) Take 1 each by mouth daily.      [provider]  ciprofloxacin-dexamethasone (CIPRODEX) OTIC suspension Place 4 drops into the left ear 2 (two) times daily. 02/26/18   Deno Etienne, DO  cyclobenzaprine (FLEXERIL) 10 MG tablet Take 1 tablet (10 mg total) by mouth 2 (two) times daily as needed. 10/30/17   Binnie Rail, MD  fluorometholone (FML) 0.1 % ophthalmic suspension  02/08/18   [provider]  lidocaine (XYLOCAINE) 2 % solution Use as directed  15 mLs in the mouth or throat every 3 (three) hours as needed for mouth pain. 02/23/18   Hoyt Koch, MD  Multiple Vitamins-Minerals (MULTIVITAMIN,TX-MINERALS) tablet Take 1 tablet by mouth daily.      [provider]    Family History Family History  Problem Relation Age of Onset  . Colon cancer Father   . Diabetes Father   . Stroke Father   . Colon cancer Brother   . Depression Mother   . Alcohol abuse Mother   . Heart attack Mother 17       SMOKER  . Alcohol abuse Sister   . Depression Sister   . Alcohol abuse Brother   . Depression Brother   . Colon cancer Brother   . Arthritis Other        grandparent  . Hypertension Other        parent, other relative  . Stroke Other        parent, other relative  . Heart attack Cousin     Social History Social History   Tobacco Use  . Smoking status: Never Smoker  . Smokeless tobacco: Never Used  Substance Use Topics  . Alcohol use: No    Alcohol/week: 0.0 standard drinks  . Drug use: No     Allergies   Dexlansoprazole; Doxycycline; Ergostat [ergotamine tartrate]; Erythromycin; Influenza vaccines; Lisinopril; Omeprazole; Pantoprazole sodium; Phentermine; Prilosec [omeprazole magnesium]; Propranolol; Ranitidine; Statins; and Verapamil   Review of Systems Review of Systems  Constitutional: Negative for chills and fever.  HENT: Positive for ear pain and sore throat. Negative for congestion and rhinorrhea.   Eyes: Negative for redness and visual disturbance.  Respiratory: Negative for shortness of breath and wheezing.   Cardiovascular: Negative for chest pain and palpitations.  Gastrointestinal: Negative for nausea and vomiting.  Genitourinary: Negative for dysuria and urgency.  Musculoskeletal: Negative for arthralgias and myalgias.  Skin: Negative for pallor and wound.  Neurological: Negative for dizziness and headaches.     Physical Exam Updated Vital Signs BP (!) 135/93 (BP Location: Left Arm)    Pulse 96   Temp 98.1 F (36.7 C) (Oral)   Resp 16   Ht 5\' 1"  (1.549 m)   Wt 82.6 kg   SpO2 99%   BMI 34.39 kg/m   Physical Exam Vitals signs and nursing note reviewed.  Constitutional:      General: She is not in acute distress.    Appearance: She is well-developed. She is  not diaphoretic.  HENT:     Head: Normocephalic and atraumatic.     Comments: Erythema and edema to the left external ear.  Swollen ear canal, TM appears to be normal.  Swelling underneath the jaw, mild erythema palpable nodular area consistent with a lymph node.  Bilateral tonsillar exudates and edema.  No noted dental swelling pain or abscess.  No noted facial nerve palsy Eyes:     Pupils: Pupils are equal, round, and reactive to light.  Neck:     Musculoskeletal: Normal range of motion and neck supple.  Cardiovascular:     Rate and Rhythm: Normal rate and regular rhythm.     Heart sounds: No murmur. No friction rub. No gallop.   Pulmonary:     Effort: Pulmonary effort is normal.     Breath sounds: No wheezing or rales.  Abdominal:     General: There is no distension.     Palpations: Abdomen is soft.     Tenderness: There is no abdominal tenderness.  Musculoskeletal:        General: No tenderness.  Skin:    General: Skin is warm and dry.  Neurological:     Mental Status: She is alert and oriented to person, place, and time.  Psychiatric:        Behavior: Behavior normal.      ED Treatments / Results  Labs (all labs ordered are listed, but only abnormal results are displayed) Labs Reviewed  CBC WITH DIFFERENTIAL/PLATELET  BASIC METABOLIC PANEL  I-STAT CREATININE, ED    EKG None  Radiology Ct Soft Tissue Neck W Contrast  Result Date: 02/26/2018 CLINICAL DATA:  Pain behind LEFT ear.  Possible abscess. EXAM: CT NECK WITH CONTRAST TECHNIQUE: Multidetector CT imaging of the neck was performed using the standard protocol following the bolus administration of intravenous contrast. CONTRAST:   22mL OMNIPAQUE IOHEXOL 300 MG/ML  SOLN COMPARISON:  None. FINDINGS: Pharynx and larynx: Normal. No mass or swelling. Salivary glands: No inflammation, mass, or stone. Thyroid: Normal. Lymph nodes: None enlarged or abnormal density. Slight reactive adenopathy on the LEFT, level I, II, and V. Vascular: Negative. Dural venous sinuses are patent, with specific attention to the LEFT transverse and sigmoid sinus. Limited intracranial: Negative. No signs of cerebral abscess or subdural empyema. Visualized orbits: Negative. Mastoids and visualized paranasal sinuses: Clear. Skeleton: No acute or aggressive process. Upper chest: Negative.  Aortic atherosclerosis. Other: Carotid bifurcation atherosclerosis. There is inflammatory change surrounding the pinna and external canal of the LEFT ear. No abscess. No osseous changes are observed to suggest malignant otitis externa. No mastoid fluid is evident. IMPRESSION: Relatively mild changes of what appears to be uncomplicated LEFT external otitis. No bony or mastoid involvement. No involvement of the regional soft tissues or adjacent salivary gland. No osseous changes. No frank abscess. Close clinical correlation recommended for signs which might indicate malignant otitis externa, a more ominous process, such as facial nerve palsy, or cerebral symptoms. Electronically Signed   By: Staci Righter M.D.   On: 02/26/2018 09:57    Procedures Procedures (including critical care time)  Medications Ordered in ED Medications  sodium chloride (PF) 0.9 % injection (has no administration in time range)  sodium chloride 0.9 % bolus 1,000 mL (0 mLs Intravenous Stopped 02/26/18 0950)  iohexol (OMNIPAQUE) 300 MG/ML solution 75 mL (75 mLs Intravenous Contrast Given 02/26/18 0926)     Initial Impression / Assessment and Plan / ED Course  I have reviewed the triage vital signs  and the nursing notes.  Pertinent labs & imaging results that were available during my care of the patient were  reviewed by me and considered in my medical decision making (see chart for details).     68 yo F with a chief complaint of left-sided facial pain and swelling.  Appears to have lymphadenitis clinically, I am unsure why the external ear is also swollen question cellulitis versus underlying process.  She also appears to have tonsillitis.  Will obtain a CT scan to further evaluate.  Ct with concern for otitis externa.  Clinically with pharyngitis will also add oral antibiotics.  With atypical presentation will refer to ENT.     10:41 AM:  I have discussed the diagnosis/risks/treatment options with the patient and family and believe the pt to be eligible for discharge home to follow-up with PCP, ENT. We also discussed returning to the ED immediately if new or worsening sx occur. We discussed the sx which are most concerning (e.g., sudden worsening pain, fever, inability to tolerate by mouth) that necessitate immediate return. Medications administered to the patient during their visit and any new prescriptions provided to the patient are listed below.  Medications given during this visit Medications  sodium chloride (PF) 0.9 % injection (has no administration in time range)  sodium chloride 0.9 % bolus 1,000 mL (0 mLs Intravenous Stopped 02/26/18 0950)  iohexol (OMNIPAQUE) 300 MG/ML solution 75 mL (75 mLs Intravenous Contrast Given 02/26/18 0926)     The patient appears reasonably screen and/or stabilized for discharge and I doubt any other medical condition or other [facility] requiring further screening, evaluation, or treatment in the ED at this time prior to discharge.    Final Clinical Impressions(s) / ED Diagnoses   Final diagnoses:  Acute otitis externa of left ear, unspecified type  Acute bacterial pharyngitis    ED Discharge Orders         Ordered    amoxicillin-clavulanate (AUGMENTIN) 875-125 MG tablet  2 times daily     02/26/18 1020    ciprofloxacin-dexamethasone (CIPRODEX) OTIC  suspension  2 times daily     02/26/18 [place], Nasrin Lanzo, DO 02/26/18 [place], [place], DO 02/26/18 1041

## 2018-02-26 NOTE — ED Triage Notes (Signed)
Patient discovered the abscess behind left ear last week. Patient states it is painful.

## 2018-02-28 ENCOUNTER — Encounter: Payer: Self-pay | Admitting: Family Medicine

## 2018-02-28 ENCOUNTER — Other Ambulatory Visit: Payer: Self-pay

## 2018-02-28 ENCOUNTER — Ambulatory Visit: Payer: Self-pay | Admitting: *Deleted

## 2018-02-28 ENCOUNTER — Ambulatory Visit (INDEPENDENT_AMBULATORY_CARE_PROVIDER_SITE_OTHER): Payer: PPO | Admitting: Family Medicine

## 2018-02-28 ENCOUNTER — Encounter (HOSPITAL_COMMUNITY): Payer: Self-pay

## 2018-02-28 ENCOUNTER — Emergency Department (HOSPITAL_COMMUNITY)
Admission: EM | Admit: 2018-02-28 | Discharge: 2018-02-28 | Discharge status: Against Medical Advice | Payer: PPO | Attending: Emergency Medicine | Admitting: Emergency Medicine

## 2018-02-28 VITALS — BP 150/96 | HR 97 | Temp 98.1°F | Ht 61.0 in

## 2018-02-28 DIAGNOSIS — H9209 Otalgia, unspecified ear: Secondary | ICD-10-CM | POA: Insufficient documentation

## 2018-02-28 DIAGNOSIS — T3695XA Adverse effect of unspecified systemic antibiotic, initial encounter: Secondary | ICD-10-CM

## 2018-02-28 DIAGNOSIS — J029 Acute pharyngitis, unspecified: Secondary | ICD-10-CM | POA: Diagnosis not present

## 2018-02-28 DIAGNOSIS — H60502 Unspecified acute noninfective otitis externa, left ear: Secondary | ICD-10-CM | POA: Diagnosis not present

## 2018-02-28 DIAGNOSIS — Z5321 Procedure and treatment not carried out due to patient leaving prior to being seen by health care provider: Secondary | ICD-10-CM | POA: Insufficient documentation

## 2018-02-28 MED ORDER — CEPHALEXIN 500 MG PO CAPS: 500.0000 mg | ORAL_CAPSULE | Freq: Two times a day (BID) | ORAL | 0 refills | Status: AC
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
## 2018-02-28 NOTE — Patient Instructions (Addendum)
Stop augmentin Continue with ciprodex ear drops Start keflex  See ENT tomorrow

## 2018-02-28 NOTE — Telephone Encounter (Signed)
Colleen Plowman, RN from [facility] called stating that the pt went to ED on 02/26/2018 with ear pain and was diagnosed with outer ear infection, the pt called nurse call line on 02/27/2018 with complaints of left lymph node swelling; on 2/19 the pt complains of dizziness and vomiting x 5 on 02/27/2018; conference call initiated with pt per [name]; the pt complains of being dizzy when she stands started 02/28/2018; the pt has an appointment with [name], ENT on 03/01/2018; the pt says that she has been able to keep down water and oatmeal, and she is urinating and having BMs (last 02/28/2018 at 0700); recommendations made per nurse triage protocol but the pt's husband refuses to take her back to the ED; the pt normally sees [name], [name] but she is not available; spoke with [name] in regards to scheduling pt; there is no availability in office today; pt agreeable to being seen at another location; pt offered and accepted appointment with [name], [name], 02/28/2018 at 1530; also notified [name] at [facility]; will route to both offices for notification.        Reason for Disposition . SEVERE dizziness (e.g., unable to stand, requires support to walk, feels like passing out now)  Answer Assessment - Initial Assessment Questions 1. DESCRIPTION: "Describe your dizziness."     Feels like room is spinning; feels off balance 2. LIGHTHEADED: "Do you feel lightheaded?" (e.g., somewhat faint, woozy, weak upon standing)     Weak upon standing 3. VERTIGO: "Do you feel like either you or the room is spinning or tilting?" (i.e. vertigo)     yes 4. SEVERITY: "How bad is it?"  "Do you feel like you are going to faint?" "Can you stand and walk?"   - MILD - walking normally   - MODERATE - interferes with normal activities (e.g., work, school)    - SEVERE - unable to stand, requires support to walk, feels like passing out now.      Requires support to work 5. ONSET:  "When did the dizziness  begin?"     03/10/2018 at 0700 6. AGGRAVATING FACTORS: "Does anything make it worse?" (e.g., standing, change in head position)     Standing; changing position of head 7. HEART RATE: "Can you tell me your heart rate?" "How many beats in 15 seconds?"  (Note: not all patients can do this)       Pt not able to complete this task 8. CAUSE: "What do you think is causing the dizziness?"     "something to do with ear" 9. RECURRENT SYMPTOM: "Have you had dizziness before?" If so, ask: "When was the last time?" "What happened that time?"     Pt seen in ED on 02/26/2018 10. OTHER SYMPTOMS: "Do you have any other symptoms?" (e.g., fever, chest pain, vomiting, diarrhea, bleeding)      5 episodes of vomiting on 02/27/2018;  11. PREGNANCY: "Is there any chance you are pregnant?" "When was your last menstrual period?"       no  Protocols used: DIZZINESS [facility]

## 2018-02-28 NOTE — ED Triage Notes (Signed)
Pt states she was seen here yesterday for swelling behind her left ear. States she has been vomiting today. Took her antibiotics today, concerned they have not started working.

## 2018-02-28 NOTE — Telephone Encounter (Signed)
Pt being seen at [place] office today.

## 2018-02-28 NOTE — Patient Outreach (Addendum)
[name]) Care Management  02/28/2018  Colleen Walsh 05/25/1950 [id number]   Nurse Call Line Referral Date: 02/28/18 Reason for Referral: pain and swelling in lymph nodes Nurse call line recommendation: see primary MD.   Telephone call to patient regarding nurse call line. HIPAA verified with patient. Explained reason for call. Patient states she did not call her doctors office. She state she does not have anyone to drive her to the doctors.   Patient states her husband is not at home he is out running errands. Patient states she was seen in the emergency on 02/26/18 for left sided ear pain.  She states she called the nurse advise line on yesterday because she has a swollen mass that is painful on the left side of her face.  Patient states she is scheduled to see the ENT doctor on tomorrow but reports she is concerned because she has vomited x4 and is experiencing dizziness.  RNCM again discussed with patient her transportation options.  Patient states she can possible have her mother in law drive her. RNCM offered to call her primary MD office to report symptoms and attempt to get appointment scheduled  for today.  Patient verbally agreed.  RNCM contacted patients primary MD office and spoke with [facility]. Informed Katrice of patients symptoms and request patient to be seen by primary. Katrice requested a conference call with patient so that she can triage.  Conference call made with patient and Katrice with primary MD office.  Patient asked if she could get to a doctors appointment today. Patient states her husband will take her. Katrice states patients primary MD is out of the office this week. She states she will check for other provider schedules.  RNCM completed conference call. Katrice and patient remained on the line together for appointment arrangement.    PLAN: RNCM will close patient due to patient being assessed and having no further needs.   [name]  RN,BSN,CCM [facility] Telephonic  [phone]     call

## 2018-03-01 DIAGNOSIS — B028 Zoster with other complications: Secondary | ICD-10-CM | POA: Diagnosis not present

## 2018-03-01 DIAGNOSIS — R29810 Facial weakness: Secondary | ICD-10-CM | POA: Diagnosis not present

## 2018-03-01 DIAGNOSIS — B0221 Postherpetic geniculate ganglionitis: Secondary | ICD-10-CM | POA: Diagnosis not present

## 2018-03-05 ENCOUNTER — Telehealth: Payer: Self-pay | Admitting: Internal Medicine

## 2018-03-05 NOTE — Telephone Encounter (Signed)
FYI

## 2018-03-05 NOTE — Telephone Encounter (Signed)
Copied from [place] [id number]. Topic: General - Other >> Mar 05, 2018 10:41 AM Alfredia Ferguson R wrote: Patient called in and stated she went to the ENT and was diagnosed with Shingles in her ear. She stated she was told by Ronnald Nian, DO  to give a follow up call

## 2018-03-06 ENCOUNTER — Telehealth: Payer: Self-pay | Admitting: Gastroenterology

## 2018-03-06 NOTE — Telephone Encounter (Signed)
Thank you :)

## 2018-03-06 NOTE — Telephone Encounter (Signed)
Pt states she has been diagnosed with shingles and she was told not to be out around people. Reports the Valtrex and an antibiotic she was given have caused her reflux to be very bad and she has very dry eyes. She feels the increase in reflux is related to the dry eyes. Discussed with pt that all the meds we use to treat reflux are listed as allergies for her and that we do not treat dry eyes. She has tried OTC eye drops and prescription and states they did not work. Let pt know I would send the note to Dr. Loletha Carrow to see if he has any recommendations. Please advise.

## 2018-03-06 NOTE — Telephone Encounter (Signed)
Upper digestive upset common from valacyclovir used to treat shingles. Famotidine is the only acid reducing medicine she has tolerated.  20 mg tablets available OTC, take two tablets twice daily.   I expect symptoms to settle down when done with shingles medicine.  Dry eyes not causing the heartburn, don't know what to recommend for that.

## 2018-03-07 NOTE — Telephone Encounter (Signed)
Spoke with pt and she is aware.

## 2018-03-08 DIAGNOSIS — R29810 Facial weakness: Secondary | ICD-10-CM | POA: Diagnosis not present

## 2018-03-08 DIAGNOSIS — H16212 Exposure keratoconjunctivitis, left eye: Secondary | ICD-10-CM | POA: Diagnosis not present

## 2018-03-08 DIAGNOSIS — Z8669 Personal history of other diseases of the nervous system and sense organs: Secondary | ICD-10-CM | POA: Diagnosis not present

## 2018-03-08 DIAGNOSIS — H02209 Unspecified lagophthalmos unspecified eye, unspecified eyelid: Secondary | ICD-10-CM | POA: Diagnosis not present

## 2018-03-08 DIAGNOSIS — B0221 Postherpetic geniculate ganglionitis: Secondary | ICD-10-CM | POA: Diagnosis not present

## 2018-03-08 DIAGNOSIS — B029 Zoster without complications: Secondary | ICD-10-CM | POA: Diagnosis not present

## 2018-03-12 DIAGNOSIS — H16232 Neurotrophic keratoconjunctivitis, left eye: Secondary | ICD-10-CM | POA: Diagnosis not present

## 2018-03-12 DIAGNOSIS — H16212 Exposure keratoconjunctivitis, left eye: Secondary | ICD-10-CM | POA: Diagnosis not present

## 2018-03-12 DIAGNOSIS — H02209 Unspecified lagophthalmos unspecified eye, unspecified eyelid: Secondary | ICD-10-CM | POA: Diagnosis not present

## 2018-03-12 DIAGNOSIS — H18812 Anesthesia and hypoesthesia of cornea, left eye: Secondary | ICD-10-CM | POA: Diagnosis not present

## 2018-03-14 DIAGNOSIS — H18812 Anesthesia and hypoesthesia of cornea, left eye: Secondary | ICD-10-CM | POA: Diagnosis not present

## 2018-03-14 DIAGNOSIS — H16212 Exposure keratoconjunctivitis, left eye: Secondary | ICD-10-CM | POA: Diagnosis not present

## 2018-03-14 DIAGNOSIS — H16232 Neurotrophic keratoconjunctivitis, left eye: Secondary | ICD-10-CM | POA: Diagnosis not present

## 2018-03-14 DIAGNOSIS — H02209 Unspecified lagophthalmos unspecified eye, unspecified eyelid: Secondary | ICD-10-CM | POA: Diagnosis not present

## 2018-03-16 DIAGNOSIS — Z8669 Personal history of other diseases of the nervous system and sense organs: Secondary | ICD-10-CM | POA: Diagnosis not present

## 2018-03-16 DIAGNOSIS — H02209 Unspecified lagophthalmos unspecified eye, unspecified eyelid: Secondary | ICD-10-CM | POA: Diagnosis not present

## 2018-03-16 DIAGNOSIS — H16212 Exposure keratoconjunctivitis, left eye: Secondary | ICD-10-CM | POA: Diagnosis not present

## 2018-03-16 DIAGNOSIS — H16232 Neurotrophic keratoconjunctivitis, left eye: Secondary | ICD-10-CM | POA: Diagnosis not present

## 2018-03-22 DIAGNOSIS — H16232 Neurotrophic keratoconjunctivitis, left eye: Secondary | ICD-10-CM | POA: Diagnosis not present

## 2018-03-22 DIAGNOSIS — H02209 Unspecified lagophthalmos unspecified eye, unspecified eyelid: Secondary | ICD-10-CM | POA: Diagnosis not present

## 2018-03-22 DIAGNOSIS — Z8669 Personal history of other diseases of the nervous system and sense organs: Secondary | ICD-10-CM | POA: Diagnosis not present

## 2018-03-22 DIAGNOSIS — H16212 Exposure keratoconjunctivitis, left eye: Secondary | ICD-10-CM | POA: Diagnosis not present
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
## 2018-03-30 ENCOUNTER — Other Ambulatory Visit: Payer: Self-pay

## 2018-03-30 ENCOUNTER — Encounter: Payer: Self-pay | Admitting: Internal Medicine

## 2018-03-30 ENCOUNTER — Ambulatory Visit (INDEPENDENT_AMBULATORY_CARE_PROVIDER_SITE_OTHER): Payer: PPO | Admitting: Internal Medicine

## 2018-03-30 VITALS — BP 126/80 | HR 81 | Temp 98.9°F | Resp 18 | Ht 61.0 in | Wt 176.0 lb

## 2018-03-30 DIAGNOSIS — B0229 Other postherpetic nervous system involvement: Secondary | ICD-10-CM

## 2018-03-30 NOTE — Assessment & Plan Note (Signed)
Symptoms are not active shingles - this is neuralgia pain Discussed gabapentin at length including side effects, dosing and how to take medication - advised taking it at night Can adjust gabapentin via mychart Tylenol prn, advil/excedrin prn - take with food Ice, topical lidocaine gel  She will let me know if she has any concerns

## 2018-03-30 NOTE — Patient Instructions (Addendum)
Continue the gabapentin and increase slowly.  Send a Pharmacist, community message with any questions.   Take tylenol, advil as needed.      Postherpetic Neuralgia Postherpetic neuralgia (PHN) is nerve pain that occurs after a shingles infection. Shingles is a painful rash that appears on one area of the body, usually on the trunk or face. Shingles is caused by the varicella-zoster virus. This is the same virus that causes chickenpox. In people who have had chickenpox, the virus can resurface years later and cause shingles. You may have PHN if you continue to have pain for 4 months after your shingles rash has gone away. PHN appears in the same area where you had the shingles rash. The pain usually goes away after the rash disappears. Getting a vaccination for shingles can prevent PHN. This vaccine is recommended for people older than 60. It may prevent shingles, and may also lower your risk of PHN if you do get shingles. What are the causes? This condition is caused by damage to your nerves from the varicella-zoster virus. The damage makes your nerves overly sensitive. What increases the risk? The following factors may make you more likely to develop this condition:  Being older than 67 years of age.  Having severe pain before your shingles rash starts.  Having a severe rash.  Having shingles in and around the eye area.  Having a disease that makes your body unable to fight infections (weak immune system). What are the signs or symptoms? The main symptom of this condition is pain. The pain may:  Often be very bad and may be described as stabbing, burning, or feeling like an electric shock.  Come and go or may be there all the time.  Be triggered by light touches on the skin or changes in temperature. You may have itching along with the pain. How is this diagnosed? This condition may be diagnosed based on your symptoms and your history of shingles. Lab studies and other diagnostic tests are  usually not needed. How is this treated? There is no cure for this condition. Treatment for PHN will focus on pain relief. Over-the-counter pain relievers do not usually relieve PHN pain. You may need to work with a pain specialist. Treatment may include:  Antidepressant medicines to help with pain and improve sleep.  Anti-seizure medicines to relieve nerve pain.  Strong pain relievers (opioids).  A numbing patch worn on the skin (lidocaine patch).  Botox (botulinum toxin) injections to block pain signals between nerves and muscles.  Injections of numbing medicine or anti-inflammatory medicines around irritated nerves. Follow these instructions at home:   It may take a long time to recover from PHN. Work closely with your health care provider and develop a good support system at home.  Take over-the-counter and prescription medicines only as told by your health care provider.  Do not drive or use heavy machinery while taking prescription pain medicine.  Wear loose, comfortable clothing.  Cover sensitive areas with a dressing to reduce friction from clothing rubbing on the area.  If directed, put ice on the painful area: ? Put ice in a plastic bag. ? Place a towel between your skin and the bag. ? Leave the ice on for 20 minutes, 2-3 times a day.  Talk to your health care provider if you feel depressed or desperate. Living with long-term pain can be depressing.  Keep all follow-up visits as told by your health care provider. This is important. Contact a health care provider  if:  Your medicine is not helping.  You are struggling to manage your pain at home. Summary  Postherpetic neuralgia is a very painful disorder that can occur after an episode of shingles.  The pain is often severe, burning, electric, or stabbing.  Prescription medicines can be helpful in managing persistent pain.  Getting a vaccination for shingles can prevent PHN. This vaccine is recommended for  people older than 60. This information is not intended to replace advice given to you by your health care provider. Make sure you discuss any questions you have with your health care provider. Document Released: 03/19/2002 Document Revised: 03/15/2016 Document Reviewed: 03/15/2016 Elsevier Interactive Patient Education  2019 [employer].

## 2018-04-02 ENCOUNTER — Encounter: Payer: Self-pay | Admitting: Internal Medicine

## 2018-04-03 MED ORDER — HYDROCODONE-ACETAMINOPHEN 5-325 MG PO TABS: 1.0000 | ORAL_TABLET | Freq: Four times a day (QID) | ORAL | 0 refills | Status: DC | PRN

## 2018-04-03 NOTE — Addendum Note (Signed)
Addended by: Binnie Rail on: 04/03/2018 04:22 PM   Modules accepted: Orders

## 2018-04-03 NOTE — Addendum Note (Signed)
Addended by: Binnie Rail on: 04/03/2018 03:06 PM   Modules accepted: Orders

## 2018-04-08 ENCOUNTER — Encounter: Payer: Self-pay | Admitting: Internal Medicine

## 2018-04-11 ENCOUNTER — Encounter: Payer: Self-pay | Admitting: Internal Medicine

## 2018-05-10 ENCOUNTER — Other Ambulatory Visit: Payer: Self-pay | Admitting: Internal Medicine

## 2018-05-10 MED ORDER — CYCLOBENZAPRINE HCL 10 MG PO TABS: 10.0000 mg | ORAL_TABLET | Freq: Two times a day (BID) | ORAL | 0 refills | Status: DC | PRN

## 2018-05-10 NOTE — Telephone Encounter (Signed)
pls advise if ok to refill../lmb 

## 2018-05-11 ENCOUNTER — Telehealth: Payer: Self-pay | Admitting: Internal Medicine

## 2018-05-11 MED ORDER — CYCLOBENZAPRINE HCL 10 MG PO TABS: 10.0000 mg | ORAL_TABLET | Freq: Two times a day (BID) | ORAL | 0 refills | Status: DC | PRN

## 2018-05-11 NOTE — Telephone Encounter (Signed)
Copied from [place] [id number]. Topic: Quick Communication - Rx Refill/Question >> May 11, 2018  2:13 PM Reyne Dumas L wrote: Medication: cyclobenzaprine (FLEXERIL) 10 MG tablet  Pt states this medication was called into the wrong pharmacy.  She needs to have it called in to [facility].  Preferred Pharmacy (with phone number or street name): [place], [place] [phone] (Phone) [phone] (Fax)  Agent: Please be advised that RX refills may take up to 3 business days. We ask that you follow-up with your pharmacy.

## 2018-05-11 NOTE — Telephone Encounter (Signed)
Resent

## 2018-06-20 DIAGNOSIS — L820 Inflamed seborrheic keratosis: Secondary | ICD-10-CM | POA: Diagnosis not present

## 2018-06-20 DIAGNOSIS — Z08 Encounter for follow-up examination after completed treatment for malignant neoplasm: Secondary | ICD-10-CM | POA: Diagnosis not present

## 2018-06-20 DIAGNOSIS — Z1283 Encounter for screening for malignant neoplasm of skin: Secondary | ICD-10-CM | POA: Diagnosis not present

## 2018-06-20 DIAGNOSIS — D225 Melanocytic nevi of trunk: Secondary | ICD-10-CM | POA: Diagnosis not present

## 2018-06-20 DIAGNOSIS — Z85828 Personal history of other malignant neoplasm of skin: Secondary | ICD-10-CM | POA: Diagnosis not present

## 2018-06-25 NOTE — Patient Instructions (Addendum)
Tests ordered today. Your results will be released to Orin (or called to you) after review, usually within 72hours after test completion. If any changes need to be made, you will be notified at that same time.  All other Health Maintenance issues reviewed.   All recommended immunizations and age-appropriate screenings are up-to-date or discussed.  pneumonia immunizations administered today.   Medications reviewed and updated.  Changes include :  none   Your prescription(s) have been submitted to your pharmacy. Please take as directed and contact our office if you believe you are having problem(s) with the medication(s).   Please followup in one year  Health Maintenance, Female Adopting a healthy lifestyle and getting preventive care can go a long way to promote health and wellness. Talk with your health care provider about what schedule of regular examinations is right for you. This is a good chance for you to check in with your provider about disease prevention and staying healthy. In between checkups, there are plenty of things you can do on your own. Experts have done a lot of research about which lifestyle changes and preventive measures are most likely to keep you healthy. Ask your health care provider for more information. Weight and diet Eat a healthy diet  Be sure to include plenty of vegetables, fruits, low-fat dairy products, and lean protein.  Do not eat a lot of foods high in solid fats, added sugars, or salt.  Get regular exercise. This is one of the most important things you can do for your health. ? Most adults should exercise for at least 150 minutes each week. The exercise should increase your heart rate and make you sweat (moderate-intensity exercise). ? Most adults should also do strengthening exercises at least twice a week. This is in addition to the moderate-intensity exercise. Maintain a healthy weight  Body mass index (BMI) is a measurement that can be used to  identify possible weight problems. It estimates body fat based on height and weight. Your health care provider can help determine your BMI and help you achieve or maintain a healthy weight.  For females 23 years of age and older: ? A BMI below 18.5 is considered underweight. ? A BMI of 18.5 to 24.9 is normal. ? A BMI of 25 to 29.9 is considered overweight. ? A BMI of 30 and above is considered obese. Watch levels of cholesterol and blood lipids  You should start having your blood tested for lipids and cholesterol at 66 years of age, then have this test every 5 years.  You may need to have your cholesterol levels checked more often if: ? Your lipid or cholesterol levels are high. ? You are older than 66 years of age. ? You are at high risk for heart disease. Cancer screening Lung Cancer  Lung cancer screening is recommended for adults 65-83 years old who are at high risk for lung cancer because of a history of smoking.  A yearly low-dose CT scan of the lungs is recommended for people who: ? Currently smoke. ? Have quit within the past 15 years. ? Have at least a 30-pack-year history of smoking. A pack year is smoking an average of one pack of cigarettes a day for 1 year.  Yearly screening should continue until it has been 15 years since you quit.  Yearly screening should stop if you develop a health problem that would prevent you from having lung cancer treatment. Breast Cancer  Practice breast self-awareness. This means understanding how your  your breasts normally appear and feel.  It also means doing regular breast self-exams. Let your health care provider know about any changes, no matter how small.  If you are in your 20s or 30s, you should have a clinical breast exam (CBE) by a health care provider every 1-3 years as part of a regular health exam.  If you are 40 or older, have a CBE every year. Also consider having a breast X-ray (mammogram) every year.  If you have a family  history of breast cancer, talk to your health care provider about genetic screening.  If you are at high risk for breast cancer, talk to your health care provider about having an MRI and a mammogram every year.  Breast cancer gene (BRCA) assessment is recommended for women who have family members with BRCA-related cancers. BRCA-related cancers include: ? Breast. ? Ovarian. ? Tubal. ? Peritoneal cancers.  Results of the assessment will determine the need for genetic counseling and BRCA1 and BRCA2 testing. Cervical Cancer Your health care provider may recommend that you be screened regularly for cancer of the pelvic organs (ovaries, uterus, and vagina). This screening involves a pelvic examination, including checking for microscopic changes to the surface of your cervix (Pap test). You may be encouraged to have this screening done every 3 years, beginning at age 21.  For women ages 30-65, health care providers may recommend pelvic exams and Pap testing every 3 years, or they may recommend the Pap and pelvic exam, combined with testing for human papilloma virus (HPV), every 5 years. Some types of HPV increase your risk of cervical cancer. Testing for HPV may also be done on women of any age with unclear Pap test results.  Other health care providers may not recommend any screening for nonpregnant women who are considered low risk for pelvic cancer and who do not have symptoms. Ask your health care provider if a screening pelvic exam is right for you.  If you have had past treatment for cervical cancer or a condition that could lead to cancer, you need Pap tests and screening for cancer for at least 20 years after your treatment. If Pap tests have been discontinued, your risk factors (such as having a new sexual partner) need to be reassessed to determine if screening should resume. Some women have medical problems that increase the chance of getting cervical cancer. In these cases, your health care  provider may recommend more frequent screening and Pap tests. Colorectal Cancer  This type of cancer can be detected and often prevented.  Routine colorectal cancer screening usually begins at 66 years of age and continues through 66 years of age.  Your health care provider may recommend screening at an earlier age if you have risk factors for colon cancer.  Your health care provider may also recommend using home test kits to check for hidden blood in the stool.  A small camera at the end of a tube can be used to examine your colon directly (sigmoidoscopy or colonoscopy). This is done to check for the earliest forms of colorectal cancer.  Routine screening usually begins at age 50.  Direct examination of the colon should be repeated every 5-10 years through 66 years of age. However, you may need to be screened more often if early forms of precancerous polyps or small growths are found. Skin Cancer  Check your skin from head to toe regularly.  Tell your health care provider about any new moles or changes in moles, especially   if there is a change in a mole's shape or color.  Also tell your health care provider if you have a mole that is larger than the size of a pencil eraser.  Always use sunscreen. Apply sunscreen liberally and repeatedly throughout the day.  Protect yourself by wearing long sleeves, pants, a wide-brimmed hat, and sunglasses whenever you are outside. Heart disease, diabetes, and high blood pressure  High blood pressure causes heart disease and increases the risk of stroke. High blood pressure is more likely to develop in: ? People who have blood pressure in the high end of the normal range (130-139/85-89 mm Hg). ? People who are overweight or obese. ? People who are African American.  If you are 18-39 years of age, have your blood pressure checked every 3-5 years. If you are 40 years of age or older, have your blood pressure checked every year. You should have your  blood pressure measured twice-once when you are at a hospital or clinic, and once when you are not at a hospital or clinic. Record the average of the two measurements. To check your blood pressure when you are not at a hospital or clinic, you can use: ? An automated blood pressure machine at a pharmacy. ? A home blood pressure monitor.  If you are between 55 years and 79 years old, ask your health care provider if you should take aspirin to prevent strokes.  Have regular diabetes screenings. This involves taking a blood sample to check your fasting blood sugar level. ? If you are at a normal weight and have a low risk for diabetes, have this test once every three years after 66 years of age. ? If you are overweight and have a high risk for diabetes, consider being tested at a younger age or more often. Preventing infection Hepatitis B  If you have a higher risk for hepatitis B, you should be screened for this virus. You are considered at high risk for hepatitis B if: ? You were born in a country where hepatitis B is common. Ask your health care provider which countries are considered high risk. ? Your parents were born in a high-risk country, and you have not been immunized against hepatitis B (hepatitis B vaccine). ? You have HIV or AIDS. ? You use needles to inject street drugs. ? You live with someone who has hepatitis B. ? You have had sex with someone who has hepatitis B. ? You get hemodialysis treatment. ? You take certain medicines for conditions, including cancer, organ transplantation, and autoimmune conditions. Hepatitis C  Blood testing is recommended for: ? Everyone born from 1945 through 1965. ? Anyone with known risk factors for hepatitis C. Sexually transmitted infections (STIs)  You should be screened for sexually transmitted infections (STIs) including gonorrhea and chlamydia if: ? You are sexually active and are younger than 66 years of age. ? You are older than 66  years of age and your health care provider tells you that you are at risk for this type of infection. ? Your sexual activity has changed since you were last screened and you are at an increased risk for chlamydia or gonorrhea. Ask your health care provider if you are at risk.  If you do not have HIV, but are at risk, it may be recommended that you take a prescription medicine daily to prevent HIV infection. This is called pre-exposure prophylaxis (PrEP). You are considered at risk if: ? You are sexually active and do not   regularly use condoms or know the HIV status of your partner(s). ? You take drugs by injection. ? You are sexually active with a partner who has HIV. Talk with your health care provider about whether you are at high risk of being infected with HIV. If you choose to begin PrEP, you should first be tested for HIV. You should then be tested every 3 months for as long as you are taking PrEP. Pregnancy  If you are premenopausal and you may become pregnant, ask your health care provider about preconception counseling.  If you may become pregnant, take 400 to 800 micrograms (mcg) of folic acid every day.  If you want to prevent pregnancy, talk to your health care provider about birth control (contraception). Osteoporosis and menopause  Osteoporosis is a disease in which the bones lose minerals and strength with aging. This can result in serious bone fractures. Your risk for osteoporosis can be identified using a bone density scan.  If you are 65 years of age or older, or if you are at risk for osteoporosis and fractures, ask your health care provider if you should be screened.  Ask your health care provider whether you should take a calcium or vitamin D supplement to lower your risk for osteoporosis.  Menopause may have certain physical symptoms and risks.  Hormone replacement therapy may reduce some of these symptoms and risks. Talk to your health care provider about whether  hormone replacement therapy is right for you. Follow these instructions at home:  Schedule regular health, dental, and eye exams.  Stay current with your immunizations.  Do not use any tobacco products including cigarettes, chewing tobacco, or electronic cigarettes.  If you are pregnant, do not drink alcohol.  If you are breastfeeding, limit how much and how often you drink alcohol.  Limit alcohol intake to no more than 1 drink per day for nonpregnant women. One drink equals 12 ounces of beer, 5 ounces of wine, or 1 ounces of hard liquor.  Do not use street drugs.  Do not share needles.  Ask your health care provider for help if you need support or information about quitting drugs.  Tell your health care provider if you often feel depressed.  Tell your health care provider if you have ever been abused or do not feel safe at home. This information is not intended to replace advice given to you by your health care provider. Make sure you discuss any questions you have with your health care provider. Document Released: 07/12/2010 Document Revised: 06/04/2015 Document Reviewed: 09/30/2014 Elsevier Interactive Patient Education  2019 [employer]  
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
## 2018-06-26 ENCOUNTER — Encounter: Payer: Self-pay | Admitting: Internal Medicine

## 2018-06-26 ENCOUNTER — Other Ambulatory Visit (INDEPENDENT_AMBULATORY_CARE_PROVIDER_SITE_OTHER): Payer: PPO

## 2018-06-26 ENCOUNTER — Ambulatory Visit (INDEPENDENT_AMBULATORY_CARE_PROVIDER_SITE_OTHER): Payer: PPO | Admitting: Internal Medicine

## 2018-06-26 ENCOUNTER — Other Ambulatory Visit: Payer: Self-pay

## 2018-06-26 VITALS — BP 134/90 | HR 79 | Temp 98.2°F | Resp 16 | Ht 61.0 in | Wt 173.8 lb

## 2018-06-26 DIAGNOSIS — M85852 Other specified disorders of bone density and structure, left thigh: Secondary | ICD-10-CM | POA: Diagnosis not present

## 2018-06-26 DIAGNOSIS — Z0000 Encounter for general adult medical examination without abnormal findings: Secondary | ICD-10-CM | POA: Diagnosis not present

## 2018-06-26 DIAGNOSIS — Z23 Encounter for immunization: Secondary | ICD-10-CM

## 2018-06-26 DIAGNOSIS — K219 Gastro-esophageal reflux disease without esophagitis: Secondary | ICD-10-CM | POA: Diagnosis not present

## 2018-06-26 DIAGNOSIS — E785 Hyperlipidemia, unspecified: Secondary | ICD-10-CM

## 2018-06-26 DIAGNOSIS — Z6833 Body mass index (BMI) 33.0-33.9, adult: Secondary | ICD-10-CM | POA: Diagnosis not present

## 2018-06-26 DIAGNOSIS — B0229 Other postherpetic nervous system involvement: Secondary | ICD-10-CM | POA: Diagnosis not present

## 2018-06-26 DIAGNOSIS — M85851 Other specified disorders of bone density and structure, right thigh: Secondary | ICD-10-CM | POA: Diagnosis not present

## 2018-06-26 DIAGNOSIS — R739 Hyperglycemia, unspecified: Secondary | ICD-10-CM

## 2018-06-26 DIAGNOSIS — I10 Essential (primary) hypertension: Secondary | ICD-10-CM

## 2018-06-26 DIAGNOSIS — E6609 Other obesity due to excess calories: Secondary | ICD-10-CM

## 2018-06-26 LAB — LIPID PANEL
Cholesterol: 216 mg/dL — ABNORMAL HIGH (ref 0–200)
HDL: 42 mg/dL (ref >39.00)
LDL Cholesterol: 144 mg/dL — ABNORMAL HIGH (ref 0–99)
NonHDL: 174.26
Total CHOL/HDL Ratio: 5
Triglycerides: 149 mg/dL (ref 0.0–149.0)
VLDL: 29.8 mg/dL (ref 0.0–40.0)

## 2018-06-26 LAB — COMPREHENSIVE METABOLIC PANEL
ALT: 9 U/L (ref 0–35)
AST: 10 U/L (ref 0–37)
Albumin: 4.3 g/dL (ref 3.5–5.2)
Alkaline Phosphatase: 83 U/L (ref 39–117)
BUN: 6 mg/dL (ref 6–23)
CO2: 26 mEq/L (ref 19–32)
Calcium: 9.8 mg/dL (ref 8.4–10.5)
Chloride: 100 mEq/L (ref 96–112)
Creatinine, Ser: 0.63 mg/dL (ref 0.40–1.20)
GFR: 94.66 mL/min (ref >60.00)
Glucose, Bld: 91 mg/dL (ref 70–99)
Potassium: 4.2 mEq/L (ref 3.5–5.1)
Sodium: 134 mEq/L — ABNORMAL LOW (ref 135–145)
Total Bilirubin: 0.5 mg/dL (ref 0.2–1.2)
Total Protein: 7.4 g/dL (ref 6.0–8.3)

## 2018-06-26 LAB — CBC WITH DIFFERENTIAL/PLATELET
Basophils Absolute: 0 10*3/uL (ref 0.0–0.1)
Basophils Relative: 0.2 % (ref 0.0–3.0)
Eosinophils Absolute: 0.1 10*3/uL (ref 0.0–0.7)
Eosinophils Relative: 1.8 % (ref 0.0–5.0)
HCT: 44 % (ref 36.0–46.0)
Hemoglobin: 14.9 g/dL (ref 12.0–15.0)
Lymphocytes Relative: 35.8 % (ref 12.0–46.0)
Lymphs Abs: 2 10*3/uL (ref 0.7–4.0)
MCHC: 33.8 g/dL (ref 30.0–36.0)
MCV: 89.9 fl (ref 78.0–100.0)
Monocytes Absolute: 0.4 10*3/uL (ref 0.1–1.0)
Monocytes Relative: 8.1 % (ref 3.0–12.0)
Neutro Abs: 3 10*3/uL (ref 1.4–7.7)
Neutrophils Relative %: 54.1 % (ref 43.0–77.0)
Platelets: 380 10*3/uL (ref 150.0–400.0)
RBC: 4.89 Mil/uL (ref 3.87–5.11)
RDW: 12.8 % (ref 11.5–15.5)
WBC: 5.5 10*3/uL (ref 4.0–10.5)

## 2018-06-26 LAB — HEMOGLOBIN A1C: Hgb A1c MFr Bld: 5.4 % (ref 4.6–6.5)

## 2018-06-26 LAB — TSH: TSH: 1.19 u[IU]/mL (ref 0.35–4.50)

## 2018-06-26 MED ORDER — AMLODIPINE BESYLATE 5 MG PO TABS: 5.0000 mg | ORAL_TABLET | Freq: Every day | ORAL | 1 refills | Status: DC

## 2018-06-26 MED ORDER — FAMOTIDINE 10 MG PO TABS: 10.0000 mg | ORAL_TABLET | Freq: Two times a day (BID) | ORAL | Status: DC

## 2018-06-26 NOTE — Assessment & Plan Note (Signed)
Still having neuralgia - taking gabapentin prn continue

## 2018-06-26 NOTE — Assessment & Plan Note (Signed)
Check lipid panel  Regular exercise and healthy diet encouraged  

## 2018-06-26 NOTE — Assessment & Plan Note (Signed)
Encouraged regular exercies

## 2018-06-26 NOTE — Assessment & Plan Note (Signed)
dexa up to date Continue calcium and vitamin d Encouraged regular exercise

## 2018-06-26 NOTE — Assessment & Plan Note (Signed)
GERD controlled Continue daily medication  

## 2018-06-26 NOTE — Assessment & Plan Note (Signed)
Check a1c Low sugar / carb diet Stressed regular exercise   

## 2018-06-26 NOTE — Assessment & Plan Note (Signed)
BP well controlled Current regimen effective and well tolerated Continue current medications at current doses cmp  

## 2018-06-27 ENCOUNTER — Encounter: Payer: Self-pay | Admitting: Internal Medicine

## 2018-08-21 DIAGNOSIS — Z1231 Encounter for screening mammogram for malignant neoplasm of breast: Secondary | ICD-10-CM | POA: Diagnosis not present

## 2018-08-27 ENCOUNTER — Encounter: Payer: Self-pay | Admitting: Internal Medicine

## 2018-08-30 MED ORDER — PREGABALIN 50 MG PO CAPS: 50.0000 mg | ORAL_CAPSULE | Freq: Two times a day (BID) | ORAL | 0 refills | Status: DC

## 2018-09-13 DIAGNOSIS — H18812 Anesthesia and hypoesthesia of cornea, left eye: Secondary | ICD-10-CM | POA: Diagnosis not present

## 2018-09-13 DIAGNOSIS — H04123 Dry eye syndrome of bilateral lacrimal glands: Secondary | ICD-10-CM | POA: Diagnosis not present

## 2018-09-13 DIAGNOSIS — G510 Bell's palsy: Secondary | ICD-10-CM | POA: Diagnosis not present

## 2018-09-13 DIAGNOSIS — H16232 Neurotrophic keratoconjunctivitis, left eye: Secondary | ICD-10-CM | POA: Diagnosis not present

## 2018-09-22 ENCOUNTER — Encounter: Payer: Self-pay | Admitting: Internal Medicine

## 2018-09-22 DIAGNOSIS — H35033 Hypertensive retinopathy, bilateral: Secondary | ICD-10-CM | POA: Insufficient documentation

## 2018-10-07 ENCOUNTER — Encounter: Payer: Self-pay | Admitting: Internal Medicine

## 2018-10-08 MED ORDER — PREGABALIN 75 MG PO CAPS: 75.0000 mg | ORAL_CAPSULE | Freq: Two times a day (BID) | ORAL | 1 refills | Status: DC

## 2018-10-18 ENCOUNTER — Other Ambulatory Visit: Payer: Self-pay

## 2018-10-18 ENCOUNTER — Encounter: Payer: PPO | Admitting: Obstetrics & Gynecology

## 2018-10-18 ENCOUNTER — Ambulatory Visit (INDEPENDENT_AMBULATORY_CARE_PROVIDER_SITE_OTHER): Payer: PPO | Admitting: Obstetrics & Gynecology

## 2018-10-18 ENCOUNTER — Encounter: Payer: Self-pay | Admitting: Obstetrics & Gynecology

## 2018-10-18 VITALS — BP 124/80 | Ht 61.0 in | Wt 177.0 lb

## 2018-10-18 DIAGNOSIS — Z01419 Encounter for gynecological examination (general) (routine) without abnormal findings: Secondary | ICD-10-CM | POA: Diagnosis not present

## 2018-10-18 DIAGNOSIS — E6609 Other obesity due to excess calories: Secondary | ICD-10-CM | POA: Diagnosis not present

## 2018-10-18 DIAGNOSIS — Z780 Asymptomatic menopausal state: Secondary | ICD-10-CM | POA: Diagnosis not present

## 2018-10-18 DIAGNOSIS — Z6833 Body mass index (BMI) 33.0-33.9, adult: Secondary | ICD-10-CM | POA: Diagnosis not present

## 2018-10-18 DIAGNOSIS — M8589 Other specified disorders of bone density and structure, multiple sites: Secondary | ICD-10-CM

## 2018-10-18 DIAGNOSIS — M85852 Other specified disorders of bone density and structure, left thigh: Secondary | ICD-10-CM

## 2018-10-18 DIAGNOSIS — M85851 Other specified disorders of bone density and structure, right thigh: Secondary | ICD-10-CM

## 2018-10-18 NOTE — Progress Notes (Signed)
Colleen Walsh 1950/02/03 [id number]   History:    68 y.o. G2P2L2 Married  RP:  Established patient presenting for annual gyn exam   HPI: Postmenopause, well on no HRT.  No PMB.  No pelvic pain.  Abstinent.  Urine and bowel movements normal.  Breasts normal.  Body mass index 33.44.  Not very physically active.  Health labs with family physician.  Had shingles in the left ear with left Bell's palsy, recovering.  Past medical history,surgical history, family history and social history were all reviewed and documented in the EPIC chart.  Gynecologic History No LMP recorded. Patient is postmenopausal. Contraception: post menopausal status Last Pap: 08/2015. Results were: Negative Last mammogram: 08/2018. Results were: Negative Bone Density: 10/2017 Osteopenia Colonoscopy: 01/2017  Obstetric History OB History  Gravida Para Term Preterm AB Living  2 0       2  SAB TAB Ectopic Multiple Live Births               # Outcome Date GA Lbr Len/2nd Weight Sex Delivery Anes PTL Lv  2 Gravida           1 Gravida              ROS: A ROS was performed and pertinent positives and negatives are included in the history.  GENERAL: No fevers or chills. HEENT: No change in vision, no earache, sore throat or sinus congestion. NECK: No pain or stiffness. CARDIOVASCULAR: No chest pain or pressure. No palpitations. PULMONARY: No shortness of breath, cough or wheeze. GASTROINTESTINAL: No abdominal pain, nausea, vomiting or diarrhea, melena or bright red blood per rectum. GENITOURINARY: No urinary frequency, urgency, hesitancy or dysuria. MUSCULOSKELETAL: No joint or muscle pain, no back pain, no recent trauma. DERMATOLOGIC: No rash, no itching, no lesions. ENDOCRINE: No polyuria, polydipsia, no heat or cold intolerance. No recent change in weight. HEMATOLOGICAL: No anemia or easy bruising or bleeding. NEUROLOGIC: No headache, seizures, numbness, tingling or weakness. PSYCHIATRIC: No depression, no loss  of interest in normal activity or change in sleep pattern.     Exam:   BP 124/80 (BP Location: Right Arm, Patient Position: Sitting, Cuff Size: Normal)   Ht 5\' 1"  (1.549 m)   Wt 177 lb (80.3 kg)   BMI 33.44 kg/m   Body mass index is 33.44 kg/m.  General appearance : Well developed well nourished female. No acute distress HEENT: Eyes: no retinal hemorrhage or exudates,  Neck supple, trachea midline, no carotid bruits, no thyroidmegaly Lungs: Clear to auscultation, no rhonchi or wheezes, or rib retractions  Heart: Regular rate and rhythm, no murmurs or gallops Breast:Examined in sitting and supine position were symmetrical in appearance, no palpable masses or tenderness,  no skin retraction, no nipple inversion, no nipple discharge, no skin discoloration, no axillary or supraclavicular lymphadenopathy Abdomen: no palpable masses or tenderness, no rebound or guarding Extremities: no edema or skin discoloration or tenderness  Pelvic: Vulva: Normal             Vagina: No gross lesions or discharge  Cervix: No gross lesions or discharge  Uterus  AV, normal size, shape and consistency, non-tender and mobile  Adnexa  Without masses or tenderness  Anus: Normal   Assessment/Plan:  68 y.o. female for annual exam   1. Well female exam with routine gynecological exam Normal gynecologic exam in menopause.  Pap test in 2017 was negative, always had normal Pap test and is abstinent, no indication to repeat Pap tests.  Breast exam normal.  Screening mammogram August 2020 was negative.  Colonoscopy in January 2019.  Health labs with family physician.  Healing from left Bell's palsy secondary to left ear shingles.  2. Postmenopausal Well on no hormone replacement therapy.  No postmenopausal bleeding.  3. Osteopenia of necks of both femurs Vitamin D supplements, calcium intake of 1200 mg daily and regular weightbearing physical activity is recommended.  Repeat bone density in October 2021.  4.  Class 1 obesity due to excess calories without serious comorbidity with body mass index (BMI) of 33.0 to 33.9 in adult Recommend a lower calorie/carb diet such as [employer].  Aerobic physical activities 5 times a week and weightlifting every 2 days.  [name] MD, 2:07 PM 10/18/2018

## 2018-10-18 NOTE — Patient Instructions (Signed)
1. Well female exam with routine gynecological exam Normal gynecologic exam in menopause.  Pap test in 2017 was negative, always had normal Pap test and is abstinent, no indication to repeat Pap tests.  Breast exam normal.  Screening mammogram August 2020 was negative.  Colonoscopy in January 2019.  Health labs with family physician.  Healing from left Bell's palsy secondary to left ear shingles.  2. Postmenopausal Well on no hormone replacement therapy.  No postmenopausal bleeding.  3. Osteopenia of necks of both femurs Vitamin D supplements, calcium intake of 1200 mg daily and regular weightbearing physical activity is recommended.  Repeat bone density in October 2021.  4. Class 1 obesity due to excess calories without serious comorbidity with body mass index (BMI) of 33.0 to 33.9 in adult Recommend a lower calorie/carb diet such as [employer].  Aerobic physical activities 5 times a week and weightlifting every 2 days.  Colleen Walsh, it was a pleasure seeing you today!

## 2018-11-05 ENCOUNTER — Encounter: Payer: PPO | Admitting: Obstetrics & Gynecology

## 2018-12-28 ENCOUNTER — Encounter: Payer: Self-pay | Admitting: Internal Medicine

## 2019-03-07 ENCOUNTER — Ambulatory Visit: Payer: PPO | Attending: Internal Medicine

## 2019-03-07 DIAGNOSIS — Z23 Encounter for immunization: Secondary | ICD-10-CM | POA: Insufficient documentation

## 2019-03-07 NOTE — Progress Notes (Signed)
   Covid-19 Vaccination Clinic  Name:  Colleen Walsh    MRN: [id number] DOB: [birth year]  03/07/2019  [name] was observed post Covid-19 immunization for 15 minutes without incidence. She was provided with Vaccine Information Sheet and instruction to access the V-Safe system.   [name] was instructed to call 911 with any severe reactions post vaccine: Marland Kitchen Difficulty breathing  . Swelling of your face and throat  . A fast heartbeat  . A bad rash all over your body  . Dizziness and weakness    Immunizations Administered    Name Date Dose VIS Date Route   Pfizer COVID-19 Vaccine 03/07/2019 10:35 AM 0.3 mL 12/21/2018 Intramuscular   Manufacturer: [place]   Lot: [id number]   [name]: [id number]

## 2019-03-13 DIAGNOSIS — H16223 Keratoconjunctivitis sicca, not specified as Sjogren's, bilateral: Secondary | ICD-10-CM | POA: Diagnosis not present

## 2019-03-13 DIAGNOSIS — G510 Bell's palsy: Secondary | ICD-10-CM | POA: Diagnosis not present

## 2019-03-13 DIAGNOSIS — H35363 Drusen (degenerative) of macula, bilateral: Secondary | ICD-10-CM | POA: Diagnosis not present

## 2019-03-13 DIAGNOSIS — H04123 Dry eye syndrome of bilateral lacrimal glands: Secondary | ICD-10-CM | POA: Diagnosis not present

## 2019-03-27 ENCOUNTER — Ambulatory Visit: Payer: PPO | Attending: Internal Medicine

## 2019-03-27 DIAGNOSIS — Z23 Encounter for immunization: Secondary | ICD-10-CM

## 2019-03-27 NOTE — Progress Notes (Signed)
   Covid-19 Vaccination Clinic  Name:  Colleen Walsh    MRN: [id number] DOB: [birth year]  03/27/2019  [name] was observed post Covid-19 immunization for 15 minutes without incident. She was provided with Vaccine Information Sheet and instruction to access the V-Safe system.   [name] was instructed to call 911 with any severe reactions post vaccine: Marland Kitchen Difficulty breathing  . Swelling of face and throat  . A fast heartbeat  . A bad rash all over body  . Dizziness and weakness   Immunizations Administered    Name Date Dose VIS Date Route   Pfizer COVID-19 Vaccine 03/27/2019 12:33 PM 0.3 mL 12/21/2018 Intramuscular   Manufacturer: [place]   Lot: [id number]   [place]: [id number]

## 2019-05-31 ENCOUNTER — Encounter: Payer: Self-pay | Admitting: Internal Medicine

## 2019-05-31 ENCOUNTER — Other Ambulatory Visit: Payer: Self-pay | Admitting: Internal Medicine

## 2019-06-07 ENCOUNTER — Encounter: Payer: Self-pay | Admitting: Internal Medicine

## 2019-06-07 ENCOUNTER — Other Ambulatory Visit: Payer: Self-pay | Admitting: Internal Medicine

## 2019-06-07 MED ORDER — PREGABALIN 75 MG PO CAPS: 75.0000 mg | ORAL_CAPSULE | Freq: Two times a day (BID) | ORAL | 1 refills | Status: DC

## 2019-07-01 ENCOUNTER — Encounter: Payer: PPO | Admitting: Internal Medicine

## 2019-07-01 NOTE — Patient Instructions (Addendum)
Blood work was ordered.     Medications reviewed and updated.  Changes include :     Your prescription(s) have been submitted to your pharmacy. Please take as directed and contact our office if you believe you are having problem(s) with the medication(s).  A referral was ordered for        Someone will call you to schedule this.    Please followup in 1 year    Health Maintenance, Female Adopting a healthy lifestyle and getting preventive care are important in promoting health and wellness. Ask your health care provider about:  The right schedule for you to have regular tests and exams.  Things you can do on your own to prevent diseases and keep yourself healthy. What should I know about diet, weight, and exercise? Eat a healthy diet   Eat a diet that includes plenty of vegetables, fruits, low-fat dairy products, and lean protein.  Do not eat a lot of foods that are high in solid fats, added sugars, or sodium. Maintain a healthy weight Body mass index (BMI) is used to identify weight problems. It estimates body fat based on height and weight. Your health care provider can help determine your BMI and help you achieve or maintain a healthy weight. Get regular exercise Get regular exercise. This is one of the most important things you can do for your health. Most adults should:  Exercise for at least 150 minutes each week. The exercise should increase your heart rate and make you sweat (moderate-intensity exercise).  Do strengthening exercises at least twice a week. This is in addition to the moderate-intensity exercise.  Spend less time sitting. Even light physical activity can be beneficial. Watch cholesterol and blood lipids Have your blood tested for lipids and cholesterol at 67 years of age, then have this test every 5 years. Have your cholesterol levels checked more often if:  Your lipid or cholesterol levels are high.  You are older than 67 years of age.  You are at  high risk for heart disease. What should I know about cancer screening? Depending on your health history and family history, you may need to have cancer screening at various ages. This may include screening for:  Breast cancer.  Cervical cancer.  Colorectal cancer.  Skin cancer.  Lung cancer. What should I know about heart disease, diabetes, and high blood pressure? Blood pressure and heart disease  High blood pressure causes heart disease and increases the risk of stroke. This is more likely to develop in people who have high blood pressure readings, are of African descent, or are overweight.  Have your blood pressure checked: ? Every 3-5 years if you are 53-83 years of age. ? Every year if you are 74 years old or older. Diabetes Have regular diabetes screenings. This checks your fasting blood sugar level. Have the screening done:  Once every three years after age 11 if you are at a normal weight and have a low risk for diabetes.  More often and at a younger age if you are overweight or have a high risk for diabetes. What should I know about preventing infection? Hepatitis B If you have a higher risk for hepatitis B, you should be screened for this virus. Talk with your health care provider to find out if you are at risk for hepatitis B infection. Hepatitis C Testing is recommended for:  Everyone born from [birth year] through 1963.  Anyone with known risk factors for hepatitis C. Sexually transmitted infections (  STIs)  Get screened for STIs, including gonorrhea and chlamydia, if: ? You are sexually active and are younger than 67 years of age. ? You are older than 67 years of age and your health care provider tells you that you are at risk for this type of infection. ? Your sexual activity has changed since you were last screened, and you are at increased risk for chlamydia or gonorrhea. Ask your health care provider if you are at risk.  Ask your health care provider about whether  you are at high risk for HIV. Your health care provider may recommend a prescription medicine to help prevent HIV infection. If you choose to take medicine to prevent HIV, you should first get tested for HIV. You should then be tested every 3 months for as long as you are taking the medicine. Pregnancy  If you are about to stop having your period (premenopausal) and you may become pregnant, seek counseling before you get pregnant.  Take 400 to 800 micrograms (mcg) of folic acid every day if you become pregnant.  Ask for birth control (contraception) if you want to prevent pregnancy. Osteoporosis and menopause Osteoporosis is a disease in which the bones lose minerals and strength with aging. This can result in bone fractures. If you are 69 years old or older, or if you are at risk for osteoporosis and fractures, ask your health care provider if you should:  Be screened for bone loss.  Take a calcium or vitamin D supplement to lower your risk of fractures.  Be given hormone replacement therapy (HRT) to treat symptoms of menopause. Follow these instructions at home: Lifestyle  Do not use any products that contain nicotine or tobacco, such as cigarettes, e-cigarettes, and chewing tobacco. If you need help quitting, ask your health care provider.  Do not use street drugs.  Do not share needles.  Ask your health care provider for help if you need support or information about quitting drugs. Alcohol use  Do not drink alcohol if: ? Your health care provider tells you not to drink. ? You are pregnant, may be pregnant, or are planning to become pregnant.  If you drink alcohol: ? Limit how much you use to 0-1 drink a day. ? Limit intake if you are breastfeeding.  Be aware of how much alcohol is in your drink. In the [place] one drink equals one 12 oz bottle of beer (355 mL), one 5 oz glass of wine (148 mL), or one 1 oz glass of hard liquor (44 mL). General instructions  Schedule regular  health, dental, and eye exams.  Stay current with your vaccines.  Tell your health care provider if: ? You often feel depressed. ? You have ever been abused or do not feel safe at home. Summary  Adopting a healthy lifestyle and getting preventive care are important in promoting health and wellness.  Follow your health care provider's instructions about healthy diet, exercising, and getting tested or screened for diseases.  Follow your health care provider's instructions on monitoring your cholesterol and blood pressure. This information is not intended to replace advice given to you by your health care provider. Make sure you discuss any questions you have with your health care provider. Document Revised: 12/20/2017 Document Reviewed: 12/20/2017 Elsevier Patient Education  2020 [employer].
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
## 2019-07-02 ENCOUNTER — Encounter: Payer: PPO | Admitting: Internal Medicine

## 2019-07-16 NOTE — Patient Instructions (Addendum)
Blood work was ordered.    All other Health Maintenance issues reviewed.   All recommended immunizations and age-appropriate screenings are up-to-date or discussed.  No immunization administered today.   Medications reviewed and updated.  Changes include :   None-- try the Lyrica three times a day.   Your prescription(s) have been submitted to your pharmacy. Please take as directed and contact our office if you believe you are having problem(s) with the medication(s).  A referral was ordered for GI - Dr Loletha Carrow.     Someone will call you to schedule an appointment.    Please followup in 1 year    Health Maintenance, Female Adopting a healthy lifestyle and getting preventive care are important in promoting health and wellness. Ask your health care provider about:  The right schedule for you to have regular tests and exams.  Things you can do on your own to prevent diseases and keep yourself healthy. What should I know about diet, weight, and exercise? Eat a healthy diet   Eat a diet that includes plenty of vegetables, fruits, low-fat dairy products, and lean protein.  Do not eat a lot of foods that are high in solid fats, added sugars, or sodium. Maintain a healthy weight Body mass index (BMI) is used to identify weight problems. It estimates body fat based on height and weight. Your health care provider can help determine your BMI and help you achieve or maintain a healthy weight. Get regular exercise Get regular exercise. This is one of the most important things you can do for your health. Most adults should:  Exercise for at least 150 minutes each week. The exercise should increase your heart rate and make you sweat (moderate-intensity exercise).  Do strengthening exercises at least twice a week. This is in addition to the moderate-intensity exercise.  Spend less time sitting. Even light physical activity can be beneficial. Watch cholesterol and blood lipids Have your blood  tested for lipids and cholesterol at 67 years of age, then have this test every 5 years. Have your cholesterol levels checked more often if:  Your lipid or cholesterol levels are high.  You are older than 67 years of age.  You are at high risk for heart disease. What should I know about cancer screening? Depending on your health history and family history, you may need to have cancer screening at various ages. This may include screening for:  Breast cancer.  Cervical cancer.  Colorectal cancer.  Skin cancer.  Lung cancer. What should I know about heart disease, diabetes, and high blood pressure? Blood pressure and heart disease  High blood pressure causes heart disease and increases the risk of stroke. This is more likely to develop in people who have high blood pressure readings, are of African descent, or are overweight.  Have your blood pressure checked: ? Every 3-5 years if you are 15-40 years of age. ? Every year if you are 77 years old or older. Diabetes Have regular diabetes screenings. This checks your fasting blood sugar level. Have the screening done:  Once every three years after age 74 if you are at a normal weight and have a low risk for diabetes.  More often and at a younger age if you are overweight or have a high risk for diabetes. What should I know about preventing infection? Hepatitis B If you have a higher risk for hepatitis B, you should be screened for this virus. Talk with your health care provider to find out  if you are at risk for hepatitis B infection. Hepatitis C Testing is recommended for:  Everyone born from [birth year] through 1963.  Anyone with known risk factors for hepatitis C. Sexually transmitted infections (STIs)  Get screened for STIs, including gonorrhea and chlamydia, if: ? You are sexually active and are younger than 67 years of age. ? You are older than 67 years of age and your health care provider tells you that you are at risk for  this type of infection. ? Your sexual activity has changed since you were last screened, and you are at increased risk for chlamydia or gonorrhea. Ask your health care provider if you are at risk.  Ask your health care provider about whether you are at high risk for HIV. Your health care provider may recommend a prescription medicine to help prevent HIV infection. If you choose to take medicine to prevent HIV, you should first get tested for HIV. You should then be tested every 3 months for as long as you are taking the medicine. Pregnancy  If you are about to stop having your period (premenopausal) and you may become pregnant, seek counseling before you get pregnant.  Take 400 to 800 micrograms (mcg) of folic acid every day if you become pregnant.  Ask for birth control (contraception) if you want to prevent pregnancy. Osteoporosis and menopause Osteoporosis is a disease in which the bones lose minerals and strength with aging. This can result in bone fractures. If you are 37 years old or older, or if you are at risk for osteoporosis and fractures, ask your health care provider if you should:  Be screened for bone loss.  Take a calcium or vitamin D supplement to lower your risk of fractures.  Be given hormone replacement therapy (HRT) to treat symptoms of menopause. Follow these instructions at home: Lifestyle  Do not use any products that contain nicotine or tobacco, such as cigarettes, e-cigarettes, and chewing tobacco. If you need help quitting, ask your health care provider.  Do not use street drugs.  Do not share needles.  Ask your health care provider for help if you need support or information about quitting drugs. Alcohol use  Do not drink alcohol if: ? Your health care provider tells you not to drink. ? You are pregnant, may be pregnant, or are planning to become pregnant.  If you drink alcohol: ? Limit how much you use to 0-1 drink a day. ? Limit intake if you are  breastfeeding.  Be aware of how much alcohol is in your drink. In the [place] one drink equals one 12 oz bottle of beer (355 mL), one 5 oz glass of wine (148 mL), or one 1 oz glass of hard liquor (44 mL). General instructions  Schedule regular health, dental, and eye exams.  Stay current with your vaccines.  Tell your health care provider if: ? You often feel depressed. ? You have ever been abused or do not feel safe at home. Summary  Adopting a healthy lifestyle and getting preventive care are important in promoting health and wellness.  Follow your health care provider's instructions about healthy diet, exercising, and getting tested or screened for diseases.  Follow your health care provider's instructions on monitoring your cholesterol and blood pressure. This information is not intended to replace advice given to you by your health care provider. Make sure you discuss any questions you have with your health care provider. Document Revised: 12/20/2017 Document Reviewed: 12/20/2017 Elsevier Patient Education  [phone]  [employer].
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
## 2019-07-17 ENCOUNTER — Encounter: Payer: Self-pay | Admitting: Gastroenterology

## 2019-07-17 ENCOUNTER — Ambulatory Visit (INDEPENDENT_AMBULATORY_CARE_PROVIDER_SITE_OTHER): Payer: PPO | Admitting: Internal Medicine

## 2019-07-17 ENCOUNTER — Encounter: Payer: Self-pay | Admitting: Internal Medicine

## 2019-07-17 ENCOUNTER — Other Ambulatory Visit: Payer: Self-pay

## 2019-07-17 VITALS — BP 118/74 | HR 80 | Temp 98.0°F | Ht 61.0 in

## 2019-07-17 DIAGNOSIS — I10 Essential (primary) hypertension: Secondary | ICD-10-CM | POA: Diagnosis not present

## 2019-07-17 DIAGNOSIS — Z6833 Body mass index (BMI) 33.0-33.9, adult: Secondary | ICD-10-CM

## 2019-07-17 DIAGNOSIS — Z1211 Encounter for screening for malignant neoplasm of colon: Secondary | ICD-10-CM | POA: Diagnosis not present

## 2019-07-17 DIAGNOSIS — E785 Hyperlipidemia, unspecified: Secondary | ICD-10-CM | POA: Diagnosis not present

## 2019-07-17 DIAGNOSIS — B0229 Other postherpetic nervous system involvement: Secondary | ICD-10-CM

## 2019-07-17 DIAGNOSIS — Z0000 Encounter for general adult medical examination without abnormal findings: Secondary | ICD-10-CM

## 2019-07-17 DIAGNOSIS — R739 Hyperglycemia, unspecified: Secondary | ICD-10-CM

## 2019-07-17 DIAGNOSIS — M85851 Other specified disorders of bone density and structure, right thigh: Secondary | ICD-10-CM | POA: Diagnosis not present

## 2019-07-17 DIAGNOSIS — E6609 Other obesity due to excess calories: Secondary | ICD-10-CM | POA: Diagnosis not present

## 2019-07-17 DIAGNOSIS — M85852 Other specified disorders of bone density and structure, left thigh: Secondary | ICD-10-CM | POA: Diagnosis not present

## 2019-07-17 LAB — CBC WITH DIFFERENTIAL/PLATELET
Basophils Absolute: 0.2 10*3/uL — ABNORMAL HIGH (ref 0.0–0.1)
Basophils Relative: 2.6 % (ref 0.0–3.0)
Eosinophils Absolute: 0.1 10*3/uL (ref 0.0–0.7)
Eosinophils Relative: 1.5 % (ref 0.0–5.0)
HCT: 40.5 % (ref 36.0–46.0)
Hemoglobin: 14 g/dL (ref 12.0–15.0)
Lymphocytes Relative: 38.2 % (ref 12.0–46.0)
Lymphs Abs: 2.5 10*3/uL (ref 0.7–4.0)
MCHC: 34.7 g/dL (ref 30.0–36.0)
MCV: 89.9 fl (ref 78.0–100.0)
Monocytes Absolute: 0.5 10*3/uL (ref 0.1–1.0)
Monocytes Relative: 7.4 % (ref 3.0–12.0)
Neutro Abs: 3.3 10*3/uL (ref 1.4–7.7)
Neutrophils Relative %: 50.3 % (ref 43.0–77.0)
Platelets: 390 10*3/uL (ref 150.0–400.0)
RBC: 4.5 Mil/uL (ref 3.87–5.11)
RDW: 13.2 % (ref 11.5–15.5)
WBC: 6.7 10*3/uL (ref 4.0–10.5)

## 2019-07-17 LAB — COMPREHENSIVE METABOLIC PANEL
ALT: 12 U/L (ref 0–35)
AST: 15 U/L (ref 0–37)
Albumin: 4.3 g/dL (ref 3.5–5.2)
Alkaline Phosphatase: 95 U/L (ref 39–117)
BUN: 7 mg/dL (ref 6–23)
CO2: 27 mEq/L (ref 19–32)
Calcium: 9.9 mg/dL (ref 8.4–10.5)
Chloride: 100 mEq/L (ref 96–112)
Creatinine, Ser: 0.66 mg/dL (ref 0.40–1.20)
GFR: 89.42 mL/min (ref >60.00)
Glucose, Bld: 94 mg/dL (ref 70–99)
Potassium: 4.1 mEq/L (ref 3.5–5.1)
Sodium: 134 mEq/L — ABNORMAL LOW (ref 135–145)
Total Bilirubin: 0.4 mg/dL (ref 0.2–1.2)
Total Protein: 7.4 g/dL (ref 6.0–8.3)

## 2019-07-17 LAB — LIPID PANEL
Cholesterol: 233 mg/dL — ABNORMAL HIGH (ref 0–200)
HDL: 36 mg/dL — ABNORMAL LOW (ref >39.00)
NonHDL: 197.26
Total CHOL/HDL Ratio: 6
Triglycerides: 374 mg/dL — ABNORMAL HIGH (ref 0.0–149.0)
VLDL: 74.8 mg/dL — ABNORMAL HIGH (ref 0.0–40.0)

## 2019-07-17 LAB — LDL CHOLESTEROL, DIRECT: Direct LDL: 122 mg/dL

## 2019-07-17 LAB — TSH: TSH: 1.03 u[IU]/mL (ref 0.35–4.50)

## 2019-07-17 LAB — HEMOGLOBIN A1C: Hgb A1c MFr Bld: 5.2 % (ref 4.6–6.5)

## 2019-07-17 MED ORDER — PREGABALIN 75 MG PO CAPS: 75.0000 mg | ORAL_CAPSULE | Freq: Three times a day (TID) | ORAL | 1 refills | Status: DC

## 2019-07-17 NOTE — Assessment & Plan Note (Signed)
a1c

## 2019-07-17 NOTE — Assessment & Plan Note (Addendum)
Chronic Taking lyrica twice daily and it does help Continue - discussed that we can increase the dose to TID or increase dose -- will try TID and she will let me know

## 2019-07-17 NOTE — Assessment & Plan Note (Signed)
Chronic dexa up to date Taking vitamin d and calcium Encouraged regular exercise

## 2019-07-17 NOTE — Assessment & Plan Note (Signed)
Chronic °Check lipid panel   °Regular exercise and healthy diet encouraged ° °

## 2019-07-17 NOTE — Assessment & Plan Note (Signed)
Discussed importance of weight loss ?Stressed regular exercise - goal 30 minutes 5 days a week ?Discussed decreasing portions, decreasing sugars/carbs ?Increase veges, lean protin ? ? ?

## 2019-07-17 NOTE — Assessment & Plan Note (Signed)
Chronic BP well controlled Current regimen effective and well tolerated Continue current medications at current doses cmp  

## 2019-07-29 DIAGNOSIS — Z1283 Encounter for screening for malignant neoplasm of skin: Secondary | ICD-10-CM | POA: Diagnosis not present

## 2019-07-29 DIAGNOSIS — L304 Erythema intertrigo: Secondary | ICD-10-CM | POA: Diagnosis not present

## 2019-07-29 DIAGNOSIS — L821 Other seborrheic keratosis: Secondary | ICD-10-CM | POA: Diagnosis not present

## 2019-08-22 DIAGNOSIS — Z1231 Encounter for screening mammogram for malignant neoplasm of breast: Secondary | ICD-10-CM | POA: Diagnosis not present

## 2019-08-22 LAB — HM MAMMOGRAPHY

## 2019-08-28 ENCOUNTER — Encounter: Payer: Self-pay | Admitting: Gastroenterology

## 2019-08-28 ENCOUNTER — Other Ambulatory Visit: Payer: Self-pay | Admitting: Gastroenterology

## 2019-08-28 ENCOUNTER — Ambulatory Visit (AMBULATORY_SURGERY_CENTER): Payer: Self-pay | Admitting: *Deleted

## 2019-08-28 ENCOUNTER — Other Ambulatory Visit: Payer: Self-pay

## 2019-08-28 VITALS — Ht 61.0 in | Wt 185.0 lb

## 2019-08-28 DIAGNOSIS — Z800 Family history of malignant neoplasm of digestive organs: Secondary | ICD-10-CM

## 2019-08-28 MED ORDER — PLENVU 140 G PO SOLR: 1.0000 | Freq: Once | ORAL | 0 refills | Status: AC

## 2019-08-28 NOTE — Progress Notes (Signed)
Patient is here in-person for PV. Patient denies any allergies to eggs or soy. Patient denies any problems with anesthesia/sedation. Patient denies any oxygen use at home. Patient denies taking any diet/weight loss medications or blood thinners. Patient is not being treated for MRSA or C-diff. Patient is aware of our care-partner policy and INOMV-67 safety protocol. COVID-vaccines completed 03/27/19. Prep Prescription coupon given to the patient.
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
## 2019-08-30 ENCOUNTER — Ambulatory Visit (INDEPENDENT_AMBULATORY_CARE_PROVIDER_SITE_OTHER): Payer: PPO | Admitting: Internal Medicine

## 2019-08-30 ENCOUNTER — Other Ambulatory Visit: Payer: Self-pay

## 2019-08-30 ENCOUNTER — Encounter: Payer: Self-pay | Admitting: Internal Medicine

## 2019-08-30 DIAGNOSIS — M5489 Other dorsalgia: Secondary | ICD-10-CM | POA: Diagnosis not present

## 2019-08-30 MED ORDER — DICLOFENAC SODIUM 75 MG PO TBEC
75.0000 mg | DELAYED_RELEASE_TABLET | Freq: Two times a day (BID) | ORAL | 0 refills | Status: DC
Start: 2019-08-30 — End: 2020-04-27

## 2019-08-30 MED ORDER — CYCLOBENZAPRINE HCL 10 MG PO TABS: 5.0000 mg | ORAL_TABLET | Freq: Three times a day (TID) | ORAL | 1 refills | Status: DC | PRN

## 2019-08-30 NOTE — Patient Instructions (Addendum)
Use ice and / or heat.   Apply topical medication like Biofreeze.   Do gentle stretching.   Take the flexeril up to three times a day as needed.    Take the diclofenac twice daily with food.  Do not take any advil/aleve or aspirin while taking this.  If you have stomach upset stop the medication.    Please call if there is no improvement in your symptoms.

## 2019-08-30 NOTE — Assessment & Plan Note (Signed)
Acute Seems to be a combination of psoas strain and left SI joint pain Deferred steroids Flexeril 5-10 mg up to 3 times daily as needed Diclofenac 75 mg twice daily with food-advised this could cause stomach upset and if it does she should stop it.  No Advil, Aleve or aspirin while taking this Advised heat, ice and topical medication such as Biofreeze Advised stretching gently, avoiding bending, lifting and twisting movements Call if no improvement

## 2019-09-12 ENCOUNTER — Ambulatory Visit (AMBULATORY_SURGERY_CENTER): Payer: PPO | Admitting: Gastroenterology

## 2019-09-12 ENCOUNTER — Encounter: Payer: Self-pay | Admitting: Gastroenterology

## 2019-09-12 ENCOUNTER — Other Ambulatory Visit: Payer: Self-pay

## 2019-09-12 VITALS — BP 92/68 | HR 68 | Temp 97.3°F | Resp 13 | Ht 61.0 in | Wt 185.0 lb

## 2019-09-12 DIAGNOSIS — Z1211 Encounter for screening for malignant neoplasm of colon: Secondary | ICD-10-CM | POA: Diagnosis not present

## 2019-09-12 DIAGNOSIS — Z800 Family history of malignant neoplasm of digestive organs: Secondary | ICD-10-CM

## 2019-09-12 MED ORDER — SODIUM CHLORIDE 0.9 % IV SOLN: 500.0000 mL | Freq: Once | INTRAVENOUS | Status: DC

## 2019-09-12 NOTE — Op Note (Signed)
[facility] Patient Name: Colleen Walsh Procedure Date: 09/12/2019 9:15 AM MRN: [id number] Endoscopist: [place]. Loletha Carrow , MD Age: 69 Referring MD:  Date of Birth: 1950-10-22 Gender: Female Account #: [id number] Procedure:                Colonoscopy Indications:              Screening patient at increased risk: Family history                            of colorectal cancer in multiple 1st-degree                            relatives Medicines:                Monitored Anesthesia Care Procedure:                Pre-Anesthesia Assessment:                           - Prior to the procedure, a History and Physical                            was performed, and patient medications and                            allergies were reviewed. The patient's tolerance of                            previous anesthesia was also reviewed. The risks                            and benefits of the procedure and the sedation                            options and risks were discussed with the patient.                            All questions were answered, and informed consent                            was obtained. Prior Anticoagulants: The patient has                            taken no previous anticoagulant or antiplatelet                            agents. ASA Grade Assessment: II - A patient with                            mild systemic disease. After reviewing the risks                            and benefits, the patient was deemed in  satisfactory condition to undergo the procedure.                           After obtaining informed consent, the colonoscope                            was passed under direct vision. Throughout the                            procedure, the patient's blood pressure, pulse, and                            oxygen saturations were monitored continuously. The                            Colonoscope was introduced through the anus and                             advanced to the the cecum, identified by                            appendiceal orifice and ileocecal valve. The                            colonoscopy was performed without difficulty. The                            patient tolerated the procedure well. The quality                            of the bowel preparation was good. The ileocecal                            valve, appendiceal orifice, and rectum were                            photographed. The bowel preparation used was Plenvu. Scope In: 9:26:16 AM Scope Out: 9:38:40 AM Scope Withdrawal Time: 0 hours 9 minutes 4 seconds  Total Procedure Duration: 0 hours 12 minutes 24 seconds  Findings:                 The perianal and digital rectal examinations were                            normal.                           Multiple diverticula were found in the left colon.                           The exam was otherwise without abnormality on                            direct and retroflexion views. Complications:  No immediate complications. Estimated Blood Loss:     Estimated blood loss: none. Impression:               - Diverticulosis in the left colon.                           - The examination was otherwise normal on direct                            and retroflexion views.                           - No specimens collected. Recommendation:           - Patient has a contact number available for                            emergencies. The signs and symptoms of potential                            delayed complications were discussed with the                            patient. Return to normal activities tomorrow.                            Written discharge instructions were provided to the                            patient.                           - Resume previous diet.                           - Continue present medications.                           - Repeat colonoscopy in 5 years for  screening                            purposes. Lonisha Bobby L. Loletha Carrow, MD 09/12/2019 9:42:12 AM This report has been signed electronically.

## 2019-09-12 NOTE — Progress Notes (Signed)
No problems noted in the recovery room. maw 

## 2019-09-12 NOTE — Patient Instructions (Signed)
Handout was given to you on diverticulosis. You may resume your current medications today. Repeat colonoscopy in 5 years for screening purposes. Please call if any questions or concerns.     YOU HAD AN ENDOSCOPIC PROCEDURE TODAY AT [facility]:   Refer to the procedure report that was given to you for any specific questions about what was found during the examination.  If the procedure report does not answer your questions, please call your gastroenterologist to clarify.  If you requested that your care partner not be given the details of your procedure findings, then the procedure report has been included in a sealed envelope for you to review at your convenience later.  YOU SHOULD EXPECT: Some feelings of bloating in the abdomen. Passage of more gas than usual.  Walking can help get rid of the air that was put into your GI tract during the procedure and reduce the bloating. If you had a lower endoscopy (such as a colonoscopy or flexible sigmoidoscopy) you may notice spotting of blood in your stool or on the toilet paper. If you underwent a bowel prep for your procedure, you may not have a normal bowel movement for a few days.  Please Note:  You might notice some irritation and congestion in your nose or some drainage.  This is from the oxygen used during your procedure.  There is no need for concern and it should clear up in a day or so.  SYMPTOMS TO REPORT IMMEDIATELY:   Following lower endoscopy (colonoscopy or flexible sigmoidoscopy):  Excessive amounts of blood in the stool  Significant tenderness or worsening of abdominal pains  Swelling of the abdomen that is new, acute  Fever of 100F or higher   For urgent or emergent issues, a gastroenterologist can be reached at any hour by calling [phone]. Do not use MyChart messaging for urgent concerns.    DIET:  We do recommend a small meal at first, but then you may proceed to your regular diet.  Drink plenty of  fluids but you should avoid alcoholic beverages for 24 hours.  ACTIVITY:  You should plan to take it easy for the rest of today and you should NOT DRIVE or use heavy machinery until tomorrow (because of the sedation medicines used during the test).    FOLLOW UP: Our staff will call the number listed on your records 48-72 hours following your procedure to check on you and address any questions or concerns that you may have regarding the information given to you following your procedure. If we do not reach you, we will leave a message.  We will attempt to reach you two times.  During this call, we will ask if you have developed any symptoms of COVID 19. If you develop any symptoms (ie: fever, flu-like symptoms, shortness of breath, cough etc.) before then, please call [phone].  If you test positive for Covid 19 in the 2 weeks post procedure, please call and report this information to [place].    If any biopsies were taken you will be contacted by phone or by letter within the next 1-3 weeks.  Please call us at [phone] if you have not heard about the biopsies in 3 weeks.    SIGNATURES/CONFIDENTIALITY: You and/or your care partner have signed paperwork which will be entered into your electronic medical record.  These signatures attest to the fact that that the information above on your After Visit Summary has been reviewed and is understood.  Full responsibility of the confidentiality of this discharge information lies with you and/or your care-partner.

## 2019-09-12 NOTE — Progress Notes (Signed)
VS-CW  Pt's states no medical or surgical changes since previsit or office visit.  

## 2019-09-12 NOTE — Progress Notes (Signed)
To PACU, VSS. Report to Rn.tb 

## 2019-09-17 ENCOUNTER — Telehealth: Payer: Self-pay | Admitting: *Deleted

## 2019-09-17 NOTE — Telephone Encounter (Signed)
°  Follow up Call-  Call back number 09/12/2019 01/16/2017  Post procedure Call Back phone  # [phone]  Permission to leave phone message Yes Yes  Some recent data might be hidden    LMOM to call back with any questions or concerns.  Also, call back if patient has developed fever, respiratory issues or been dx with COVID or had any family members or close contacts diagnosed since her procedure.

## 2019-09-17 NOTE — Telephone Encounter (Signed)
First follow up call attempt.  Message left on voicemail.

## 2019-09-18 DIAGNOSIS — H04123 Dry eye syndrome of bilateral lacrimal glands: Secondary | ICD-10-CM | POA: Diagnosis not present

## 2019-09-18 DIAGNOSIS — H40013 Open angle with borderline findings, low risk, bilateral: Secondary | ICD-10-CM | POA: Diagnosis not present

## 2019-09-18 DIAGNOSIS — H40033 Anatomical narrow angle, bilateral: Secondary | ICD-10-CM | POA: Diagnosis not present

## 2019-09-23 ENCOUNTER — Encounter: Payer: Self-pay | Admitting: Internal Medicine

## 2019-09-24 MED ORDER — AMLODIPINE BESYLATE 5 MG PO TABS
5.0000 mg | ORAL_TABLET | Freq: Every day | ORAL | 1 refills | Status: DC
Start: 2019-09-24 — End: 2020-05-06

## 2019-10-02 ENCOUNTER — Encounter: Payer: Self-pay | Admitting: Internal Medicine

## 2019-10-02 NOTE — Progress Notes (Signed)
Outside notes received. Information abstracted. Notes sent to scan.  

## 2019-10-22 ENCOUNTER — Encounter: Payer: PPO | Admitting: Obstetrics & Gynecology

## 2020-01-01 ENCOUNTER — Encounter: Payer: Self-pay | Admitting: Internal Medicine

## 2020-01-07 ENCOUNTER — Telehealth: Payer: Self-pay

## 2020-01-07 NOTE — Telephone Encounter (Signed)
acqueline Durnin (Key: [id number])  [employer] has received your information, and the request will be reviewed. You may close this dialog, return to your dashboard, and perform other tasks.  You will receive an electronic determination in CoverMyMeds. You can see the latest determination by locating this request on your dashboard or by reopening this request. You will also receive a faxed copy of the determination. If you have any questions please contact [employer] at [phone].  If you need assistance, please chat with CoverMyMeds or call us at [phone].

## 2020-01-21 ENCOUNTER — Encounter: Payer: Self-pay | Admitting: Obstetrics & Gynecology

## 2020-01-28 ENCOUNTER — Ambulatory Visit: Payer: PPO | Admitting: Obstetrics & Gynecology

## 2020-02-06 ENCOUNTER — Encounter: Payer: Self-pay | Admitting: Internal Medicine

## 2020-02-06 MED ORDER — PREGABALIN 100 MG PO CAPS: 100.0000 mg | ORAL_CAPSULE | Freq: Three times a day (TID) | ORAL | 0 refills | Status: DC

## 2020-03-04 ENCOUNTER — Ambulatory Visit: Payer: PPO | Admitting: Obstetrics & Gynecology

## 2020-03-04 ENCOUNTER — Telehealth: Payer: Self-pay | Admitting: Internal Medicine

## 2020-03-04 ENCOUNTER — Other Ambulatory Visit: Payer: Self-pay

## 2020-03-04 ENCOUNTER — Encounter: Payer: Self-pay | Admitting: Obstetrics & Gynecology

## 2020-03-04 VITALS — BP 110/74

## 2020-03-04 DIAGNOSIS — Z780 Asymptomatic menopausal state: Secondary | ICD-10-CM

## 2020-03-04 DIAGNOSIS — M85852 Other specified disorders of bone density and structure, left thigh: Secondary | ICD-10-CM

## 2020-03-04 DIAGNOSIS — M85851 Other specified disorders of bone density and structure, right thigh: Secondary | ICD-10-CM

## 2020-03-04 DIAGNOSIS — Z01419 Encounter for gynecological examination (general) (routine) without abnormal findings: Secondary | ICD-10-CM | POA: Diagnosis not present

## 2020-03-04 NOTE — Progress Notes (Signed)
Colleen Walsh [birth year] [id number]   History:    70 y.o. G2P2L2 Married  RP:  Established patient presenting for annual gyn exam   HPI: Postmenopause, well on no HRT.  No PMB.  No pelvic pain.  C/O lump at the left posterior vulva/perineum, not painful/tender, no drainage.  Abstinent.  Pap Neg in 2017.  Urine and bowel movements normal.  Breasts normal.  Screening mammo Neg 08/2019.  Body mass index 33.44 last year, refused wt/ht this year.  Not very physically active.  Health labs with family physician.  Had shingles in the left ear with left Bell's palsy under treatment for persisting pain.  Colono 09/2019.  BD Osteopenia 10/2017.    Past medical history,surgical history, family history and social history were all reviewed and documented in the EPIC chart.  Gynecologic History No LMP recorded. Patient is postmenopausal.  Obstetric History OB History  Gravida Para Term Preterm AB Living  2 0     0 2  SAB IAB Ectopic Multiple Live Births  0   0        # Outcome Date GA Lbr Len/2nd Weight Sex Delivery Anes PTL Lv  2 Gravida           1 Gravida              ROS: A ROS was performed and pertinent positives and negatives are included in the history.  GENERAL: No fevers or chills. HEENT: No change in vision, no earache, sore throat or sinus congestion. NECK: No pain or stiffness. CARDIOVASCULAR: No chest pain or pressure. No palpitations. PULMONARY: No shortness of breath, cough or wheeze. GASTROINTESTINAL: No abdominal pain, nausea, vomiting or diarrhea, melena or bright red blood per rectum. GENITOURINARY: No urinary frequency, urgency, hesitancy or dysuria. MUSCULOSKELETAL: No joint or muscle pain, no back pain, no recent trauma. DERMATOLOGIC: No rash, no itching, no lesions. ENDOCRINE: No polyuria, polydipsia, no heat or cold intolerance. No recent change in weight. HEMATOLOGICAL: No anemia or easy bruising or bleeding. NEUROLOGIC: No headache, seizures, numbness, tingling or  weakness. PSYCHIATRIC: No depression, no loss of interest in normal activity or change in sleep pattern.     Exam:   BP 110/74 (BP Location: Right Arm, Patient Position: Sitting, Cuff Size: Normal)   There is no height or weight on file to calculate BMI.  General appearance : Well developed well nourished female. No acute distress HEENT: Eyes: no retinal hemorrhage or exudates,  Neck supple, trachea midline, no carotid bruits, no thyroidmegaly Lungs: Clear to auscultation, no rhonchi or wheezes, or rib retractions  Heart: Regular rate and rhythm, no murmurs or gallops Breast:Examined in sitting and supine position were symmetrical in appearance, no palpable masses or tenderness,  no skin retraction, no nipple inversion, no nipple discharge, no skin discoloration, no axillary or supraclavicular lymphadenopathy Abdomen: no palpable masses or tenderness, no rebound or guarding Extremities: no edema or skin discoloration or tenderness  Pelvic: Vulva: Normal.  Left posterior vulva/perineum with small nodule, no erythema or tenderness.             Vagina: No gross lesions or discharge  Cervix: No gross lesions or discharge  Uterus  AV, normal size, shape and consistency, non-tender and mobile  Adnexa  Without masses or tenderness  Anus: Normal   Assessment/Plan:  70 y.o. female for annual exam   1. Well female exam with routine gynecological exam Normal gynecologic exam in menopause.  Small area of scarring at the left  posterior vulva, probably secondary to previous sebaceous's gland infection, no sign of active infection. No indication for a Pap test at this time.  Breast exam normal.  Screening mammogram August 2021 was negative.  Colonoscopy September 2021.  Declined weight and height measurements.  Health labs with family physician.  2. Postmenopausal Well on no hormone replacement therapy.  No postmenopausal bleeding.  3. Osteopenia of necks of both femurs Osteopenia due for a  repeat bone density, will schedule here.  Vitamin D supplements, calcium intake of 1.5 g/day and regular weightbearing physical activities. - DG Bone Density; Future  [name] MD, 1:57 PM 03/04/2020

## 2020-03-04 NOTE — Progress Notes (Signed)
  Chronic Care Management   Note  03/04/2020 Name: [name] MRN: [id number] DOB: [birth year]  Colleen Walsh is a 70 y.o. year old female who is a primary care patient of Burns, Claudina Lick, MD. I reached out to [employer] by phone today in response to a referral sent by Colleen Walsh's PCP, Binnie Rail, MD.   Colleen Walsh was given information about Chronic Care Management services today including:  1. CCM service includes personalized support from designated clinical staff supervised by her physician, including individualized plan of care and coordination with other care providers 2. 24/7 contact phone numbers for assistance for urgent and routine care needs. 3. Service will only be billed when office clinical staff spend 20 minutes or more in a month to coordinate care. 4. Only one practitioner may furnish and bill the service in a calendar month. 5. The patient may stop CCM services at any time (effective at the end of the month) by phone call to the office staff.   Patient agreed to services and verbal consent obtained.   Follow up plan:   Carley Perdue [employer] Scheduler

## 2020-03-17 DIAGNOSIS — R7309 Other abnormal glucose: Secondary | ICD-10-CM | POA: Diagnosis not present

## 2020-03-17 DIAGNOSIS — H40013 Open angle with borderline findings, low risk, bilateral: Secondary | ICD-10-CM | POA: Diagnosis not present

## 2020-03-17 DIAGNOSIS — H40033 Anatomical narrow angle, bilateral: Secondary | ICD-10-CM | POA: Diagnosis not present

## 2020-03-17 DIAGNOSIS — H524 Presbyopia: Secondary | ICD-10-CM | POA: Diagnosis not present

## 2020-03-17 DIAGNOSIS — H04123 Dry eye syndrome of bilateral lacrimal glands: Secondary | ICD-10-CM | POA: Diagnosis not present

## 2020-03-17 LAB — HM DIABETES EYE EXAM

## 2020-03-29 ENCOUNTER — Encounter: Payer: Self-pay | Admitting: Internal Medicine

## 2020-03-31 MED ORDER — DULOXETINE HCL 20 MG PO CPEP: 20.0000 mg | ORAL_CAPSULE | Freq: Every day | ORAL | 5 refills | Status: DC

## 2020-03-31 NOTE — Addendum Note (Signed)
Addended by: Binnie Rail on: 03/31/2020 05:15 AM   Modules accepted: Orders

## 2020-04-01 MED ORDER — PREGABALIN 100 MG PO CAPS
100.0000 mg | ORAL_CAPSULE | Freq: Three times a day (TID) | ORAL | 0 refills | Status: DC
Start: 2020-04-01 — End: 2020-05-01

## 2020-04-01 NOTE — Addendum Note (Signed)
Addended by: Binnie Rail on: 04/01/2020 09:04 PM   Modules accepted: Orders

## 2020-04-21 ENCOUNTER — Other Ambulatory Visit: Payer: Self-pay

## 2020-04-21 ENCOUNTER — Ambulatory Visit (INDEPENDENT_AMBULATORY_CARE_PROVIDER_SITE_OTHER): Payer: PPO

## 2020-04-21 ENCOUNTER — Other Ambulatory Visit: Payer: Self-pay | Admitting: Obstetrics & Gynecology

## 2020-04-21 DIAGNOSIS — M85851 Other specified disorders of bone density and structure, right thigh: Secondary | ICD-10-CM

## 2020-04-21 DIAGNOSIS — Z780 Asymptomatic menopausal state: Secondary | ICD-10-CM

## 2020-04-21 DIAGNOSIS — M8589 Other specified disorders of bone density and structure, multiple sites: Secondary | ICD-10-CM | POA: Diagnosis not present

## 2020-04-24 ENCOUNTER — Telehealth: Payer: Self-pay

## 2020-04-24 NOTE — Progress Notes (Signed)
    Chronic Care Management Pharmacy Assistant   Name: DARNEISHA WINDHORST  MRN: [id number] DOB: 31-Jan-1950  Reason for Encounter: Initial Questions Appointment: [facility] 04/27/20 @ 2 pm   Recent office visits:  None noted  Recent consult visits:  03/04/20 Ronie Spies, [place] Hospital visits:  None in previous 6 months  Medications: Outpatient Encounter Medications as of 04/24/2020  Medication Sig  . amLODipine (NORVASC) 5 MG tablet Take 1 tablet (5 mg total) by mouth daily.  . Ascorbic Acid (VITAMIN C) 1000 MG tablet Take 1,000 mg by mouth daily.  . Calcium-Vitamin D (CALTRATE 600 PLUS-VIT D PO) Take 1 each by mouth daily.  . cyclobenzaprine (FLEXERIL) 10 MG tablet Take 0.5-1 tablets (5-10 mg total) by mouth 3 (three) times daily as needed for muscle spasms.  . diclofenac (VOLTAREN) 75 MG EC tablet Take 1 tablet (75 mg total) by mouth 2 (two) times daily. Take with food  . DULoxetine (CYMBALTA) 20 MG capsule Take 1 capsule (20 mg total) by mouth daily.  . Multiple Vitamins-Minerals (MULTIVITAMIN,TX-MINERALS) tablet Take 1 tablet by mouth daily.  . pregabalin (LYRICA) 100 MG capsule Take 1 capsule (100 mg total) by mouth 3 (three) times daily.   No facility-administered encounter medications on file as of 04/24/2020.    Have you seen any other providers since your last visit?  Any changes in your medications or health?  Any side effects from any medications?  Do you have an symptoms or problems not managed by your medications?  Any concerns about your health right now?  Has your provider asked that you check blood pressure, blood sugar, or follow special diet at home?   Do you get any type of exercise on a regular basis?   Can you think of a goal you would like to reach for your health?   Do you have any problems getting your medications?   Is there anything that you would like to discuss during the appointment?   Please bring medications and supplements to  appointment   Star Rating Drugs: N/A   Orinda Kenner, RMA Clinical Pharmacists Assistant [phone]  Unable to reach patient before appointment with CPP. Time Spent: 30

## 2020-04-27 ENCOUNTER — Ambulatory Visit (INDEPENDENT_AMBULATORY_CARE_PROVIDER_SITE_OTHER): Payer: PPO | Admitting: Pharmacist

## 2020-04-27 ENCOUNTER — Other Ambulatory Visit: Payer: Self-pay

## 2020-04-27 ENCOUNTER — Ambulatory Visit (INDEPENDENT_AMBULATORY_CARE_PROVIDER_SITE_OTHER): Payer: PPO

## 2020-04-27 VITALS — BP 110/60 | HR 77 | Temp 97.6°F | Ht 61.0 in | Wt 187.4 lb

## 2020-04-27 DIAGNOSIS — Z0000 Encounter for general adult medical examination without abnormal findings: Secondary | ICD-10-CM | POA: Diagnosis not present

## 2020-04-27 DIAGNOSIS — I10 Essential (primary) hypertension: Secondary | ICD-10-CM

## 2020-04-27 DIAGNOSIS — E785 Hyperlipidemia, unspecified: Secondary | ICD-10-CM

## 2020-04-27 DIAGNOSIS — M85852 Other specified disorders of bone density and structure, left thigh: Secondary | ICD-10-CM

## 2020-04-27 DIAGNOSIS — B0229 Other postherpetic nervous system involvement: Secondary | ICD-10-CM

## 2020-04-27 DIAGNOSIS — M85851 Other specified disorders of bone density and structure, right thigh: Secondary | ICD-10-CM

## 2020-04-27 NOTE — Progress Notes (Signed)
Chronic Care Management Pharmacy Note  04/28/2020 Name:  Colleen Walsh MRN:  [id number] DOB:  06-29-1950  Subjective: Colleen Walsh is an 70 y.o. year old female who is a primary patient of Burns, Claudina Lick, MD.  The CCM team was consulted for assistance with disease management and care coordination needs.    Engaged with patient face to face for initial visit in response to provider referral for pharmacy case management and/or care coordination services.   Consent to Services:  The patient was given the following information about Chronic Care Management services today, agreed to services, and gave verbal consent: 1. CCM service includes personalized support from designated clinical staff supervised by the primary care provider, including individualized plan of care and coordination with other care providers 2. 24/7 contact phone numbers for assistance for urgent and routine care needs. 3. Service will only be billed when office clinical staff spend 20 minutes or more in a month to coordinate care. 4. Only one practitioner may furnish and bill the service in a calendar month. 5.The patient may stop CCM services at any time (effective at the end of the month) by phone call to the office staff. 6. The patient will be responsible for cost sharing (co-pay) of up to 20% of the service fee (after annual deductible is met). Patient agreed to services and consent obtained.  Patient Care Team: Binnie Rail, MD as PCP - General (Internal Medicine) Inda Castle, MD (Inactive) as Consulting Physician (Gastroenterology) [name], MD (Obstetrics and Gynecology) Lyndal Pulley, DO (Sports Medicine) Charlton Haws, [facility] as Pharmacist (Pharmacist)  Recent office visits: 08/30/19 [name] OV: c/o back pain, rx'd diclofenac, cyclobenzaprine  Recent consult visits: 03/04/20 Dr Dellis Filbert (OB/GYN): well woman exam, normal. Ordered DEXA.  Hospital visits: None in previous 6  months  Objective:  Lab Results  Component Value Date   CREATININE 0.66 07/17/2019   BUN 7 07/17/2019   GFR 89.42 07/17/2019   GFRNONAA >60 02/26/2018   GFRAA >60 02/26/2018   NA 134 (L) 07/17/2019   K 4.1 07/17/2019   CALCIUM 9.9 07/17/2019   CO2 27 07/17/2019   GLUCOSE 94 07/17/2019    Lab Results  Component Value Date/Time   HGBA1C 5.2 07/17/2019 01:46 PM   HGBA1C 5.4 06/26/2018 11:20 AM   GFR 89.42 07/17/2019 01:46 PM   GFR 94.66 06/26/2018 11:20 AM    Last diabetic Eye exam:  Lab Results  Component Value Date/Time   HMDIABEYEEXA No Retinopathy 05/04/2017 12:00 AM    Last diabetic Foot exam: No results found for: HMDIABFOOTEX   Lab Results  Component Value Date   CHOL 233 (H) 07/17/2019   HDL 36.00 (L) 07/17/2019   LDLCALC 144 (H) 06/26/2018   LDLDIRECT 122.0 07/17/2019   TRIG 374.0 (H) 07/17/2019   CHOLHDL 6 07/17/2019    Hepatic Function Latest Ref Rng & Units 07/17/2019 06/26/2018 06/23/2017  Total Protein 6.0 - 8.3 g/dL 7.4 7.4 7.7  Albumin 3.5 - 5.2 g/dL 4.3 4.3 4.3  AST 0 - 37 U/L _0 ALT 0 - 35 U/L _1 Alk Phosphatase 39 - 117 U/L 95 83 85  Total Bilirubin 0.2 - 1.2 mg/dL 0.4 0.5 0.3  Bilirubin, Direct 0.0 - 0.3 mg/dL - - -    Lab Results  Component Value Date/Time   TSH 1.03 07/17/2019 01:46 PM   TSH 1.19 06/26/2018 11:20 AM    CBC Latest Ref Rng & Units 07/17/2019  06/26/2018 02/26/2018  WBC 4.0 - 10.5 K/uL 6.7 5.5 4.6  Hemoglobin 12.0 - 15.0 g/dL 14.0 14.9 13.7  Hematocrit 36.0 - 46.0 % 40.5 44.0 42.5  Platelets 150.0 - 400.0 K/uL 390.0 380.0 325    Lab Results  Component Value Date/Time   VD25OH 38 06/16/2008 12:00 AM    Clinical ASCVD: No  The 10-year ASCVD risk score Mikey Bussing DC Jr., et al., 2013) is: 8.4%   Values used to calculate the score:     Age: 62 years     Sex: Female     Is Non-Hispanic African American: No     Diabetic: No     Tobacco smoker: No     Systolic Blood Pressure: 542 mmHg     Is BP treated: Yes      HDL Cholesterol: 36 mg/dL     Total Cholesterol: 233 mg/dL    Depression screen [facility] 2/9 04/27/2020 07/25/2019 06/26/2018  Decreased Interest 0 1 0  Down, Depressed, Hopeless 0 1 0  PHQ - 2 Score 0 2 0  Altered sleeping - 0 -  Tired, decreased energy - 2 -  Change in appetite - 1 -  Feeling bad or failure about yourself  - 2 -  Trouble concentrating - 1 -  Moving slowly or fidgety/restless - 0 -  Suicidal thoughts - 0 -  PHQ-9 Score - 8 -  Difficult doing work/chores - Somewhat difficult -     Social History   Tobacco Use  Smoking Status Never Smoker  Smokeless Tobacco Never Used   BP Readings from Last 3 Encounters:  04/27/20 110/60  03/04/20 110/74  09/12/19 92/68   Pulse Readings from Last 3 Encounters:  04/27/20 77  09/12/19 68  08/30/19 91   Wt Readings from Last 3 Encounters:  04/27/20 187 lb 6.4 oz (85 kg)  09/12/19 185 lb (83.9 kg)  08/28/19 185 lb (83.9 kg)   BMI Readings from Last 3 Encounters:  04/27/20 35.41 kg/m  09/12/19 34.96 kg/m  08/30/19 34.96 kg/m    Assessment/Interventions: Review of patient past medical history, allergies, medications, health status, including review of consultants reports, laboratory and other test data, was performed as part of comprehensive evaluation and provision of chronic care management services.   SDOH:  (Social Determinants of Health) assessments and interventions performed: Yes  SDOH Screenings   Alcohol Screen: Low Risk   . Last Alcohol Screening Score (AUDIT): 0  Depression (PHQ2-9): Low Risk   . PHQ-2 Score: 0  Financial Resource Strain: Low Risk   . Difficulty of Paying Living Expenses: Not hard at all  Food Insecurity: No Food Insecurity  . Worried About Charity fundraiser in the Last Year: Never true  . Ran Out of Food in the Last Year: Never true  Housing: Low Risk   . Last Housing Risk Score: 0  Physical Activity: Inactive  . Days of Exercise per Week: 0 days  . Minutes of Exercise per Session: 0  min  Social Connections: Socially Integrated  . Frequency of Communication with Friends and Family: More than three times a week  . Frequency of Social Gatherings with Friends and Family: More than three times a week  . Attends Religious Services: More than 4 times per year  . Active Member of Clubs or Organizations: Yes  . Attends Archivist Meetings: More than 4 times per year  . Marital Status: Married  Stress: No Stress Concern Present  . Feeling of Stress :  Not at all  Tobacco Use: Low Risk   . Smoking Tobacco Use: Never Smoker  . Smokeless Tobacco Use: Never Used  Transportation Needs: No Transportation Needs  . Lack of Transportation (Medical): No  . Lack of Transportation (Non-Medical): No    CCM Care Plan  Allergies  Allergen Reactions  . Augmentin [Amoxicillin-Pot Clavulanate]     vomiting  . Valtrex [Valacyclovir Hcl]     Vomiting   . Dexlansoprazole     Raises BP  . Doxycycline     REACTION: rash on hand  . Ergostat [Ergotamine Tartrate]   . Erythromycin     Stomach irritation   . Hydrocodone-Acetaminophen Nausea And Vomiting  . Influenza Vaccines Other (See Comments)    Patient recalls that the reaction was myalgias and fever. This occurred in 2009 and pt has not taken a flu vaccine since.   . Lisinopril     Subtherapeutic  . Omeprazole     REACTION: nausea  . Pantoprazole Sodium     Subtherapeutic  . Phentermine     Raises BP  . Prilosec [Omeprazole Magnesium]   . Propranolol     Betachron  . Ranitidine     Subtherapeutic  . Statins Other (See Comments)    Causes myalgias  . Verapamil     REACTION: nausea    Medications Reviewed Today    Reviewed by Sheral Flow, LPN (Licensed Practical Nurse) on 04/27/20 at 53  Med List Status: <None>  Medication Order Taking? Sig Documenting Provider Last Dose Status Informant  amLODipine (NORVASC) 5 MG tablet [id number]  Take 1 tablet (5 mg total) by mouth daily. Binnie Rail, MD  Active    Ascorbic Acid (VITAMIN C) 1000 MG tablet [id number]  Take 1,000 mg by mouth daily. [provider]  Active   Calcium-Vitamin D (CALTRATE 600 PLUS-VIT D PO) [id number]  Take 1 each by mouth daily. [provider]  Active   cyclobenzaprine (FLEXERIL) 10 MG tablet [id number]  Take 0.5-1 tablets (5-10 mg total) by mouth 3 (three) times daily as needed for muscle spasms. Binnie Rail, MD  Active   diclofenac (VOLTAREN) 75 MG EC tablet [id number]  Take 1 tablet (75 mg total) by mouth 2 (two) times daily. Take with food Binnie Rail, MD  Active   DULoxetine (CYMBALTA) 20 MG capsule [id number]  Take 1 capsule (20 mg total) by mouth daily. Binnie Rail, MD  Active   Multiple Vitamins-Minerals (MULTIVITAMIN,TX-MINERALS) tablet [id number]  Take 1 tablet by mouth daily. [provider]  Active   pregabalin (LYRICA) 100 MG capsule [id number]  Take 1 capsule (100 mg total) by mouth 3 (three) times daily. Binnie Rail, MD  Active           Patient Active Problem List   Diagnosis Date Noted  . Left paraspinal back pain 08/30/2019  . Hypertensive retinopathy of both eyes 09/22/2018  . Post herpetic neuralgia 03/30/2018  . Right leg pain 09/01/2017  . Hyperglycemia 06/23/2017  . Osteopenia 06/22/2016  . Schatzki's ring 08/18/2015  . Hiatal hernia, sm 08/18/2015  . Stricture and stenosis of esophagus 06/22/2015  . Cervical herniated disc 06/22/2015  . Eczema 06/22/2015  . Osteoarthritis of right knee 06/15/2015  . Obese 12/15/2014  . Dyslipidemia 05/19/2008  . Essential hypertension 05/19/2008  . VENEREAL WART 04/04/2008  . Cancer of skin, squamous cell 04/04/2008  . GERD 04/04/2008    Immunization History  Administered Date(s) Administered  . Influenza-Unspecified 11/10/2013  .  PFIZER(Purple Top)SARS-COV-2 Vaccination 03/07/2019, 03/27/2019, 12/27/2019  . Pneumococcal Conjugate-13 12/15/2014  . Pneumococcal Polysaccharide-23 06/26/2018  . Tdap 12/10/2013  . Zoster  06/17/2013  . Zoster Recombinat (Shingrix) 07/23/2019, 09/24/2019    Conditions to be addressed/monitored:  Hypertension, Hyperlipidemia, Osteopenia and Postherpetic neuralgia  Care Plan : [place]  Updates made by Charlton Haws, [place] since 04/28/2020 12:00 AM    Problem: Hypertension, Hyperlipidemia, Osteopenia and Postherpetic neuralgia   Priority: High    Long-Range Goal: Disease management   Start Date: 04/27/2020  Expected End Date: 10/28/2020  This Visit's Progress: On track  Priority: High  Note:   Current Barriers:  . Unable to independently monitor therapeutic efficacy  Pharmacist Clinical Goal(s):  Marland Kitchen Patient will achieve adherence to monitoring guidelines and medication adherence to achieve therapeutic efficacy through collaboration with PharmD and provider.   Interventions: . 1:1 collaboration with Binnie Rail, MD regarding development and update of comprehensive plan of care as evidenced by provider attestation and co-signature . Inter-disciplinary care team collaboration (see longitudinal plan of care) . Comprehensive medication review performed; medication list updated in electronic medical record  Hypertension (BP goal <130/80) -Controlled - pt does not check at home, BP in office is consistently 110s/70-80; pt is interested in coming off of medication -Current treatment: . Amlodipine 5 mg daily -Medications previously tried: lisinopril, verapamil, propranolol   -Current home readings: not checking, wrist cuff is inaccurate -Current dietary habits: no specific diet -Current exercise habits: limited due to low energy -Denies hypotensive/hypertensive symptoms -Educated on BP goals and benefits of medications for prevention of heart attack, stroke and kidney damage; Importance of home blood pressure monitoring; Proper BP monitoring technique; -Counseled to monitor BP at home daily, document, and provide log at future appointments -Recommend  obtaining arm BP home monitor (Omron or similar) to check BP at home; pt can attempt trial off of amlodipine for 2-3 weeks while monitoring BP; if BP is <130/80 off of medication, may consider d/c amlodipine  Hyperlipidemia: (LDL goal < 100) -Not ideally controlled - LDL is 122, TRIG 374; pt has not made significant lifestyle changes since last checked in July 2021 -statin intolerant -Current treatment: . No medications -Medications previously tried: atorvastatin, ezetimibe, fenofibrate  -Educated on Cholesterol goals;  Benefits of statin for ASCVD risk reduction; Importance of limiting foods high in cholesterol; Exercise goal of 150 minutes per week; -Provided patient with low cholesterol diet handout and encouraged daily exercise  Postherpetic neuralgia (Goal: manage symptoms) -Controlled - pt reports improvement in symptoms since starting this combination of medication. Pt has received 2 doses of Shingrix. -Current treatment  . Pregabalin 100 mg TID . Duloxetine 20 mg daily -Counseled on benefits of medication -Recommended to continue current medication  Osteopenia (Goal prevent fractures) -Controlled - pt had DEXA scan last week, results pending.  -Last DEXA Scan: 10/2017  T-Score femoral neck: -2.0  T-Score total hip: -1.5  T-Score lumbar spine: -2.2  10-year probability of major osteoporotic fracture: 13%  10-year probability of hip fracture: 1.7% -Patient is not a candidate for pharmacologic treatment -Current treatment  . Calcium-Vitamin D -Recommend [phone] units of vitamin D daily. Recommend 1200 mg of calcium daily from dietary and supplemental sources. Recommend weight-bearing and muscle strengthening exercises for building and maintaining bone density.  Health Maintenance -Vaccine gaps: covid booster -Current therapy:  . Cyclobenzaprine 10 mg TID prn . Vitamin C 1000 mg daily . Multivitamin . Copper supplement . Burdock root -Patient is satisfied with current  therapy and denies issues -Recommended to continue current medication  Patient Goals/Self-Care Activities . Patient will:  - take medications as prescribed focus on medication adherence by routine check blood pressure daily, document, and provide at future appointments target a minimum of 150 minutes of moderate intensity exercise weekly engage in dietary modifications by reducing cholesterol  -Trial off of amlodipine; if BP remains < 130/80 off of medication, may consider stopping  Follow Up Plan: Telephone follow up appointment with care management team member scheduled for: 1 month      Medication Assistance: None required.  Patient affirms current coverage meets needs.  Patient's preferred pharmacy is:  [place], [place] [address] Phone: [phone] Fax: [phone]  Uses pill box? No - prefers bottles Pt endorses 100% compliance  We discussed: Current pharmacy is preferred with insurance plan and patient is satisfied with pharmacy services Patient decided to: Continue current medication management strategy  Care Plan and Follow Up Patient Decision:  Patient agrees to Care Plan and Follow-up.  Plan: Telephone follow up appointment with care management team member scheduled for:  1 year  Charlene Brooke, PharmD, [place], CPP Clinical Pharmacist [facility] [phone]
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
## 2020-04-27 NOTE — Patient Instructions (Addendum)
Colleen Walsh , Thank you for taking time to come for your Medicare Wellness Visit. I appreciate your ongoing commitment to your health goals. Please review the following plan we discussed and let me know if I can assist you in the future.   Screening recommendations/referrals: Colonoscopy: 09/12/2019; due every 5 years Mammogram: 08/22/2019; due every 1-2 years Bone Density: 10/30/2017; due every 2 years Recommended yearly ophthalmology/optometry visit for glaucoma screening and checkup Recommended yearly dental visit for hygiene and checkup  Vaccinations: Influenza vaccine: declined Pneumococcal vaccine: 12/15/2014, 06/26/2018 Tdap vaccine: 12/10/2013; due every 10 years Shingles vaccine: completed series  Covid-19: 03/07/2019, 03/27/2019, 12/27/2019  Advanced directives: Please bring a copy of your health care power of attorney and living will to the office at your convenience.  Conditions/risks identified: Yes; Reviewed health maintenance screenings with patient today and relevant education, vaccines, and/or referrals were provided. Please continue to do your personal lifestyle choices by: daily care of teeth and gums, regular physical activity (goal should be 5 days a week for 30 minutes), eat a healthy diet, avoid tobacco and drug use, limiting any alcohol intake, taking a low-dose aspirin (if not allergic or have been advised by your provider otherwise) and taking vitamins and minerals as recommended by your provider. Continue doing brain stimulating activities (puzzles, reading, adult coloring books, staying active) to keep memory sharp. Continue to eat heart healthy diet (full of fruits, vegetables, whole grains, lean protein, water--limit salt, fat, and sugar intake) and increase physical activity as tolerated.  Next appointment: Please schedule your next Medicare Wellness Visit with your Nurse Health Advisor in 1 year by calling [phone].   Preventive Care 26 Years and Older,  Female Preventive care refers to lifestyle choices and visits with your health care provider that can promote health and wellness. What does preventive care include?  A yearly physical exam. This is also called an annual well check.  Dental exams once or twice a year.  Routine eye exams. Ask your health care provider how often you should have your eyes checked.  Personal lifestyle choices, including:  Daily care of your teeth and gums.  Regular physical activity.  Eating a healthy diet.  Avoiding tobacco and drug use.  Limiting alcohol use.  Practicing safe sex.  Taking low-dose aspirin every day.  Taking vitamin and mineral supplements as recommended by your health care provider. What happens during an annual well check? The services and screenings done by your health care provider during your annual well check will depend on your age, overall health, lifestyle risk factors, and family history of disease. Counseling  Your health care provider may ask you questions about your:  Alcohol use.  Tobacco use.  Drug use.  Emotional well-being.  Home and relationship well-being.  Sexual activity.  Eating habits.  History of falls.  Memory and ability to understand (cognition).  Work and work Statistician.  Reproductive health. Screening  You may have the following tests or measurements:  Height, weight, and BMI.  Blood pressure.  Lipid and cholesterol levels. These may be checked every 5 years, or more frequently if you are over 67 years old.  Skin check.  Lung cancer screening. You may have this screening every year starting at age 99 if you have a 30-pack-year history of smoking and currently smoke or have quit within the past 15 years.  Fecal occult blood test (FOBT) of the stool. You may have this test every year starting at age 21.  Flexible sigmoidoscopy or colonoscopy. You  may have a sigmoidoscopy every 5 years or a colonoscopy every 10 years  starting at age 11.  Hepatitis C blood test.  Hepatitis B blood test.  Sexually transmitted disease (STD) testing.  Diabetes screening. This is done by checking your blood sugar (glucose) after you have not eaten for a while (fasting). You may have this done every 1-3 years.  Bone density scan. This is done to screen for osteoporosis. You may have this done starting at age 56.  Mammogram. This may be done every 1-2 years. Talk to your health care provider about how often you should have regular mammograms. Talk with your health care provider about your test results, treatment options, and if necessary, the need for more tests. Vaccines  Your health care provider may recommend certain vaccines, such as:  Influenza vaccine. This is recommended every year.  Tetanus, diphtheria, and acellular pertussis (Tdap, Td) vaccine. You may need a Td booster every 10 years.  Zoster vaccine. You may need this after age 38.  Pneumococcal 13-valent conjugate (PCV13) vaccine. One dose is recommended after age 65.  Pneumococcal polysaccharide (PPSV23) vaccine. One dose is recommended after age 21. Talk to your health care provider about which screenings and vaccines you need and how often you need them. This information is not intended to replace advice given to you by your health care provider. Make sure you discuss any questions you have with your health care provider. Document Released: 01/23/2015 Document Revised: 09/16/2015 Document Reviewed: 10/28/2014 Elsevier Interactive Patient Education  2017 [place] Prevention in the Home Falls can cause injuries. They can happen to people of all ages. There are many things you can do to make your home safe and to help prevent falls. What can I do on the outside of my home?  Regularly fix the edges of walkways and driveways and fix any cracks.  Remove anything that might make you trip as you walk through a door, such as a raised step or  threshold.  Trim any bushes or trees on the path to your home.  Use bright outdoor lighting.  Clear any walking paths of anything that might make someone trip, such as rocks or tools.  Regularly check to see if handrails are loose or broken. Make sure that both sides of any steps have handrails.  Any raised decks and porches should have guardrails on the edges.  Have any leaves, snow, or ice cleared regularly.  Use sand or salt on walking paths during winter.  Clean up any spills in your garage right away. This includes oil or grease spills. What can I do in the bathroom?  Use night lights.  Install grab bars by the toilet and in the tub and shower. Do not use towel bars as grab bars.  Use non-skid mats or decals in the tub or shower.  If you need to sit down in the shower, use a plastic, non-slip stool.  Keep the floor dry. Clean up any water that spills on the floor as soon as it happens.  Remove soap buildup in the tub or shower regularly.  Attach bath mats securely with double-sided non-slip rug tape.  Do not have throw rugs and other things on the floor that can make you trip. What can I do in the bedroom?  Use night lights.  Make sure that you have a light by your bed that is easy to reach.  Do not use any sheets or blankets that are too big for  your bed. They should not hang down onto the floor.  Have a firm chair that has side arms. You can use this for support while you get dressed.  Do not have throw rugs and other things on the floor that can make you trip. What can I do in the kitchen?  Clean up any spills right away.  Avoid walking on wet floors.  Keep items that you use a lot in easy-to-reach places.  If you need to reach something above you, use a strong step stool that has a grab bar.  Keep electrical cords out of the way.  Do not use floor polish or wax that makes floors slippery. If you must use wax, use non-skid floor wax.  Do not have  throw rugs and other things on the floor that can make you trip. What can I do with my stairs?  Do not leave any items on the stairs.  Make sure that there are handrails on both sides of the stairs and use them. Fix handrails that are broken or loose. Make sure that handrails are as long as the stairways.  Check any carpeting to make sure that it is firmly attached to the stairs. Fix any carpet that is loose or worn.  Avoid having throw rugs at the top or bottom of the stairs. If you do have throw rugs, attach them to the floor with carpet tape.  Make sure that you have a light switch at the top of the stairs and the bottom of the stairs. If you do not have them, ask someone to add them for you. What else can I do to help prevent falls?  Wear shoes that:  Do not have high heels.  Have rubber bottoms.  Are comfortable and fit you well.  Are closed at the toe. Do not wear sandals.  If you use a stepladder:  Make sure that it is fully opened. Do not climb a closed stepladder.  Make sure that both sides of the stepladder are locked into place.  Ask someone to hold it for you, if possible.  Clearly mark and make sure that you can see:  Any grab bars or handrails.  First and last steps.  Where the edge of each step is.  Use tools that help you move around (mobility aids) if they are needed. These include:  Canes.  Walkers.  Scooters.  Crutches.  Turn on the lights when you go into a dark area. Replace any light bulbs as soon as they burn out.  Set up your furniture so you have a clear path. Avoid moving your furniture around.  If any of your floors are uneven, fix them.  If there are any pets around you, be aware of where they are.  Review your medicines with your doctor. Some medicines can make you feel dizzy. This can increase your chance of falling. Ask your doctor what other things that you can do to help prevent falls. This information is not intended to  replace advice given to you by your health care provider. Make sure you discuss any questions you have with your health care provider. Document Released: 10/23/2008 Document Revised: 06/04/2015 Document Reviewed: 01/31/2014 Elsevier Interactive Patient Education  2017 [employer].

## 2020-04-28 NOTE — Patient Instructions (Addendum)
Visit Information  Phone number for Pharmacist: [phone]  Thank you for meeting with me to discuss your medications! I look forward to working with you to achieve your health care goals. Below is a summary of what we talked about during the visit:  Goals Addressed            This Visit's Progress   . Track and Manage My Blood Pressure-Hypertension       Timeframe:  Short-Term Goal Priority:  High Start Date:     04/28/20                        Expected End Date: 06/08/20                      Follow Up Date 05/15/20   -Buy new blood pressure monitor for upper arm use (Omron or similar) -Trial 2-3 weeks off amlodipine and monitor BP closely - check blood pressure daily - choose a place to take my blood pressure (home, clinic or office, retail store) - write blood pressure results in a log or diary    Why is this important?    You won't feel high blood pressure, but it can still hurt your blood vessels.   High blood pressure can cause heart or kidney problems. It can also cause a stroke.   Making lifestyle changes like losing a little weight or eating less salt will help.   Checking your blood pressure at home and at different times of the day can help to control blood pressure.   If the doctor prescribes medicine remember to take it the way the doctor ordered.   Call the office if you cannot afford the medicine or if there are questions about it.     Notes:       Patient Care Plan: CCM Pharmacy Care Plan    Problem Identified: Hypertension, Hyperlipidemia, Osteopenia and Postherpetic neuralgia   Priority: High    Long-Range Goal: Disease management   Start Date: 04/27/2020  Expected End Date: 10/28/2020  This Visit's Progress: On track  Priority: High  Note:   Current Barriers:  . Unable to independently monitor therapeutic efficacy  Pharmacist Clinical Goal(s):  Marland Kitchen Patient will achieve adherence to monitoring guidelines and medication adherence to achieve  therapeutic efficacy through collaboration with PharmD and provider.   Interventions: . 1:1 collaboration with Binnie Rail, MD regarding development and update of comprehensive plan of care as evidenced by provider attestation and co-signature . Inter-disciplinary care team collaboration (see longitudinal plan of care) . Comprehensive medication review performed; medication list updated in electronic medical record  Hypertension (BP goal <130/80) -Controlled - pt does not check at home, BP in office is consistently 110s/70-80; pt is interested in coming off of medication -Current treatment: . Amlodipine 5 mg daily -Medications previously tried: lisinopril, verapamil, propranolol   -Current home readings: not checking, wrist cuff is inaccurate -Current dietary habits: no specific diet -Current exercise habits: limited due to low energy -Denies hypotensive/hypertensive symptoms -Educated on BP goals and benefits of medications for prevention of heart attack, stroke and kidney damage; Importance of home blood pressure monitoring; Proper BP monitoring technique; -Counseled to monitor BP at home daily, document, and provide log at future appointments -Recommend obtaining arm BP home monitor (Omron or similar) to check BP at home; pt can attempt trial off of amlodipine for 2-3 weeks while monitoring BP; if BP is <130/80 off of medication, may  consider d/c amlodipine  Hyperlipidemia: (LDL goal < 100) -Not ideally controlled - LDL is 122, TRIG 374; pt has not made significant lifestyle changes since last checked in July 2021 -statin intolerant -Current treatment: . No medications -Medications previously tried: atorvastatin, ezetimibe, fenofibrate  -Educated on Cholesterol goals;  Benefits of statin for ASCVD risk reduction; Importance of limiting foods high in cholesterol; Exercise goal of 150 minutes per week; -Provided patient with low cholesterol diet handout and encouraged daily  exercise  Postherpetic neuralgia (Goal: manage symptoms) -Controlled - pt reports improvement in symptoms since starting this combination of medication. Pt has received 2 doses of Shingrix. -Current treatment  . Pregabalin 100 mg TID . Duloxetine 20 mg daily -Counseled on benefits of medication -Recommended to continue current medication  Osteopenia (Goal prevent fractures) -Controlled - pt had DEXA scan last week, results pending.  -Last DEXA Scan: 10/2017  T-Score femoral neck: -2.0  T-Score total hip: -1.5  T-Score lumbar spine: -2.2  10-year probability of major osteoporotic fracture: 13%  10-year probability of hip fracture: 1.7% -Patient is not a candidate for pharmacologic treatment -Current treatment  . Calcium-Vitamin D -Recommend [phone] units of vitamin D daily. Recommend 1200 mg of calcium daily from dietary and supplemental sources. Recommend weight-bearing and muscle strengthening exercises for building and maintaining bone density.  Health Maintenance -Vaccine gaps: covid booster -Current therapy:  . Cyclobenzaprine 10 mg TID prn . Vitamin C 1000 mg daily . Multivitamin . Copper supplement . Burdock root -Patient is satisfied with current therapy and denies issues -Recommended to continue current medication  Patient Goals/Self-Care Activities . Patient will:  - take medications as prescribed focus on medication adherence by routine check blood pressure daily, document, and provide at future appointments target a minimum of 150 minutes of moderate intensity exercise weekly engage in dietary modifications by reducing cholesterol  -Trial off of amlodipine; if BP remains < 130/80 off of medication, may consider stopping  Follow Up Plan: Telephone follow up appointment with care management team member scheduled for: 1 month      Colleen Walsh was given information about Chronic Care Management services today including:  1. CCM service includes personalized  support from designated clinical staff supervised by her physician, including individualized plan of care and coordination with other care providers 2. 24/7 contact phone numbers for assistance for urgent and routine care needs. 3. Standard insurance, coinsurance, copays and deductibles apply for chronic care management only during months in which we provide at least 20 minutes of these services. Most insurances cover these services at 100%, however patients may be responsible for any copay, coinsurance and/or deductible if applicable. This service may help you avoid the need for more expensive face-to-face services. 4. Only one practitioner may furnish and bill the service in a calendar month. 5. The patient may stop CCM services at any time (effective at the end of the month) by phone call to the office staff.  Patient agreed to services and verbal consent obtained.   Patient verbalizes understanding of instructions provided today and agrees to view in [place].  Telephone follow up appointment with pharmacy team member scheduled for: 1 month  Charlene Brooke, PharmD, [place], CPP Clinical Pharmacist [facility] [phone]  Cholesterol Content in Foods Cholesterol is a waxy, fat-like substance that helps to carry fat in the blood. The body needs cholesterol in small amounts, but too much cholesterol can cause damage to the arteries and heart. Most people should eat less than  200 milligrams (mg) of cholesterol a day. Foods with cholesterol Cholesterol is found in animal-based foods, such as meat, seafood, and dairy. Generally, low-fat dairy and lean meats have less cholesterol than full-fat dairy and fatty meats. The milligrams of cholesterol per serving (mg per serving) of common cholesterol-containing foods are listed below. Meat and other proteins  Egg -- one large whole egg has 186 mg.  Veal shank -- 4 oz has 141 mg.  Lean ground [place] (93% lean) -- 4 oz has  118 mg.  Fat-trimmed lamb loin -- 4 oz has 106 mg.  Lean ground beef (90% lean) -- 4 oz has 100 mg.  Lobster -- 3.5 oz has 90 mg.  Pork loin chops -- 4 oz has 86 mg.  Canned salmon -- 3.5 oz has 83 mg.  Fat-trimmed beef top loin -- 4 oz has 78 mg.  Frankfurter -- 1 frank (3.5 oz) has 77 mg.  Crab -- 3.5 oz has 71 mg.  Roasted chicken without skin, white meat -- 4 oz has 66 mg.  Light bologna -- 2 oz has 45 mg.  Deli-cut [place] -- 2 oz has 31 mg.  Canned tuna -- 3.5 oz has 31 mg.  Berniece Salines -- 1 oz has 29 mg.  Oysters and mussels (raw) -- 3.5 oz has 25 mg.  Mackerel -- 1 oz has 22 mg.  Trout -- 1 oz has 20 mg.  Pork sausage -- 1 link (1 oz) has 17 mg.  Salmon -- 1 oz has 16 mg.  Tilapia -- 1 oz has 14 mg. Dairy  Soft-serve ice cream --  cup (4 oz) has 103 mg.  Whole-milk yogurt -- 1 cup (8 oz) has 29 mg.  Cheddar cheese -- 1 oz has 28 mg.  American cheese -- 1 oz has 28 mg.  Whole milk -- 1 cup (8 oz) has 23 mg.  2% milk -- 1 cup (8 oz) has 18 mg.  Cream cheese -- 1 tablespoon (Tbsp) has 15 mg.  Cottage cheese --  cup (4 oz) has 14 mg.  Low-fat (1%) milk -- 1 cup (8 oz) has 10 mg.  Sour cream -- 1 Tbsp has 8.5 mg.  Low-fat yogurt -- 1 cup (8 oz) has 8 mg.  Nonfat Greek yogurt -- 1 cup (8 oz) has 7 mg.  Half-and-half cream -- 1 Tbsp has 5 mg. Fats and oils  Cod liver oil -- 1 tablespoon (Tbsp) has 82 mg.  Butter -- 1 Tbsp has 15 mg.  Lard -- 1 Tbsp has 14 mg.  Bacon grease -- 1 Tbsp has 14 mg.  Mayonnaise -- 1 Tbsp has 5-10 mg.  Margarine -- 1 Tbsp has 3-10 mg. Exact amounts of cholesterol in these foods may vary depending on specific ingredients and brands.   Foods without cholesterol Most plant-based foods do not have cholesterol unless you combine them with a food that has cholesterol. Foods without cholesterol include:  Grains and cereals.  Vegetables.  Fruits.  Vegetable oils, such as olive, canola, and sunflower  oil.  Legumes, such as peas, beans, and lentils.  Nuts and seeds.  Egg whites.   Summary  The body needs cholesterol in small amounts, but too much cholesterol can cause damage to the arteries and heart.  Most people should eat less than 200 milligrams (mg) of cholesterol a day. This information is not intended to replace advice given to you by your health care provider. Make sure you discuss any questions you have with your  health care provider. Document Revised: 05/20/2019 Document Reviewed: 05/20/2019 Elsevier Patient Education  [place].

## 2020-05-01 ENCOUNTER — Telehealth: Payer: Self-pay | Admitting: Pharmacist

## 2020-05-01 MED ORDER — PREGABALIN 100 MG PO CAPS: 100.0000 mg | ORAL_CAPSULE | Freq: Three times a day (TID) | ORAL | 0 refills | Status: DC

## 2020-05-01 NOTE — Telephone Encounter (Signed)
Patient called to request refill:  pregabalin (LYRICA) 100 MG capsule [[id number]]   [facility] [place] [place] [address] Phone: [phone] Fax: [phone]

## 2020-05-06 ENCOUNTER — Other Ambulatory Visit: Payer: Self-pay | Admitting: Internal Medicine

## 2020-05-11 ENCOUNTER — Ambulatory Visit: Payer: PPO | Admitting: Obstetrics & Gynecology

## 2020-05-13 ENCOUNTER — Ambulatory Visit: Payer: PPO | Admitting: Obstetrics & Gynecology

## 2020-05-13 ENCOUNTER — Encounter: Payer: Self-pay | Admitting: Obstetrics & Gynecology

## 2020-05-13 ENCOUNTER — Other Ambulatory Visit: Payer: Self-pay

## 2020-05-13 VITALS — BP 118/72 | Ht 60.0 in | Wt 187.0 lb

## 2020-05-13 DIAGNOSIS — M85851 Other specified disorders of bone density and structure, right thigh: Secondary | ICD-10-CM | POA: Diagnosis not present

## 2020-05-13 DIAGNOSIS — M85852 Other specified disorders of bone density and structure, left thigh: Secondary | ICD-10-CM

## 2020-05-13 NOTE — Progress Notes (Signed)
    Colleen Walsh 1950/02/04 [id number]        70 y.o.  G2P0002   RP: Counseling on Osteopenia with Frax at 4.1% at the hip  HPI: Bone density 04/21/2020 with Osteopenia Rt Femoral Neck T-Score -2.4.  Frax at 4.1% at the hip.  BD significantly decreased at the Rt hip and increased at the AP Spine.   No particular risk of fall.  No current fracture.  Taking Ca++ and Vit D supplements.  Not very active physically.   OB History  Gravida Para Term Preterm AB Living  2 0     0 2  SAB IAB Ectopic Multiple Live Births  0   0        # Outcome Date GA Lbr Len/2nd Weight Sex Delivery Anes PTL Lv  2 Gravida           1 Gravida             Past medical history,surgical history, problem list, medications, allergies, family history and social history were all reviewed and documented in the EPIC chart.   Directed ROS with pertinent positives and negatives documented in the history of present illness/assessment and plan.  Exam:  Vitals:   05/13/20 1533  BP: 118/72  Weight: 187 lb (84.8 kg)  Height: 5' (1.524 m)   General appearance:  Normal  Bone Density 04/21/2020: Osteopenia Rt Femoral Neck T-Score -2.4.  Frax at 4.1% at the hip.  BD significantly decreased at the Rt hip and increased at the AP Spine.   Assessment/Plan:  70 y.o. G2P0002   1. Osteopenia of necks of both femurs Bone density April 21, 2020 thoroughly reviewed with patient.  Osteopenia with the lowest T score at the right femoral neck at -2.4.  Significant decrease at the right hip and significant increased at the AP spine.  FRAX shows a 4.1% risk of fracture at the hip.  Bone medication options reviewed with patient.  Patient declines Bone medication at this time.  Will optimize her calcium intake at 1.5 g/day total and continue on vitamin D supplements.  Increase weightbearing physical activities to at least 5 times a week with light weightlifting every 2 days.  We will repeat a bone density in 2 years. - Vitamin D  1,25 dihydroxy  Colleen Bruins MD, 3:51 PM 05/13/2020

## 2020-05-14 ENCOUNTER — Telehealth: Payer: Self-pay

## 2020-05-14 NOTE — Telephone Encounter (Addendum)
Patient said she saw Dr. Marguerita Merles yesterday and she was going to send a prescription in for a rash under her breasts. She thought she understood it would be a powder. She checked with pharmacy but it is not there.  I told her we will call her when we send it in.

## 2020-05-15 ENCOUNTER — Telehealth: Payer: PPO

## 2020-05-15 MED ORDER — NYSTATIN 100000 UNIT/GM EX POWD: 1.0000 "application " | Freq: Two times a day (BID) | CUTANEOUS | 4 refills | Status: DC | PRN

## 2020-05-15 NOTE — Telephone Encounter (Signed)
Dr.Lavoie sent message saying Please send Nystatin powder BID PRN. Refill x 4.   Patient aware, Rx sent.

## 2020-05-15 NOTE — Progress Notes (Deleted)
Chronic Care Management Pharmacy Note  05/15/2020 Name:  Colleen Walsh MRN:  [id number] DOB:  February 20, 1950  Subjective: Colleen Walsh is an 70 y.o. year old female who is a primary patient of Burns, Claudina Lick, MD.  The CCM team was consulted for assistance with disease management and care coordination needs.    Engaged with patient by telephone for follow up visit in response to provider referral for pharmacy case management and/or care coordination services.   Consent to Services:  The patient was given information about Chronic Care Management services, agreed to services, and gave verbal consent prior to initiation of services.  Please see initial visit note for detailed documentation.   Patient Care Team: Binnie Rail, MD as PCP - General (Internal Medicine) Inda Castle, MD (Inactive) as Consulting Physician (Gastroenterology) [name], MD (Obstetrics and Gynecology) Lyndal Pulley, DO (Sports Medicine) Charlton Haws, [facility] as Pharmacist (Pharmacist)  Recent office visits: 08/30/19 [name] OV: c/o back pain, rx'd diclofenac, cyclobenzaprine  Recent consult visits: 03/04/20 Dr Dellis Filbert (OB/GYN): well woman exam, normal. Ordered DEXA.  Hospital visits: None in previous 6 months  Objective:  Lab Results  Component Value Date   CREATININE 0.66 07/17/2019   BUN 7 07/17/2019   GFR 89.42 07/17/2019   GFRNONAA >60 02/26/2018   GFRAA >60 02/26/2018   NA 134 (L) 07/17/2019   K 4.1 07/17/2019   CALCIUM 9.9 07/17/2019   CO2 27 07/17/2019   GLUCOSE 94 07/17/2019    Lab Results  Component Value Date/Time   HGBA1C 5.2 07/17/2019 01:46 PM   HGBA1C 5.4 06/26/2018 11:20 AM   GFR 89.42 07/17/2019 01:46 PM   GFR 94.66 06/26/2018 11:20 AM    Last diabetic Eye exam:  Lab Results  Component Value Date/Time   HMDIABEYEEXA No Retinopathy 03/17/2020 01:39 PM    Last diabetic Foot exam: No results found for: HMDIABFOOTEX   Lab Results  Component  Value Date   CHOL 233 (H) 07/17/2019   HDL 36.00 (L) 07/17/2019   LDLCALC 144 (H) 06/26/2018   LDLDIRECT 122.0 07/17/2019   TRIG 374.0 (H) 07/17/2019   CHOLHDL 6 07/17/2019    Hepatic Function Latest Ref Rng & Units 07/17/2019 06/26/2018 06/23/2017  Total Protein 6.0 - 8.3 g/dL 7.4 7.4 7.7  Albumin 3.5 - 5.2 g/dL 4.3 4.3 4.3  AST 0 - 37 U/L 15 10 10   ALT 0 - 35 U/L 12 9 12   Alk Phosphatase 39 - 117 U/L 95 83 85  Total Bilirubin 0.2 - 1.2 mg/dL 0.4 0.5 0.3  Bilirubin, Direct 0.0 - 0.3 mg/dL - - -    Lab Results  Component Value Date/Time   TSH 1.03 07/17/2019 01:46 PM   TSH 1.19 06/26/2018 11:20 AM    CBC Latest Ref Rng & Units 07/17/2019 06/26/2018 02/26/2018  WBC 4.0 - 10.5 K/uL 6.7 5.5 4.6  Hemoglobin 12.0 - 15.0 g/dL 14.0 14.9 13.7  Hematocrit 36.0 - 46.0 % 40.5 44.0 42.5  Platelets 150.0 - 400.0 K/uL 390.0 380.0 325    Lab Results  Component Value Date/Time   VD25OH 38 06/16/2008 12:00 AM    Clinical ASCVD: No  The 10-year ASCVD risk score Mikey Bussing DC Jr., et al., 2013) is: 9.6%   Values used to calculate the score:     Age: 76 years     Sex: Female     Is Non-Hispanic African American: No     Diabetic: No     Tobacco smoker:  No     Systolic Blood Pressure: 462 mmHg     Is BP treated: Yes     HDL Cholesterol: 36 mg/dL     Total Cholesterol: 233 mg/dL    Depression screen [facility] 2/9 04/27/2020 07/25/2019 06/26/2018  Decreased Interest 0 1 0  Down, Depressed, Hopeless 0 1 0  PHQ - 2 Score 0 2 0  Altered sleeping - 0 -  Tired, decreased energy - 2 -  Change in appetite - 1 -  Feeling bad or failure about yourself  - 2 -  Trouble concentrating - 1 -  Moving slowly or fidgety/restless - 0 -  Suicidal thoughts - 0 -  PHQ-9 Score - 8 -  Difficult doing work/chores - Somewhat difficult -     Social History   Tobacco Use  Smoking Status Never Smoker  Smokeless Tobacco Never Used   BP Readings from Last 3 Encounters:  05/13/20 118/72  04/27/20 110/60  03/04/20  110/74   Pulse Readings from Last 3 Encounters:  04/27/20 77  09/12/19 68  08/30/19 91   Wt Readings from Last 3 Encounters:  05/13/20 187 lb (84.8 kg)  04/27/20 187 lb 6.4 oz (85 kg)  09/12/19 185 lb (83.9 kg)   BMI Readings from Last 3 Encounters:  05/13/20 36.52 kg/m  04/27/20 35.41 kg/m  09/12/19 34.96 kg/m    Assessment/Interventions: Review of patient past medical history, allergies, medications, health status, including review of consultants reports, laboratory and other test data, was performed as part of comprehensive evaluation and provision of chronic care management services.   SDOH:  (Social Determinants of Health) assessments and interventions performed: Yes  SDOH Screenings   Alcohol Screen: Low Risk   . Last Alcohol Screening Score (AUDIT): 0  Depression (PHQ2-9): Low Risk   . PHQ-2 Score: 0  Financial Resource Strain: Low Risk   . Difficulty of Paying Living Expenses: Not hard at all  Food Insecurity: No Food Insecurity  . Worried About Charity fundraiser in the Last Year: Never true  . Ran Out of Food in the Last Year: Never true  Housing: Low Risk   . Last Housing Risk Score: 0  Physical Activity: Inactive  . Days of Exercise per Week: 0 days  . Minutes of Exercise per Session: 0 min  Social Connections: Socially Integrated  . Frequency of Communication with Friends and Family: More than three times a week  . Frequency of Social Gatherings with Friends and Family: More than three times a week  . Attends Religious Services: More than 4 times per year  . Active Member of Clubs or Organizations: Yes  . Attends Archivist Meetings: More than 4 times per year  . Marital Status: Married  Stress: No Stress Concern Present  . Feeling of Stress : Not at all  Tobacco Use: Low Risk   . Smoking Tobacco Use: Never Smoker  . Smokeless Tobacco Use: Never Used  Transportation Needs: No Transportation Needs  . Lack of Transportation (Medical): No  .  Lack of Transportation (Non-Medical): No    CCM Care Plan  Allergies  Allergen Reactions  . Augmentin [Amoxicillin-Pot Clavulanate]     vomiting  . Valtrex [Valacyclovir Hcl]     Vomiting   . Dexlansoprazole     Raises BP  . Doxycycline     REACTION: rash on hand  . Ergostat [Ergotamine Tartrate]   . Erythromycin     Stomach irritation   . Hydrocodone-Acetaminophen Nausea And  Vomiting  . Influenza Vaccines Other (See Comments)    Patient recalls that the reaction was myalgias and fever. This occurred in 2009 and pt has not taken a flu vaccine since.   . Lisinopril     Subtherapeutic  . Omeprazole     REACTION: nausea  . Pantoprazole Sodium     Subtherapeutic  . Phentermine     Raises BP  . Prilosec [Omeprazole Magnesium]   . Propranolol     Betachron  . Ranitidine     Subtherapeutic  . Statins Other (See Comments)    Causes myalgias  . Verapamil     REACTION: nausea    Medications Reviewed Today    Reviewed by Leron Croak, CMA (Certified Medical Assistant) on 05/13/20 at 1534  Med List Status: <None>  Medication Order Taking? Sig Documenting Provider Last Dose Status Informant  amLODipine (NORVASC) 5 MG tablet [id number] Yes Take 1 tablet by mouth once daily Burns, Claudina Lick, MD Taking Active   Ascorbic Acid (VITAMIN C) 1000 MG tablet [id number] Yes Take 1,000 mg by mouth daily. [provider] Taking Active   Calcium-Vitamin D (CALTRATE 600 PLUS-VIT D PO) [id number] Yes Take 1 each by mouth daily. [provider] Taking Active   calcium-vitamin D (OSCAL WITH D) 250-125 MG-UNIT tablet [id number] Yes Take 1 tablet by mouth daily. [provider] Taking Active   Copper 5 MG CAPS [id number] Yes Take by mouth. [provider] Taking Active   cyclobenzaprine (FLEXERIL) 10 MG tablet [id number] Yes Take 0.5-1 tablets (5-10 mg total) by mouth 3 (three) times daily as needed for muscle spasms. Binnie Rail, MD Taking Active   DULoxetine  (CYMBALTA) 20 MG capsule [id number] Yes Take 1 capsule (20 mg total) by mouth daily.  Patient taking differently: Take 20 mg by mouth 2 (two) times daily.   Binnie Rail, MD Taking Active Self  Multiple Vitamins-Minerals (MULTIVITAMIN,TX-MINERALS) tablet [id number] Yes Take 1 tablet by mouth daily. [provider] Taking Active   pregabalin (LYRICA) 100 MG capsule [id number] Yes Take 1 capsule (100 mg total) by mouth 3 (three) times daily. Binnie Rail, MD Taking Active           Patient Active Problem List   Diagnosis Date Noted  . Left paraspinal back pain 08/30/2019  . Hypertensive retinopathy of both eyes 09/22/2018  . Post herpetic neuralgia 03/30/2018  . Right leg pain 09/01/2017  . Hyperglycemia 06/23/2017  . Osteopenia 06/22/2016  . Schatzki's ring 08/18/2015  . Hiatal hernia, sm 08/18/2015  . Stricture and stenosis of esophagus 06/22/2015  . Cervical herniated disc 06/22/2015  . Eczema 06/22/2015  . Osteoarthritis of right knee 06/15/2015  . Obese 12/15/2014  . Dyslipidemia 05/19/2008  . Essential hypertension 05/19/2008  . VENEREAL WART 04/04/2008  . Cancer of skin, squamous cell 04/04/2008  . GERD 04/04/2008    Immunization History  Administered Date(s) Administered  . Influenza-Unspecified 11/10/2013  . PFIZER(Purple Top)SARS-COV-2 Vaccination 03/07/2019, 03/27/2019, 12/27/2019  . Pneumococcal Conjugate-13 12/15/2014  . Pneumococcal Polysaccharide-23 06/26/2018  . Tdap 12/10/2013  . Zoster 06/17/2013  . Zoster Recombinat (Shingrix) 07/23/2019, 09/24/2019    Conditions to be addressed/monitored:  Hypertension, Hyperlipidemia, Osteopenia and Postherpetic neuralgia  Patient Care Plan: CCM Pharmacy Care Plan    Problem Identified: Hypertension, Hyperlipidemia, Osteopenia and Postherpetic neuralgia   Priority: High    Long-Range Goal: Disease management   Start Date: 04/27/2020  Expected End Date: 10/28/2020  This Visit's Progress: On track   Priority:  High  Note:   Current Barriers:  . Unable to independently monitor therapeutic efficacy  Pharmacist Clinical Goal(s):  Marland Kitchen Patient will achieve adherence to monitoring guidelines and medication adherence to achieve therapeutic efficacy through collaboration with PharmD and provider.   Interventions: . 1:1 collaboration with Binnie Rail, MD regarding development and update of comprehensive plan of care as evidenced by provider attestation and co-signature . Inter-disciplinary care team collaboration (see longitudinal plan of care) . Comprehensive medication review performed; medication list updated in electronic medical record  Hypertension (BP goal <130/80) -Controlled - pt does not check at home, BP in office is consistently 110s/70-80; pt is interested in coming off of medication -Current treatment: . Amlodipine 5 mg daily -Medications previously tried: lisinopril, verapamil, propranolol   -Current home readings: not checking, wrist cuff is inaccurate -Current dietary habits: no specific diet -Current exercise habits: limited due to low energy -Denies hypotensive/hypertensive symptoms -Educated on BP goals and benefits of medications for prevention of heart attack, stroke and kidney damage; Importance of home blood pressure monitoring; Proper BP monitoring technique; -Counseled to monitor BP at home daily, document, and provide log at future appointments -Recommend obtaining arm BP home monitor (Omron or similar) to check BP at home; pt can attempt trial off of amlodipine for 2-3 weeks while monitoring BP; if BP is <130/80 off of medication, may consider d/c amlodipine  Hyperlipidemia: (LDL goal < 100) -Not ideally controlled - LDL is 122, TRIG 374; pt has not made significant lifestyle changes since last checked in July 2021 -statin intolerant -Current treatment: . No medications -Medications previously tried: atorvastatin, ezetimibe, fenofibrate  -Educated on  Cholesterol goals;  Benefits of statin for ASCVD risk reduction; Importance of limiting foods high in cholesterol; Exercise goal of 150 minutes per week; -Provided patient with low cholesterol diet handout and encouraged daily exercise  Postherpetic neuralgia (Goal: manage symptoms) -Controlled - pt reports improvement in symptoms since starting this combination of medication. Pt has received 2 doses of Shingrix. -Current treatment  . Pregabalin 100 mg TID . Duloxetine 20 mg daily -Counseled on benefits of medication -Recommended to continue current medication  Osteopenia (Goal prevent fractures) -Controlled - pt had DEXA scan last week, results pending.  -Last DEXA Scan: 10/2017  T-Score femoral neck: -2.0  T-Score total hip: -1.5  T-Score lumbar spine: -2.2  10-year probability of major osteoporotic fracture: 13%  10-year probability of hip fracture: 1.7% -Patient is not a candidate for pharmacologic treatment -Current treatment  . Calcium-Vitamin D -Recommend [phone] units of vitamin D daily. Recommend 1200 mg of calcium daily from dietary and supplemental sources. Recommend weight-bearing and muscle strengthening exercises for building and maintaining bone density.  Health Maintenance -Vaccine gaps: covid booster -Current therapy:  . Cyclobenzaprine 10 mg TID prn . Vitamin C 1000 mg daily . Multivitamin . Copper supplement . Burdock root -Patient is satisfied with current therapy and denies issues -Recommended to continue current medication  Patient Goals/Self-Care Activities . Patient will:  - take medications as prescribed focus on medication adherence by routine check blood pressure daily, document, and provide at future appointments target a minimum of 150 minutes of moderate intensity exercise weekly engage in dietary modifications by reducing cholesterol  -Trial off of amlodipine; if BP remains < 130/80 off of medication, may consider stopping  Follow Up  Plan: Telephone follow up appointment with care management team member scheduled for: 1 month      Medication Assistance: None required.  Patient affirms current coverage meets  needs.  Patient's preferred pharmacy is:  [facility], [place] [address] Phone: [phone] Fax: [phone]  Uses pill box? No - prefers bottles Pt endorses 100% compliance  We discussed: Current pharmacy is preferred with insurance plan and patient is satisfied with pharmacy services Patient decided to: Continue current medication management strategy  Care Plan and Follow Up Patient Decision:  Patient agrees to Care Plan and Follow-up.  Plan: Telephone follow up appointment with care management team member scheduled for:  1 year  Charlene Brooke, PharmD, [place], CPP Clinical Pharmacist [facility] [phone]

## 2020-05-16 ENCOUNTER — Encounter: Payer: Self-pay | Admitting: Obstetrics & Gynecology

## 2020-05-16 LAB — VITAMIN D 1,25 DIHYDROXY
Vitamin D 1, 25 (OH)2 Total: 51 pg/mL (ref 18–72)
Vitamin D2 1, 25 (OH)2: <8 pg/mL
Vitamin D3 1, 25 (OH)2: 51 pg/mL

## 2020-07-07 DIAGNOSIS — L84 Corns and callosities: Secondary | ICD-10-CM | POA: Diagnosis not present

## 2020-07-07 DIAGNOSIS — M7989 Other specified soft tissue disorders: Secondary | ICD-10-CM | POA: Diagnosis not present

## 2020-07-18 NOTE — Patient Instructions (Addendum)
  Blood work was ordered.      Medications changes include :   none     Please followup in 1 year
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
## 2020-07-20 ENCOUNTER — Encounter: Payer: Self-pay | Admitting: Internal Medicine

## 2020-07-20 ENCOUNTER — Ambulatory Visit (INDEPENDENT_AMBULATORY_CARE_PROVIDER_SITE_OTHER): Payer: PPO | Admitting: Internal Medicine

## 2020-07-20 ENCOUNTER — Other Ambulatory Visit: Payer: Self-pay

## 2020-07-20 VITALS — BP 114/72 | HR 88 | Temp 98.3°F | Ht 60.0 in | Wt 182.0 lb

## 2020-07-20 DIAGNOSIS — Z0000 Encounter for general adult medical examination without abnormal findings: Secondary | ICD-10-CM

## 2020-07-20 DIAGNOSIS — M85852 Other specified disorders of bone density and structure, left thigh: Secondary | ICD-10-CM

## 2020-07-20 DIAGNOSIS — M85851 Other specified disorders of bone density and structure, right thigh: Secondary | ICD-10-CM

## 2020-07-20 DIAGNOSIS — R739 Hyperglycemia, unspecified: Secondary | ICD-10-CM | POA: Diagnosis not present

## 2020-07-20 DIAGNOSIS — L84 Corns and callosities: Secondary | ICD-10-CM | POA: Insufficient documentation

## 2020-07-20 DIAGNOSIS — I10 Essential (primary) hypertension: Secondary | ICD-10-CM

## 2020-07-20 DIAGNOSIS — Z85828 Personal history of other malignant neoplasm of skin: Secondary | ICD-10-CM | POA: Diagnosis not present

## 2020-07-20 DIAGNOSIS — E785 Hyperlipidemia, unspecified: Secondary | ICD-10-CM | POA: Diagnosis not present

## 2020-07-20 DIAGNOSIS — Z6835 Body mass index (BMI) 35.0-35.9, adult: Secondary | ICD-10-CM | POA: Diagnosis not present

## 2020-07-20 DIAGNOSIS — B0229 Other postherpetic nervous system involvement: Secondary | ICD-10-CM | POA: Diagnosis not present

## 2020-07-20 DIAGNOSIS — M5020 Other cervical disc displacement, unspecified cervical region: Secondary | ICD-10-CM | POA: Diagnosis not present

## 2020-07-20 LAB — LIPID PANEL
Cholesterol: 245 mg/dL — ABNORMAL HIGH (ref 0–200)
HDL: 46.6 mg/dL (ref >39.00)
LDL Cholesterol: 166 mg/dL — ABNORMAL HIGH (ref 0–99)
NonHDL: 197.91
Total CHOL/HDL Ratio: 5
Triglycerides: 158 mg/dL — ABNORMAL HIGH (ref 0.0–149.0)
VLDL: 31.6 mg/dL (ref 0.0–40.0)

## 2020-07-20 LAB — COMPREHENSIVE METABOLIC PANEL
ALT: 11 U/L (ref 0–35)
AST: 13 U/L (ref 0–37)
Albumin: 4.4 g/dL (ref 3.5–5.2)
Alkaline Phosphatase: 87 U/L (ref 39–117)
BUN: 9 mg/dL (ref 6–23)
CO2: 27 mEq/L (ref 19–32)
Calcium: 9.8 mg/dL (ref 8.4–10.5)
Chloride: 101 mEq/L (ref 96–112)
Creatinine, Ser: 0.69 mg/dL (ref 0.40–1.20)
GFR: 89.64 mL/min (ref >60.00)
Glucose, Bld: 73 mg/dL (ref 70–99)
Potassium: 4.3 mEq/L (ref 3.5–5.1)
Sodium: 135 mEq/L (ref 135–145)
Total Bilirubin: 0.5 mg/dL (ref 0.2–1.2)
Total Protein: 7.6 g/dL (ref 6.0–8.3)

## 2020-07-20 LAB — CBC WITH DIFFERENTIAL/PLATELET
Basophils Absolute: 0.1 10*3/uL (ref 0.0–0.1)
Basophils Relative: 1.2 % (ref 0.0–3.0)
Eosinophils Absolute: 0.1 10*3/uL (ref 0.0–0.7)
Eosinophils Relative: 1.2 % (ref 0.0–5.0)
HCT: 41.2 % (ref 36.0–46.0)
Hemoglobin: 14.1 g/dL (ref 12.0–15.0)
Lymphocytes Relative: 32.9 % (ref 12.0–46.0)
Lymphs Abs: 2.2 10*3/uL (ref 0.7–4.0)
MCHC: 34.1 g/dL (ref 30.0–36.0)
MCV: 89.5 fl (ref 78.0–100.0)
Monocytes Absolute: 0.5 10*3/uL (ref 0.1–1.0)
Monocytes Relative: 7.6 % (ref 3.0–12.0)
Neutro Abs: 3.8 10*3/uL (ref 1.4–7.7)
Neutrophils Relative %: 57.1 % (ref 43.0–77.0)
Platelets: 345 10*3/uL (ref 150.0–400.0)
RBC: 4.61 Mil/uL (ref 3.87–5.11)
RDW: 13.3 % (ref 11.5–15.5)
WBC: 6.7 10*3/uL (ref 4.0–10.5)

## 2020-07-20 LAB — HEMOGLOBIN A1C: Hgb A1c MFr Bld: 5.5 % (ref 4.6–6.5)

## 2020-07-20 LAB — TSH: TSH: 0.99 u[IU]/mL (ref 0.35–5.50)

## 2020-07-20 MED ORDER — PREGABALIN 100 MG PO CAPS: 100.0000 mg | ORAL_CAPSULE | Freq: Three times a day (TID) | ORAL | 5 refills | Status: DC

## 2020-07-20 NOTE — Assessment & Plan Note (Signed)
Chronic BP well controlled Continue amlodipine 5 mg qd cmp

## 2020-07-20 NOTE — Assessment & Plan Note (Signed)
Chronic Still with pain but better controlled Continue lyrica 100 mg tid Continue cymbalta 20 mg qd

## 2020-07-20 NOTE — Assessment & Plan Note (Signed)
Chronic Saw podiatry

## 2020-07-20 NOTE — Assessment & Plan Note (Signed)
Chronic Intermittent pain Taking flexeril 10 mg at night prn -continue

## 2020-07-20 NOTE — Assessment & Plan Note (Signed)
Chronic Check lipid panel  Diet controlled Regular exercise and healthy diet encouraged

## 2020-07-20 NOTE — Assessment & Plan Note (Signed)
Sees derm annually 

## 2020-07-20 NOTE — Addendum Note (Signed)
Addended by: Jacobo Forest on: 07/20/2020 01:36 PM   Modules accepted: Orders

## 2020-07-20 NOTE — Assessment & Plan Note (Signed)
Chronic Check a1c Low sugar / carb diet Stressed regular exercise  

## 2020-07-20 NOTE — Assessment & Plan Note (Signed)
Chronic Encouraged more regular exercise Eating healthy, advised decreased portions

## 2020-07-20 NOTE — Assessment & Plan Note (Signed)
Chronic dexa up to date Encouraged increased exercise Continue calcium and vitamin d

## 2020-07-28 ENCOUNTER — Telehealth: Payer: Self-pay | Admitting: Pharmacist

## 2020-07-28 NOTE — Progress Notes (Signed)
    Chronic Care Management Pharmacy Assistant   Name: Colleen Walsh  MRN: [id number] DOB: 01/05/1951   Reason for Encounter: Disease State   Conditions to be addressed/monitored: General Call   Recent office visits:  07/20/20 Dr. Quay Burow Internal Medicine (Annual Exam) Blood work was ordered,Please followup in 1 year  Recent consult visits:  05/13/20 Dr. [name] Gynecology (Osteopenia of neck and both femurs)Will optimize her calcium intake at 1.5 g/day total and continue on vitamin D supplements.  Hospital visits:  None in previous 6 months  Medications: Outpatient Encounter Medications as of 07/28/2020  Medication Sig   amLODipine (NORVASC) 5 MG tablet Take 1 tablet by mouth once daily   Ascorbic Acid (VITAMIN C) 1000 MG tablet Take 1,000 mg by mouth daily.   Calcium-Vitamin D (CALTRATE 600 PLUS-VIT D PO) Take 1 each by mouth daily.   Copper 5 MG CAPS Take by mouth.   cyclobenzaprine (FLEXERIL) 10 MG tablet Take 0.5-1 tablets (5-10 mg total) by mouth 3 (three) times daily as needed for muscle spasms.   DULoxetine (CYMBALTA) 20 MG capsule Take 1 capsule (20 mg total) by mouth daily. (Patient taking differently: Take 20 mg by mouth 2 (two) times daily.)   Multiple Vitamins-Minerals (MULTIVITAMIN,TX-MINERALS) tablet Take 1 tablet by mouth daily.   nystatin (MYCOSTATIN/NYSTOP) powder Apply 1 application topically 2 (two) times daily as needed.   pregabalin (LYRICA) 100 MG capsule Take 1 capsule (100 mg total) by mouth 3 (three) times daily.   SODIUM FLUORIDE 5000 SENSITIVE 1.1-5 % GEL Take by mouth at bedtime.   traMADol (ULTRAM) 50 MG tablet Take 1 tablet by mouth every 6 (six) hours as needed.   No facility-administered encounter medications on file as of 07/28/2020.    Pharmacist Review  Have you had any problems recently with your health? Patient stated that she still dealing with pain in her ear, but she is taking duloxetine and pregabalin which is helping  with the pain.   Have you had any problems with your pharmacy? Patient states that she does not have any problems with getting medication from the pharmacy or the cost of medication  What issues or side effects are you having with your medications? Patient states that she can't concentrate when she walks into a room, she feels tired and sleepy when she takes duloxetine and pregabalin. She states that when she takes them she stays at home  What would you like me to pass along to [facility] for them to help you with?  Patient states that there is nothing she needs to pass along, Dr. Quay Burow knows she has been dealing with the issue for a while.  What can we do to take care of you better?  Patient states not at this time  Star Rating Drugs:  [place] Pharmacist Assistant [phone]   Time spent:29

## 2020-08-07 NOTE — Progress Notes (Signed)
    Chronic Care Management Pharmacy Assistant   Name: ANARA MARSTON  MRN: [id number] DOB: [birth year]   Reason for Encounter: Chart Review   Pharmacist Review Reviewed chart for medication changes and adherence.  No OVs, Consults, or hospital visits since last care coordination call / Pharmacist visit. No medication changes indicated  No gaps in adherence identified. Patient has follow up scheduled with pharmacy team. No further action required.   Ethelene Hal Clinical Pharmacist Assistant [phone]   Time spent:10

## 2020-08-25 ENCOUNTER — Telehealth: Payer: PPO

## 2020-08-26 DIAGNOSIS — Z1231 Encounter for screening mammogram for malignant neoplasm of breast: Secondary | ICD-10-CM | POA: Diagnosis not present

## 2020-08-26 LAB — HM MAMMOGRAPHY

## 2020-09-22 ENCOUNTER — Other Ambulatory Visit: Payer: Self-pay | Admitting: Internal Medicine

## 2020-09-23 ENCOUNTER — Other Ambulatory Visit: Payer: Self-pay | Admitting: Internal Medicine

## 2020-09-24 ENCOUNTER — Other Ambulatory Visit: Payer: Self-pay | Admitting: Internal Medicine

## 2020-09-30 ENCOUNTER — Other Ambulatory Visit: Payer: Self-pay | Admitting: Internal Medicine

## 2020-10-01 ENCOUNTER — Encounter: Payer: Self-pay | Admitting: Internal Medicine

## 2020-10-01 MED ORDER — DULOXETINE HCL 20 MG PO CPEP: 20.0000 mg | ORAL_CAPSULE | Freq: Every day | ORAL | 0 refills | Status: DC

## 2020-10-07 DIAGNOSIS — H40013 Open angle with borderline findings, low risk, bilateral: Secondary | ICD-10-CM | POA: Diagnosis not present

## 2020-10-07 DIAGNOSIS — H04123 Dry eye syndrome of bilateral lacrimal glands: Secondary | ICD-10-CM | POA: Diagnosis not present

## 2020-10-07 DIAGNOSIS — H40033 Anatomical narrow angle, bilateral: Secondary | ICD-10-CM | POA: Diagnosis not present

## 2020-10-28 ENCOUNTER — Other Ambulatory Visit: Payer: Self-pay | Admitting: Internal Medicine

## 2020-11-03 ENCOUNTER — Encounter: Payer: Self-pay | Admitting: Internal Medicine

## 2020-11-03 NOTE — Progress Notes (Signed)
Outside notes received. Information abstracted. Notes sent to scan.  

## 2020-12-17 DIAGNOSIS — K2960 Other gastritis without bleeding: Secondary | ICD-10-CM | POA: Diagnosis not present
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
## 2020-12-21 ENCOUNTER — Encounter: Payer: Self-pay | Admitting: Internal Medicine

## 2020-12-21 ENCOUNTER — Other Ambulatory Visit: Payer: Self-pay

## 2020-12-21 ENCOUNTER — Ambulatory Visit (INDEPENDENT_AMBULATORY_CARE_PROVIDER_SITE_OTHER): Payer: PPO | Admitting: Internal Medicine

## 2020-12-21 VITALS — BP 128/84 | HR 90 | Temp 98.1°F | Ht 60.0 in | Wt 181.0 lb

## 2020-12-21 DIAGNOSIS — K219 Gastro-esophageal reflux disease without esophagitis: Secondary | ICD-10-CM

## 2020-12-21 DIAGNOSIS — R739 Hyperglycemia, unspecified: Secondary | ICD-10-CM

## 2020-12-21 LAB — COMPREHENSIVE METABOLIC PANEL
ALT: 9 U/L (ref 0–35)
AST: 12 U/L (ref 0–37)
Albumin: 4.2 g/dL (ref 3.5–5.2)
Alkaline Phosphatase: 106 U/L (ref 39–117)
BUN: 7 mg/dL (ref 6–23)
CO2: 28 mEq/L (ref 19–32)
Calcium: 10 mg/dL (ref 8.4–10.5)
Chloride: 101 mEq/L (ref 96–112)
Creatinine, Ser: 0.63 mg/dL (ref 0.40–1.20)
GFR: 91.36 mL/min (ref >60.00)
Glucose, Bld: 93 mg/dL (ref 70–99)
Potassium: 4.2 mEq/L (ref 3.5–5.1)
Sodium: 136 mEq/L (ref 135–145)
Total Bilirubin: 0.7 mg/dL (ref 0.2–1.2)
Total Protein: 7.7 g/dL (ref 6.0–8.3)

## 2020-12-21 LAB — CBC WITH DIFFERENTIAL/PLATELET
Basophils Absolute: 0.1 10*3/uL (ref 0.0–0.1)
Basophils Relative: 1.7 % (ref 0.0–3.0)
Eosinophils Absolute: 0.1 10*3/uL (ref 0.0–0.7)
Eosinophils Relative: 2.3 % (ref 0.0–5.0)
HCT: 41.6 % (ref 36.0–46.0)
Hemoglobin: 13.9 g/dL (ref 12.0–15.0)
Lymphocytes Relative: 39.8 % (ref 12.0–46.0)
Lymphs Abs: 2.3 10*3/uL (ref 0.7–4.0)
MCHC: 33.3 g/dL (ref 30.0–36.0)
MCV: 90.7 fl (ref 78.0–100.0)
Monocytes Absolute: 0.5 10*3/uL (ref 0.1–1.0)
Monocytes Relative: 8.4 % (ref 3.0–12.0)
Neutro Abs: 2.7 10*3/uL (ref 1.4–7.7)
Neutrophils Relative %: 47.8 % (ref 43.0–77.0)
Platelets: 357 10*3/uL (ref 150.0–400.0)
RBC: 4.59 Mil/uL (ref 3.87–5.11)
RDW: 13.4 % (ref 11.5–15.5)
WBC: 5.7 10*3/uL (ref 4.0–10.5)

## 2020-12-21 LAB — HEMOGLOBIN A1C: Hgb A1c MFr Bld: 5.5 % (ref 4.6–6.5)

## 2020-12-21 MED ORDER — ESOMEPRAZOLE MAGNESIUM 20 MG PO CPDR: 20.0000 mg | DELAYED_RELEASE_CAPSULE | Freq: Every day | ORAL | 5 refills | Status: DC

## 2020-12-21 NOTE — Assessment & Plan Note (Addendum)
Acute on chronic She has had heartburn for years, but has been able to control it for the most part-last week heartburn flared and at one point was severe She has tried some natural things without much improvement Several medications she has tried in the past that have either not been effective or she did not tolerate including Zantac, Pepcid, omeprazole, pantoprazole and Dexilant Can continue Pepto-Bismol since that seems to help-she is aware that will cause the stool to be black We will try Nexium 20 mg daily-she understands this could cause similar side effects to medications she was tried before, but we need to try something Check CBC Advised weight loss Revise diet She will update me on her symptoms

## 2020-12-21 NOTE — Patient Instructions (Addendum)
    Blood work was ordered.      Medications changes include :   nexium 20 mg daily -take 30 minutes prior to a meal    Your prescription(s) have been submitted to your pharmacy. Please take as directed and contact our office if you believe you are having problem(s) with the medication(s).

## 2020-12-21 NOTE — Assessment & Plan Note (Signed)
Chronic She has noticed some increased urination and thirst Will check CMP, A1c, but doubt that her sugars have increased enough to be in the diabetic range

## 2020-12-24 ENCOUNTER — Encounter: Payer: Self-pay | Admitting: Internal Medicine

## 2020-12-31 ENCOUNTER — Encounter: Payer: Self-pay | Admitting: Internal Medicine

## 2021-01-18 DIAGNOSIS — M1711 Unilateral primary osteoarthritis, right knee: Secondary | ICD-10-CM | POA: Diagnosis not present

## 2021-01-26 ENCOUNTER — Encounter: Payer: Self-pay | Admitting: Gastroenterology

## 2021-01-26 ENCOUNTER — Ambulatory Visit: Payer: PPO | Admitting: Gastroenterology

## 2021-01-26 VITALS — BP 116/70 | HR 85 | Ht 60.0 in | Wt 183.0 lb

## 2021-01-26 DIAGNOSIS — K2100 Gastro-esophageal reflux disease with esophagitis, without bleeding: Secondary | ICD-10-CM

## 2021-01-26 DIAGNOSIS — R1319 Other dysphagia: Secondary | ICD-10-CM

## 2021-01-26 DIAGNOSIS — K222 Esophageal obstruction: Secondary | ICD-10-CM

## 2021-01-26 NOTE — Progress Notes (Signed)
Colleen Walsh GI Progress Note  Chief Complaint: GERD and dysphagia  Subjective  History:  Seen in 2017 for symptoms described as "reflux" with concomitant dysphagia.  No stricture found on EGD, subsequent normal esophageal manometry.  She is intolerant of multiple PPIs causing a variety of side effects. Last upper endoscopy January 2019 for dysphagia revealed significant distal esophageal stricture, balloon dilation performed.  Patient then advised to be on ranitidine 300 mg twice daily (prior to this medicine being removed from the market).  Colon 09/2019 - FHx CRC, nml exam - 5 yr recall  Colleen Walsh started having a flare of her reflux symptoms in early December.  She saw her PCP Dr. Quay Burow and was put on low-dose Nexium with slowly increasing dosing and frequency that has not been helping.  She describes a burning sensation starting in her lower abdomen and rising up into the esophagus.  When it occurs, she feels it makes her blood pressure elevated because it causes dry eyes, burning in her ears and a feeling of warmth.  She has recurrent dysphagia mostly to bread and meat.  No acid suppression medicines have helped her much, she gets the most relief from fresh squeezed lemon juice with pulp.  ROS: Cardiovascular:  no chest pain Respiratory: no dyspnea Remainder of systems negative except as above. The patient's Past Medical, Family and Social History were reviewed and are on file in the EMR.  Objective:  Med list reviewed  Current Outpatient Medications:    amLODipine (NORVASC) 5 MG tablet, Take 1 tablet by mouth once daily, Disp: 90 tablet, Rfl: 0   cyclobenzaprine (FLEXERIL) 10 MG tablet, TAKE 1/2 TO 1 (ONE-HALF TO ONE) TABLET BY MOUTH THREE TIMES DAILY AS NEEDED FOR MUSCLE SPASM, Disp: 60 tablet, Rfl: 0   esomeprazole (NEXIUM) 40 MG capsule, Take 40 mg by mouth daily at 12 noon., Disp: , Rfl:    pregabalin (LYRICA) 100 MG capsule, Take 1 capsule (100 mg total) by mouth 3  (three) times daily. (Patient taking differently: Take 100 mg by mouth daily.), Disp: 90 capsule, Rfl: 5   pregabalin (LYRICA) 100 MG capsule, Take 100 mg by mouth at bedtime., Disp: , Rfl:    SODIUM FLUORIDE 5000 SENSITIVE 1.1-5 % GEL, Take by mouth at bedtime., Disp: , Rfl:    Ascorbic Acid (VITAMIN C) 1000 MG tablet, Take 1,000 mg by mouth daily. (Patient not taking: Reported on 01/26/2021), Disp: , Rfl:    Calcium-Vitamin D (CALTRATE 600 PLUS-VIT D PO), Take 1 each by mouth daily. (Patient not taking: Reported on 01/26/2021), Disp: , Rfl:    Copper 5 MG CAPS, Take by mouth. (Patient not taking: Reported on 01/26/2021), Disp: , Rfl:    Multiple Vitamins-Minerals (MULTIVITAMIN,TX-MINERALS) tablet, Take 1 tablet by mouth daily. (Patient not taking: Reported on 01/26/2021), Disp: , Rfl:    Vital signs in last 24 hrs: Vitals:   01/26/21 1053  BP: 116/70  Pulse: 85   Wt Readings from Last 3 Encounters:  01/26/21 183 lb (83 kg)  12/21/20 181 lb (82.1 kg)  07/20/20 182 lb (82.6 kg)    Physical Exam  Well-appearing, normal vocal quality HEENT: sclera anicteric, oral mucosa moist without lesions Neck: supple, no thyromegaly, JVD or lymphadenopathy Cardiac: RRR without murmurs, S1S2 heard, no peripheral edema Pulm: clear to auscultation bilaterally, normal RR and effort noted Abdomen: soft, no tenderness, with active bowel sounds. No guarding or palpable hepatosplenomegaly. Skin; warm and dry, no jaundice or rash  Labs:  No  recent data for review  _____________________________________________ Assessment & Plan  Assessment: Encounter Diagnoses  Name Primary?   Gastroesophageal reflux disease with esophagitis without hemorrhage Yes   Esophageal stricture    Esophageal dysphagia    Recent flare of chronic reflux symptoms (going on many years), recurrent dysphagia with a known history of esophageal stricture several years ago.  She has been intolerant to reported side effects of multiple  PPIs, and lately another trial of PPI has not improved her symptoms. I recommended a repeat upper endoscopy with possible balloon dilation of stricture is found.  If she has similar findings to before, not withstanding some atypical aspects of her symptom profile, she would likely need further testing to determine candidacy for TIF or surgical fundoplication for long-term control of her reflux.  EGD will also help evaluate whether her previously 1 to 2 cm hiatal hernia has changed at all in size, and there are for whether she may be a candidate for TIF.  She was agreeable after discussion of procedure and risks.  The benefits and risks of the planned procedure were described in detail with the patient or (when appropriate) their health care proxy.  Risks were outlined as including, but not limited to, bleeding, infection, perforation, adverse medication reaction leading to cardiac or pulmonary decompensation, pancreatitis (if ERCP).  The limitation of incomplete mucosal visualization was also discussed.  No guarantees or warranties were given.   Colleen Walsh

## 2021-01-26 NOTE — Patient Instructions (Signed)
If you are age 69 or older, your body mass index should be between 23-30. Your Body mass index is 35.74 kg/m. If this is out of the aforementioned range listed, please consider follow up with your Primary Care Provider.  If you are age 81 or younger, your body mass index should be between 19-25. Your Body mass index is 35.74 kg/m. If this is out of the aformentioned range listed, please consider follow up with your Primary Care Provider.   ________________________________________________________  The [facility] providers would like to encourage you to use [facility] to communicate with providers for non-urgent requests or questions.  Due to long hold times on the telephone, sending your provider a message by [facility] may be a faster and more efficient way to get a response.  Please allow 48 business hours for a response.  Please remember that this is for non-urgent requests.  _______________________________________________________  Colleen Walsh have been scheduled for an endoscopy. Please follow written instructions given to you at your visit today. If you use inhalers (even only as needed), please bring them with you on the day of your procedure.  Due to recent changes in healthcare laws, you may see the results of your imaging and laboratory studies on MyChart before your provider has had a chance to review them.  We understand that in some cases there may be results that are confusing or concerning to you. Not all laboratory results come back in the same time frame and the provider may be waiting for multiple results in order to interpret others.  Please give [place] 48 hours in order for your provider to thoroughly review all the results before contacting the office for clarification of your results.    It was a pleasure to see you today!  Thank you for trusting me with your gastrointestinal care!

## 2021-02-04 ENCOUNTER — Ambulatory Visit (AMBULATORY_SURGERY_CENTER): Payer: PPO | Admitting: Gastroenterology

## 2021-02-04 ENCOUNTER — Other Ambulatory Visit: Payer: Self-pay

## 2021-02-04 ENCOUNTER — Encounter: Payer: Self-pay | Admitting: Gastroenterology

## 2021-02-04 VITALS — BP 114/71 | HR 74 | Temp 97.7°F | Resp 11 | Ht 60.0 in | Wt 183.0 lb

## 2021-02-04 DIAGNOSIS — K222 Esophageal obstruction: Secondary | ICD-10-CM

## 2021-02-04 DIAGNOSIS — R1319 Other dysphagia: Secondary | ICD-10-CM | POA: Diagnosis not present

## 2021-02-04 DIAGNOSIS — K449 Diaphragmatic hernia without obstruction or gangrene: Secondary | ICD-10-CM

## 2021-02-04 DIAGNOSIS — K2100 Gastro-esophageal reflux disease with esophagitis, without bleeding: Secondary | ICD-10-CM

## 2021-02-04 DIAGNOSIS — R1310 Dysphagia, unspecified: Secondary | ICD-10-CM | POA: Diagnosis not present

## 2021-02-04 MED ORDER — SODIUM CHLORIDE 0.9 % IV SOLN: 500.0000 mL | Freq: Once | INTRAVENOUS | Status: DC

## 2021-02-04 NOTE — Progress Notes (Signed)
Called to room to assist during endoscopic procedure.  Patient ID and intended procedure confirmed with present staff. Received instructions for my participation in the procedure from the performing physician.  

## 2021-02-04 NOTE — Patient Instructions (Signed)
HANDOUTS PROVIDED ON: HIATAL HERNIA  You may resume your previous diet and medication schedule.  Thank you for allowing [place] to care for you today!!!   YOU HAD AN ENDOSCOPIC PROCEDURE TODAY AT [place]:   Refer to the procedure report that was given to you for any specific questions about what was found during the examination.  If the procedure report does not answer your questions, please call your gastroenterologist to clarify.  If you requested that your care partner not be given the details of your procedure findings, then the procedure report has been included in a sealed envelope for you to review at your convenience later.  YOU SHOULD EXPECT: Some feelings of bloating in the abdomen. Passage of more gas than usual.  Walking can help get rid of the air that was put into your GI tract during the procedure and reduce the bloating.   Please Note:  You might notice some irritation and congestion in your nose or some drainage.  This is from the oxygen used during your procedure.  There is no need for concern and it should clear up in a day or so.  SYMPTOMS TO REPORT IMMEDIATELY:  Following upper endoscopy (EGD)  Vomiting of blood or coffee ground material  New chest pain or pain under the shoulder blades  Painful or persistently difficult swallowing  New shortness of breath  Fever of 100F or higher  Black, tarry-looking stools  For urgent or emergent issues, a gastroenterologist can be reached at any hour by calling [phone]. Do not use MyChart messaging for urgent concerns.    DIET:  We do recommend a small meal at first, but then you may proceed to your regular diet.  Drink plenty of fluids but you should avoid alcoholic beverages for 24 hours.  ACTIVITY:  You should plan to take it easy for the rest of today and you should NOT DRIVE or use heavy machinery until tomorrow (because of the sedation medicines used during the test).    FOLLOW UP: Our staff will  call the number listed on your records Monday morning between 7:15 am and 8:15 am following your procedure to check on you and address any questions or concerns that you may have regarding the information given to you following your procedure. If we do not reach you, we will leave a message.  We will attempt to reach you two times.  During this call, we will ask if you have developed any symptoms of COVID 19. If you develop any symptoms (ie: fever, flu-like symptoms, shortness of breath, cough etc.) before then, please call [phone].  If you test positive for Covid 19 in the 2 weeks post procedure, please call and report this information to [place].    If any biopsies were taken you will be contacted by phone or by letter within the next 1-3 weeks.  Please call us at [phone] if you have not heard about the biopsies in 3 weeks.    SIGNATURES/CONFIDENTIALITY: You and/or your care partner have signed paperwork which will be entered into your electronic medical record.  These signatures attest to the fact that that the information above on your After Visit Summary has been reviewed and is understood.  Full responsibility of the confidentiality of this discharge information lies with you and/or your care-partner.

## 2021-02-04 NOTE — Op Note (Signed)
[place] Patient Name: Colleen Walsh Procedure Date: 02/04/2021 9:40 AM MRN: [id number] Endoscopist: [place]. Loletha Carrow , MD Age: 71 Referring MD:  Date of Birth: 29-Sep-1950 Gender: Female Account #: [id number] Procedure:                Upper GI endoscopy Indications:              Esophageal dysphagia, Esophageal reflux symptoms                            that persist despite appropriate therapy, Suspected                            stricture of the esophagus Medicines:                Monitored Anesthesia Care Procedure:                Pre-Anesthesia Assessment:                           - Prior to the procedure, a History and Physical                            was performed, and patient medications and                            allergies were reviewed. The patient's tolerance of                            previous anesthesia was also reviewed. The risks                            and benefits of the procedure and the sedation                            options and risks were discussed with the patient.                            All questions were answered, and informed consent                            was obtained. Prior Anticoagulants: The patient has                            taken no previous anticoagulant or antiplatelet                            agents. ASA Grade Assessment: II - A patient with                            mild systemic disease. After reviewing the risks                            and benefits, the patient was deemed in  satisfactory condition to undergo the procedure.                           After obtaining informed consent, the endoscope was                            passed under direct vision. Throughout the                            procedure, the patient's blood pressure, pulse, and                            oxygen saturations were monitored continuously. The                            Olympus [id number]-[id number]  [id number] was introduced through                            the mouth, and advanced to the second part of                            duodenum. The upper GI endoscopy was accomplished                            without difficulty. The patient tolerated the                            procedure well. Scope In: Scope Out: Findings:                 A 1-2 cm sliding hiatal hernia was present.                           One benign-appearing, intrinsic mild stenosis was                            found at the gastroesophageal junction. This                            stenosis measured approximately 15 mm (inner                            diameter) x less than one cm (in length). The                            stenosis was traversed. The dilation site was                            examined following endoscope reinsertion and showed                            mild mucosal disruption and minimal improvement in                            luminal narrowing.  There is no endoscopic evidence of Barrett's                            esophagus or esophagitis in the entire esophagus.                           The stomach was normal.                           The cardia and gastric fundus were normal on                            retroflexion. (Hill grade 3)                           The examined duodenum was normal. Complications:            No immediate complications. Estimated Blood Loss:     Estimated blood loss was minimal. Impression:               - 1-2 cm sliding hiatal hernia.                           - Benign-appearing esophageal stenosis.                           - Normal stomach.                           - Normal examined duodenum.                           - No specimens collected. Recommendation:           - Patient has a contact number available for                            emergencies. The signs and symptoms of potential                            delayed  complications were discussed with the                            patient. Return to normal activities tomorrow.                            Written discharge instructions were provided to the                            patient.                           - Resume previous diet.                           - Continue present medications. Esomeprazole not  completely controlling symptoms, but it is                            preventing the esophagitis and more significant                            stricture seen on 2019 EGD.                           - Consider further testing (pH/impedance and                            esophageal manometry) if symptoms affecting quality                            of life enough to consider fundoplication (TIF or                            surgical) and hiatal hernia repair. Chestina Komatsu L. Loletha Carrow, MD 02/04/2021 10:09:05 AM This report has been signed electronically.

## 2021-02-04 NOTE — Progress Notes (Signed)
No changes to clinical history since GI office visit on 01/26/21.  The patient is appropriate for an endoscopic procedure in the ambulatory setting.

## 2021-02-04 NOTE — Progress Notes (Signed)
Pt non-responsive, VVS, Report to RN  °

## 2021-02-08 ENCOUNTER — Telehealth: Payer: Self-pay | Admitting: *Deleted

## 2021-02-08 ENCOUNTER — Telehealth: Payer: Self-pay

## 2021-02-08 NOTE — Telephone Encounter (Signed)
Left message on follow up call. 

## 2021-02-08 NOTE — Telephone Encounter (Signed)
No answer for post procedure call back. Left VM. 

## 2021-02-15 DIAGNOSIS — M1711 Unilateral primary osteoarthritis, right knee: Secondary | ICD-10-CM | POA: Diagnosis not present

## 2021-03-08 ENCOUNTER — Ambulatory Visit: Payer: PPO | Admitting: Obstetrics & Gynecology

## 2021-03-18 ENCOUNTER — Other Ambulatory Visit: Payer: Self-pay | Admitting: Internal Medicine

## 2021-03-22 ENCOUNTER — Other Ambulatory Visit: Payer: Self-pay | Admitting: Internal Medicine

## 2021-04-12 ENCOUNTER — Ambulatory Visit: Payer: PPO | Admitting: Obstetrics & Gynecology

## 2021-04-23 ENCOUNTER — Telehealth: Payer: Self-pay | Admitting: Internal Medicine

## 2021-04-23 NOTE — Telephone Encounter (Signed)
N/A unable to leave a message for patient to call back to schedule Medicare Annual Wellness Visit  ? ?Last AWV  04/27/20 ? ?Please schedule at anytime with [facility] if patient calls the office back.   ? ? ?Any questions, please call me at [phone]  ?

## 2021-04-28 ENCOUNTER — Telehealth: Payer: PPO

## 2021-05-14 DIAGNOSIS — H40013 Open angle with borderline findings, low risk, bilateral: Secondary | ICD-10-CM | POA: Diagnosis not present

## 2021-05-14 DIAGNOSIS — H25813 Combined forms of age-related cataract, bilateral: Secondary | ICD-10-CM | POA: Diagnosis not present

## 2021-05-14 DIAGNOSIS — H40031 Anatomical narrow angle, right eye: Secondary | ICD-10-CM | POA: Diagnosis not present

## 2021-05-14 DIAGNOSIS — H04123 Dry eye syndrome of bilateral lacrimal glands: Secondary | ICD-10-CM | POA: Diagnosis not present

## 2021-05-14 DIAGNOSIS — H40033 Anatomical narrow angle, bilateral: Secondary | ICD-10-CM | POA: Diagnosis not present

## 2021-05-22 ENCOUNTER — Other Ambulatory Visit: Payer: Self-pay | Admitting: Internal Medicine

## 2021-06-03 DIAGNOSIS — H40032 Anatomical narrow angle, left eye: Secondary | ICD-10-CM | POA: Diagnosis not present

## 2021-06-21 DIAGNOSIS — H40013 Open angle with borderline findings, low risk, bilateral: Secondary | ICD-10-CM | POA: Diagnosis not present

## 2021-06-21 DIAGNOSIS — H40033 Anatomical narrow angle, bilateral: Secondary | ICD-10-CM | POA: Diagnosis not present

## 2021-06-25 DIAGNOSIS — R632 Polyphagia: Secondary | ICD-10-CM | POA: Diagnosis not present

## 2021-06-25 DIAGNOSIS — Z131 Encounter for screening for diabetes mellitus: Secondary | ICD-10-CM | POA: Diagnosis not present

## 2021-06-25 DIAGNOSIS — E8881 Metabolic syndrome: Secondary | ICD-10-CM | POA: Diagnosis not present

## 2021-06-25 DIAGNOSIS — R635 Abnormal weight gain: Secondary | ICD-10-CM | POA: Diagnosis not present

## 2021-06-25 DIAGNOSIS — Z79899 Other long term (current) drug therapy: Secondary | ICD-10-CM | POA: Diagnosis not present

## 2021-06-25 DIAGNOSIS — E559 Vitamin D deficiency, unspecified: Secondary | ICD-10-CM | POA: Diagnosis not present

## 2021-06-25 DIAGNOSIS — R0602 Shortness of breath: Secondary | ICD-10-CM | POA: Diagnosis not present

## 2021-06-25 DIAGNOSIS — E7800 Pure hypercholesterolemia, unspecified: Secondary | ICD-10-CM | POA: Diagnosis not present

## 2021-07-29 ENCOUNTER — Other Ambulatory Visit: Payer: Self-pay | Admitting: Internal Medicine

## 2021-08-30 DIAGNOSIS — Z1231 Encounter for screening mammogram for malignant neoplasm of breast: Secondary | ICD-10-CM | POA: Diagnosis not present

## 2021-08-31 ENCOUNTER — Encounter: Payer: Self-pay | Admitting: Obstetrics & Gynecology

## 2021-08-31 LAB — HM MAMMOGRAPHY

## 2021-09-01 ENCOUNTER — Other Ambulatory Visit: Payer: Self-pay | Admitting: Internal Medicine

## 2021-09-02 ENCOUNTER — Encounter: Payer: Self-pay | Admitting: Internal Medicine

## 2021-09-02 NOTE — Progress Notes (Signed)
Outside notes received. Information abstracted. Notes sent to scan.  

## 2021-09-03 ENCOUNTER — Other Ambulatory Visit: Payer: Self-pay | Admitting: Internal Medicine

## 2021-09-09 ENCOUNTER — Encounter: Payer: Self-pay | Admitting: Internal Medicine

## 2021-09-09 NOTE — Patient Instructions (Addendum)
Blood work was ordered.     Medications changes include :   none    Your prescription(s) have been sent to your pharmacy.     Return in about 1 year (around 09/11/2022) for Physical Exam.   Health Maintenance, Female Adopting a healthy lifestyle and getting preventive care are important in promoting health and wellness. Ask your health care provider about: The right schedule for you to have regular tests and exams. Things you can do on your own to prevent diseases and keep yourself healthy. What should I know about diet, weight, and exercise? Eat a healthy diet  Eat a diet that includes plenty of vegetables, fruits, low-fat dairy products, and lean protein. Do not eat a lot of foods that are high in solid fats, added sugars, or sodium. Maintain a healthy weight Body mass index (BMI) is used to identify weight problems. It estimates body fat based on height and weight. Your health care provider can help determine your BMI and help you achieve or maintain a healthy weight. Get regular exercise Get regular exercise. This is one of the most important things you can do for your health. Most adults should: Exercise for at least 150 minutes each week. The exercise should increase your heart rate and make you sweat (moderate-intensity exercise). Do strengthening exercises at least twice a week. This is in addition to the moderate-intensity exercise. Spend less time sitting. Even light physical activity can be beneficial. Watch cholesterol and blood lipids Have your blood tested for lipids and cholesterol at 69 years of age, then have this test every 5 years. Have your cholesterol levels checked more often if: Your lipid or cholesterol levels are high. You are older than 69 years of age. You are at high risk for heart disease. What should I know about cancer screening? Depending on your health history and family history, you may need to have cancer screening at various ages. This  may include screening for: Breast cancer. Cervical cancer. Colorectal cancer. Skin cancer. Lung cancer. What should I know about heart disease, diabetes, and high blood pressure? Blood pressure and heart disease High blood pressure causes heart disease and increases the risk of stroke. This is more likely to develop in people who have high blood pressure readings or are overweight. Have your blood pressure checked: Every 3-5 years if you are 76-27 years of age. Every year if you are 63 years old or older. Diabetes Have regular diabetes screenings. This checks your fasting blood sugar level. Have the screening done: Once every three years after age 71 if you are at a normal weight and have a low risk for diabetes. More often and at a younger age if you are overweight or have a high risk for diabetes. What should I know about preventing infection? Hepatitis B If you have a higher risk for hepatitis B, you should be screened for this virus. Talk with your health care provider to find out if you are at risk for hepatitis B infection. Hepatitis C Testing is recommended for: Everyone born from [birth year] through 1963. Anyone with known risk factors for hepatitis C. Sexually transmitted infections (STIs) Get screened for STIs, including gonorrhea and chlamydia, if: You are sexually active and are younger than 69 years of age. You are older than 69 years of age and your health care provider tells you that you are at risk for this type of infection. Your sexual activity has changed since you were last screened, and you  are at increased risk for chlamydia or gonorrhea. Ask your health care provider if you are at risk. Ask your health care provider about whether you are at high risk for HIV. Your health care provider may recommend a prescription medicine to help prevent HIV infection. If you choose to take medicine to prevent HIV, you should first get tested for HIV. You should then be tested every 3  months for as long as you are taking the medicine. Pregnancy If you are about to stop having your period (premenopausal) and you may become pregnant, seek counseling before you get pregnant. Take 400 to 800 micrograms (mcg) of folic acid every day if you become pregnant. Ask for birth control (contraception) if you want to prevent pregnancy. Osteoporosis and menopause Osteoporosis is a disease in which the bones lose minerals and strength with aging. This can result in bone fractures. If you are 21 years old or older, or if you are at risk for osteoporosis and fractures, ask your health care provider if you should: Be screened for bone loss. Take a calcium or vitamin D supplement to lower your risk of fractures. Be given hormone replacement therapy (HRT) to treat symptoms of menopause. Follow these instructions at home: Alcohol use Do not drink alcohol if: Your health care provider tells you not to drink. You are pregnant, may be pregnant, or are planning to become pregnant. If you drink alcohol: Limit how much you have to: 0-1 drink a day. Know how much alcohol is in your drink. In the [place] one drink equals one 12 oz bottle of beer (355 mL), one 5 oz glass of wine (148 mL), or one 1 oz glass of hard liquor (44 mL). Lifestyle Do not use any products that contain nicotine or tobacco. These products include cigarettes, chewing tobacco, and vaping devices, such as e-cigarettes. If you need help quitting, ask your health care provider. Do not use street drugs. Do not share needles. Ask your health care provider for help if you need support or information about quitting drugs. General instructions Schedule regular health, dental, and eye exams. Stay current with your vaccines. Tell your health care provider if: You often feel depressed. You have ever been abused or do not feel safe at home. Summary Adopting a healthy lifestyle and getting preventive care are important in promoting health  and wellness. Follow your health care provider's instructions about healthy diet, exercising, and getting tested or screened for diseases. Follow your health care provider's instructions on monitoring your cholesterol and blood pressure. This information is not intended to replace advice given to you by your health care provider. Make sure you discuss any questions you have with your health care provider. Document Revised: 05/18/2020 Document Reviewed: 05/18/2020 Elsevier Patient Education  [place].

## 2021-09-09 NOTE — Progress Notes (Signed)
Subjective:    Patient ID: Colleen Walsh, female    DOB: 06/21/1950, 71 y.o.   MRN: [id number]      HPI Colleen Walsh is here for a Physical exam.    Colleen Walsh fell in January - landed on her right knee - reinjured it.  Colleen Walsh flared her OA.  Since then Colleen Walsh has had her flares of pain intermittently.  Colleen Walsh wears a brace.  Colleen Walsh is not currently exercising.  Colleen Walsh does follow with orthopedics.    Medications and allergies reviewed with patient and updated if appropriate.  Current Outpatient Medications on File Prior to Visit  Medication Sig Dispense Refill   amLODipine (NORVASC) 5 MG tablet Take 1 tablet by mouth once daily 90 tablet 0   No current facility-administered medications on file prior to visit.    Review of Systems  Constitutional:  Negative for fever.  Eyes:  Negative for visual disturbance.  Respiratory:  Negative for cough, shortness of breath and wheezing.   Cardiovascular:  Negative for chest pain, palpitations and leg swelling.  Gastrointestinal:  Negative for abdominal pain, blood in stool, constipation, diarrhea and nausea.       Occ gerd  Genitourinary:  Negative for dysuria.  Musculoskeletal:  Positive for arthralgias (right knee pain) and neck pain. Negative for back pain.  Skin:  Negative for rash.  Neurological:  Negative for light-headedness and headaches.  Psychiatric/Behavioral:  Negative for dysphoric mood. The patient is not nervous/anxious.        Objective:   Vitals:   09/10/21 1340  BP: 118/80  Pulse: 89  Temp: 98.2 F (36.8 C)  SpO2: 97%   Filed Weights   09/10/21 1340  Weight: 183 lb (83 kg)   Body mass index is 35.74 kg/m.  BP Readings from Last 3 Encounters:  09/10/21 118/80  02/04/21 114/71  01/26/21 116/70    Wt Readings from Last 3 Encounters:  09/10/21 183 lb (83 kg)  02/04/21 183 lb (83 kg)  01/26/21 183 lb (83 kg)       Physical Exam Constitutional: Colleen Walsh appears well-developed and well-nourished. No distress.   HENT:  Head: Normocephalic and atraumatic.  Right Ear: External ear normal. Normal ear canal and TM Left Ear: External ear normal.  Normal ear canal and TM Mouth/Throat: Oropharynx is clear and moist.  Eyes: Conjunctivae normal.  Neck: Neck supple. No tracheal deviation present. No thyromegaly present.  No carotid bruit  Cardiovascular: Normal rate, regular rhythm and normal heart sounds.   No murmur heard.  No edema. Pulmonary/Chest: Effort normal and breath sounds normal. No respiratory distress. Colleen Walsh has no wheezes. Colleen Walsh has no rales.  Breast: deferred   Abdominal: Soft. Colleen Walsh exhibits no distension. There is no tenderness.  Lymphadenopathy: Colleen Walsh has no cervical adenopathy.  Skin: Skin is warm and dry. Colleen Walsh is not diaphoretic.  Psychiatric: Colleen Walsh has a normal mood and affect. Her behavior is normal.     Lab Results  Component Value Date   WBC 5.7 12/21/2020   HGB 13.9 12/21/2020   HCT 41.6 12/21/2020   PLT 357.0 12/21/2020   GLUCOSE 93 12/21/2020   CHOL 245 (H) 07/20/2020   TRIG 158.0 (H) 07/20/2020   HDL 46.60 07/20/2020   LDLDIRECT 122.0 07/17/2019   LDLCALC 166 (H) 07/20/2020   ALT 9 12/21/2020   AST 12 12/21/2020   NA 136 12/21/2020   K 4.2 12/21/2020   CL 101 12/21/2020   CREATININE 0.63 12/21/2020   BUN 7 12/21/2020  CO2 28 12/21/2020   TSH 0.99 07/20/2020   HGBA1C 5.5 12/21/2020         Assessment & Plan:   Physical exam: Screening blood work  ordered Exercise  none - advised regular exercise-Colleen Walsh has an exercise bike at home and should start using that to help strengthen up her leg muscles Weight  encouraged weight loss Substance abuse  none   Reviewed recommended immunizations.   Health Maintenance  Topic Date Due   COVID-19 Vaccine (4 - Pfizer risk series) 09/26/2021 (Originally 02/21/2020)   INFLUENZA VACCINE  04/10/2022 (Originally 08/10/2021)   DEXA SCAN  04/22/2022   MAMMOGRAM  09/01/2023   TETANUS/TDAP  12/11/2023   COLONOSCOPY (Pts 45-95yr  Insurance coverage will need to be confirmed)  09/11/2024   Pneumonia Vaccine 69 Years old  Completed   Hepatitis C Screening  Completed   Zoster Vaccines- Shingrix  Completed   HPV VACCINES  Aged Out          See Problem List for Assessment and Plan of chronic medical problems.

## 2021-09-10 ENCOUNTER — Ambulatory Visit (INDEPENDENT_AMBULATORY_CARE_PROVIDER_SITE_OTHER): Payer: PPO | Admitting: Internal Medicine

## 2021-09-10 VITALS — BP 118/80 | HR 89 | Temp 98.2°F | Wt 183.0 lb

## 2021-09-10 DIAGNOSIS — B0229 Other postherpetic nervous system involvement: Secondary | ICD-10-CM

## 2021-09-10 DIAGNOSIS — R739 Hyperglycemia, unspecified: Secondary | ICD-10-CM | POA: Diagnosis not present

## 2021-09-10 DIAGNOSIS — Z0000 Encounter for general adult medical examination without abnormal findings: Secondary | ICD-10-CM

## 2021-09-10 DIAGNOSIS — M85852 Other specified disorders of bone density and structure, left thigh: Secondary | ICD-10-CM

## 2021-09-10 DIAGNOSIS — Z6835 Body mass index (BMI) 35.0-35.9, adult: Secondary | ICD-10-CM | POA: Diagnosis not present

## 2021-09-10 DIAGNOSIS — K219 Gastro-esophageal reflux disease without esophagitis: Secondary | ICD-10-CM

## 2021-09-10 DIAGNOSIS — I10 Essential (primary) hypertension: Secondary | ICD-10-CM

## 2021-09-10 DIAGNOSIS — E785 Hyperlipidemia, unspecified: Secondary | ICD-10-CM | POA: Diagnosis not present

## 2021-09-10 DIAGNOSIS — M5020 Other cervical disc displacement, unspecified cervical region: Secondary | ICD-10-CM | POA: Diagnosis not present

## 2021-09-10 DIAGNOSIS — M85851 Other specified disorders of bone density and structure, right thigh: Secondary | ICD-10-CM

## 2021-09-10 LAB — COMPREHENSIVE METABOLIC PANEL
ALT: 11 U/L (ref 0–35)
AST: 14 U/L (ref 0–37)
Albumin: 4.1 g/dL (ref 3.5–5.2)
Alkaline Phosphatase: 94 U/L (ref 39–117)
BUN: 7 mg/dL (ref 6–23)
CO2: 29 mEq/L (ref 19–32)
Calcium: 9.8 mg/dL (ref 8.4–10.5)
Chloride: 100 mEq/L (ref 96–112)
Creatinine, Ser: 0.73 mg/dL (ref 0.40–1.20)
GFR: 84.27 mL/min (ref >60.00)
Glucose, Bld: 79 mg/dL (ref 70–99)
Potassium: 4.3 mEq/L (ref 3.5–5.1)
Sodium: 134 mEq/L — ABNORMAL LOW (ref 135–145)
Total Bilirubin: 0.3 mg/dL (ref 0.2–1.2)
Total Protein: 7.7 g/dL (ref 6.0–8.3)

## 2021-09-10 LAB — CBC WITH DIFFERENTIAL/PLATELET
Basophils Absolute: 0.1 10*3/uL (ref 0.0–0.1)
Basophils Relative: 1.3 % (ref 0.0–3.0)
Eosinophils Absolute: 0.1 10*3/uL (ref 0.0–0.7)
Eosinophils Relative: 1.5 % (ref 0.0–5.0)
HCT: 40.6 % (ref 36.0–46.0)
Hemoglobin: 13.7 g/dL (ref 12.0–15.0)
Lymphocytes Relative: 33.9 % (ref 12.0–46.0)
Lymphs Abs: 2.6 10*3/uL (ref 0.7–4.0)
MCHC: 33.7 g/dL (ref 30.0–36.0)
MCV: 89.8 fl (ref 78.0–100.0)
Monocytes Absolute: 0.6 10*3/uL (ref 0.1–1.0)
Monocytes Relative: 7.4 % (ref 3.0–12.0)
Neutro Abs: 4.2 10*3/uL (ref 1.4–7.7)
Neutrophils Relative %: 55.9 % (ref 43.0–77.0)
Platelets: 351 10*3/uL (ref 150.0–400.0)
RBC: 4.52 Mil/uL (ref 3.87–5.11)
RDW: 13.3 % (ref 11.5–15.5)
WBC: 7.5 10*3/uL (ref 4.0–10.5)

## 2021-09-10 LAB — LIPID PANEL
Cholesterol: 219 mg/dL — ABNORMAL HIGH (ref 0–200)
HDL: 38.8 mg/dL — ABNORMAL LOW (ref >39.00)
NonHDL: 179.78
Total CHOL/HDL Ratio: 6
Triglycerides: 243 mg/dL — ABNORMAL HIGH (ref 0.0–149.0)
VLDL: 48.6 mg/dL — ABNORMAL HIGH (ref 0.0–40.0)

## 2021-09-10 LAB — LDL CHOLESTEROL, DIRECT: Direct LDL: 152 mg/dL

## 2021-09-10 LAB — HEMOGLOBIN A1C: Hgb A1c MFr Bld: 5.5 % (ref 4.6–6.5)

## 2021-09-10 LAB — TSH: TSH: 1.19 u[IU]/mL (ref 0.35–5.50)

## 2021-09-10 MED ORDER — CYCLOBENZAPRINE HCL 10 MG PO TABS: ORAL_TABLET | ORAL | 5 refills | Status: DC

## 2021-09-10 MED ORDER — CALCIUM CARB-CHOLECALCIFEROL 600-20 MG-MCG PO TABS: 1.0000 | ORAL_TABLET | Freq: Two times a day (BID) | ORAL | Status: AC

## 2021-09-10 MED ORDER — VITAMIN C 1000 MG PO TABS: 1000.0000 mg | ORAL_TABLET | Freq: Every day | ORAL | Status: AC

## 2021-09-10 MED ORDER — PREGABALIN 100 MG PO CAPS
100.0000 mg | ORAL_CAPSULE | Freq: Every day | ORAL | 3 refills | Status: DC
Start: 2021-09-10 — End: 2022-07-06

## 2021-09-10 NOTE — Assessment & Plan Note (Signed)
Chronic Check a1c Low sugar / carb diet Stressed regular exercise  

## 2021-09-10 NOTE — Assessment & Plan Note (Signed)
Chronic BP well controlled Continue amlodipine 5 mg daily cmp  

## 2021-09-10 NOTE — Assessment & Plan Note (Signed)
Chronic Taking over-the-counter antacid as needed Trying to control it naturally

## 2021-09-10 NOTE — Assessment & Plan Note (Signed)
Chronic Check lipid panel, cmp  Continue lifestyle control Regular exercise and healthy diet encouraged

## 2021-09-10 NOTE — Assessment & Plan Note (Signed)
Chronic Left ear canal pain  Controlled Continue lyrica 100 mg nightly

## 2021-09-10 NOTE — Assessment & Plan Note (Signed)
Chronic Encouraged regular exercise-advised her to start using her extra besides right that she has at home Encouraged decreased portions and healthy diet

## 2021-09-10 NOTE — Assessment & Plan Note (Signed)
Chronic Chronic neck pain Lyrica 100 mg nightly is likely helping Tolerable, controlled Continue above

## 2021-09-10 NOTE — Assessment & Plan Note (Addendum)
Chronic dexa up to date Encouraged regular exercise Continue taking calcium and vitamin d

## 2021-09-14 DIAGNOSIS — L821 Other seborrheic keratosis: Secondary | ICD-10-CM | POA: Diagnosis not present

## 2021-09-14 DIAGNOSIS — D485 Neoplasm of uncertain behavior of skin: Secondary | ICD-10-CM | POA: Diagnosis not present

## 2021-09-14 DIAGNOSIS — D1801 Hemangioma of skin and subcutaneous tissue: Secondary | ICD-10-CM | POA: Diagnosis not present

## 2021-09-14 DIAGNOSIS — D225 Melanocytic nevi of trunk: Secondary | ICD-10-CM | POA: Diagnosis not present

## 2021-09-28 DIAGNOSIS — L98429 Non-pressure chronic ulcer of back with unspecified severity: Secondary | ICD-10-CM | POA: Diagnosis not present

## 2021-09-28 DIAGNOSIS — D485 Neoplasm of uncertain behavior of skin: Secondary | ICD-10-CM | POA: Diagnosis not present

## 2021-12-24 ENCOUNTER — Other Ambulatory Visit: Payer: Self-pay | Admitting: Internal Medicine

## 2021-12-27 ENCOUNTER — Encounter: Payer: Self-pay | Admitting: Internal Medicine

## 2022-01-11 ENCOUNTER — Telehealth: Payer: Self-pay | Admitting: Internal Medicine

## 2022-01-11 NOTE — Telephone Encounter (Signed)
Left message for patient to call back to schedule Medicare Annual Wellness Visit   Last AWV  04/27/20  Please schedule at anytime with [facility] if patient calls the office back.      Any questions, please call me at [phone]

## 2022-02-01 ENCOUNTER — Ambulatory Visit: Payer: PPO

## 2022-02-09 DIAGNOSIS — D225 Melanocytic nevi of trunk: Secondary | ICD-10-CM | POA: Diagnosis not present

## 2022-02-09 DIAGNOSIS — Z1283 Encounter for screening for malignant neoplasm of skin: Secondary | ICD-10-CM | POA: Diagnosis not present

## 2022-05-18 ENCOUNTER — Telehealth: Payer: Self-pay | Admitting: Internal Medicine

## 2022-05-18 NOTE — Telephone Encounter (Signed)
Contacted Colleen Walsh to schedule their annual wellness visit. Appointment made for 05/30/2022.  [facility] Care Guide [facility] AWV TEAM Direct Dial: [phone]

## 2022-05-30 ENCOUNTER — Ambulatory Visit (INDEPENDENT_AMBULATORY_CARE_PROVIDER_SITE_OTHER): Payer: PPO

## 2022-05-30 VITALS — Ht 60.0 in | Wt 181.0 lb

## 2022-05-30 DIAGNOSIS — Z0000 Encounter for general adult medical examination without abnormal findings: Secondary | ICD-10-CM | POA: Diagnosis not present

## 2022-05-30 DIAGNOSIS — Z780 Asymptomatic menopausal state: Secondary | ICD-10-CM
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
## 2022-05-30 NOTE — Patient Instructions (Signed)
Colleen Walsh , Thank you for taking time to come for your Medicare Wellness Visit. I appreciate your ongoing commitment to your health goals. Please review the following plan we discussed and let me know if I can assist you in the future.   These are the goals we discussed:  Goals       DIET - INCREASE WATER INTAKE      Patient Stated (pt-stated)      My goal is to be more physically active and lose weight.      Track and Manage My Blood Pressure-Hypertension      Timeframe:  Short-Term Goal Priority:  High Start Date:     04/28/20                        Expected End Date: 06/08/20                      Follow Up Date 05/15/20   -Buy new blood pressure monitor for upper arm use (Omron or similar) -Trial 2-3 weeks off amlodipine and monitor BP closely - check blood pressure daily - choose a place to take my blood pressure (home, clinic or office, retail store) - write blood pressure results in a log or diary    Why is this important?   You won't feel high blood pressure, but it can still hurt your blood vessels.  High blood pressure can cause heart or kidney problems. It can also cause a stroke.  Making lifestyle changes like losing a little weight or eating less salt will help.  Checking your blood pressure at home and at different times of the day can help to control blood pressure.  If the doctor prescribes medicine remember to take it the way the doctor ordered.  Call the office if you cannot afford the medicine or if there are questions about it.     Notes:         This is a list of the screening recommended for you and due dates:  Health Maintenance  Topic Date Due   COVID-19 Vaccine (4 - 2023-24 season) 09/10/2021   DEXA scan (bone density measurement)  04/22/2022   Flu Shot  08/11/2022   Mammogram  09/01/2022   Medicare Annual Wellness Visit  05/30/2023   DTaP/Tdap/Td vaccine (2 - Td or Tdap) 12/11/2023   Colon Cancer Screening  09/11/2024   Pneumonia Vaccine   Completed   Hepatitis C Screening: USPSTF Recommendation to screen - Ages 104-79 yo.  Completed   Zoster (Shingles) Vaccine  Completed   HPV Vaccine  Aged Out    Advanced directives: Please bring a copy of your health care power of attorney and living will to the office to be added to your chart at your convenience.   Conditions/risks identified: Aim for 30 minutes of exercise or brisk walking, 6-8 glasses of water, and 5 servings of fruits and vegetables each day.   Next appointment: Follow up in one year for your annual wellness visit    Preventive Care 65 Years and Older, Female Preventive care refers to lifestyle choices and visits with your health care provider that can promote health and wellness. What does preventive care include? A yearly physical exam. This is also called an annual well check. Dental exams once or twice a year. Routine eye exams. Ask your health care provider how often you should have your eyes checked. Personal lifestyle choices, including: Daily care of your  teeth and gums. Regular physical activity. Eating a healthy diet. Avoiding tobacco and drug use. Limiting alcohol use. Practicing safe sex. Taking low-dose aspirin every day. Taking vitamin and mineral supplements as recommended by your health care provider. What happens during an annual well check? The services and screenings done by your health care provider during your annual well check will depend on your age, overall health, lifestyle risk factors, and family history of disease. Counseling  Your health care provider may ask you questions about your: Alcohol use. Tobacco use. Drug use. Emotional well-being. Home and relationship well-being. Sexual activity. Eating habits. History of falls. Memory and ability to understand (cognition). Work and work Astronomer. Reproductive health. Screening  You may have the following tests or measurements: Height, weight, and BMI. Blood  pressure. Lipid and cholesterol levels. These may be checked every 5 years, or more frequently if you are over 66 years old. Skin check. Lung cancer screening. You may have this screening every year starting at age 25 if you have a 30-pack-year history of smoking and currently smoke or have quit within the past 15 years. Fecal occult blood test (FOBT) of the stool. You may have this test every year starting at age 63. Flexible sigmoidoscopy or colonoscopy. You may have a sigmoidoscopy every 5 years or a colonoscopy every 10 years starting at age 73. Hepatitis C blood test. Hepatitis B blood test. Sexually transmitted disease (STD) testing. Diabetes screening. This is done by checking your blood sugar (glucose) after you have not eaten for a while (fasting). You may have this done every 1-3 years. Bone density scan. This is done to screen for osteoporosis. You may have this done starting at age 90. Mammogram. This may be done every 1-2 years. Talk to your health care provider about how often you should have regular mammograms. Talk with your health care provider about your test results, treatment options, and if necessary, the need for more tests. Vaccines  Your health care provider may recommend certain vaccines, such as: Influenza vaccine. This is recommended every year. Tetanus, diphtheria, and acellular pertussis (Tdap, Td) vaccine. You may need a Td booster every 10 years. Zoster vaccine. You may need this after age 39. Pneumococcal 13-valent conjugate (PCV13) vaccine. One dose is recommended after age 20. Pneumococcal polysaccharide (PPSV23) vaccine. One dose is recommended after age 40. Talk to your health care provider about which screenings and vaccines you need and how often you need them. This information is not intended to replace advice given to you by your health care provider. Make sure you discuss any questions you have with your health care provider. Document Released:  01/23/2015 Document Revised: 09/16/2015 Document Reviewed: 10/28/2014 Elsevier Interactive Patient Education  2017 [employer].  Fall Prevention in the Home Falls can cause injuries. They can happen to people of all ages. There are many things you can do to make your home safe and to help prevent falls. What can I do on the outside of my home? Regularly fix the edges of walkways and driveways and fix any cracks. Remove anything that might make you trip as you walk through a door, such as a raised step or threshold. Trim any bushes or trees on the path to your home. Use bright outdoor lighting. Clear any walking paths of anything that might make someone trip, such as rocks or tools. Regularly check to see if handrails are loose or broken. Make sure that both sides of any steps have handrails. Any raised decks and  porches should have guardrails on the edges. Have any leaves, snow, or ice cleared regularly. Use sand or salt on walking paths during winter. Clean up any spills in your garage right away. This includes oil or grease spills. What can I do in the bathroom? Use night lights. Install grab bars by the toilet and in the tub and shower. Do not use towel bars as grab bars. Use non-skid mats or decals in the tub or shower. If you need to sit down in the shower, use a plastic, non-slip stool. Keep the floor dry. Clean up any water that spills on the floor as soon as it happens. Remove soap buildup in the tub or shower regularly. Attach bath mats securely with double-sided non-slip rug tape. Do not have throw rugs and other things on the floor that can make you trip. What can I do in the bedroom? Use night lights. Make sure that you have a light by your bed that is easy to reach. Do not use any sheets or blankets that are too big for your bed. They should not hang down onto the floor. Have a firm chair that has side arms. You can use this for support while you get dressed. Do not have  throw rugs and other things on the floor that can make you trip. What can I do in the kitchen? Clean up any spills right away. Avoid walking on wet floors. Keep items that you use a lot in easy-to-reach places. If you need to reach something above you, use a strong step stool that has a grab bar. Keep electrical cords out of the way. Do not use floor polish or wax that makes floors slippery. If you must use wax, use non-skid floor wax. Do not have throw rugs and other things on the floor that can make you trip. What can I do with my stairs? Do not leave any items on the stairs. Make sure that there are handrails on both sides of the stairs and use them. Fix handrails that are broken or loose. Make sure that handrails are as long as the stairways. Check any carpeting to make sure that it is firmly attached to the stairs. Fix any carpet that is loose or worn. Avoid having throw rugs at the top or bottom of the stairs. If you do have throw rugs, attach them to the floor with carpet tape. Make sure that you have a light switch at the top of the stairs and the bottom of the stairs. If you do not have them, ask someone to add them for you. What else can I do to help prevent falls? Wear shoes that: Do not have high heels. Have rubber bottoms. Are comfortable and fit you well. Are closed at the toe. Do not wear sandals. If you use a stepladder: Make sure that it is fully opened. Do not climb a closed stepladder. Make sure that both sides of the stepladder are locked into place. Ask someone to hold it for you, if possible. Clearly mark and make sure that you can see: Any grab bars or handrails. First and last steps. Where the edge of each step is. Use tools that help you move around (mobility aids) if they are needed. These include: Canes. Walkers. Scooters. Crutches. Turn on the lights when you go into a dark area. Replace any light bulbs as soon as they burn out. Set up your furniture so  you have a clear path. Avoid moving your furniture around. If  any of your floors are uneven, fix them. If there are any pets around you, be aware of where they are. Review your medicines with your doctor. Some medicines can make you feel dizzy. This can increase your chance of falling. Ask your doctor what other things that you can do to help prevent falls. This information is not intended to replace advice given to you by your health care provider. Make sure you discuss any questions you have with your health care provider. Document Released: 10/23/2008 Document Revised: 06/04/2015 Document Reviewed: 01/31/2014 Elsevier Interactive Patient Education  2017 [employer].

## 2022-06-27 DIAGNOSIS — H25813 Combined forms of age-related cataract, bilateral: Secondary | ICD-10-CM | POA: Diagnosis not present

## 2022-06-27 DIAGNOSIS — H40013 Open angle with borderline findings, low risk, bilateral: Secondary | ICD-10-CM | POA: Diagnosis not present

## 2022-06-27 DIAGNOSIS — H35371 Puckering of macula, right eye: Secondary | ICD-10-CM | POA: Diagnosis not present

## 2022-06-27 DIAGNOSIS — H524 Presbyopia: Secondary | ICD-10-CM | POA: Diagnosis not present

## 2022-06-27 DIAGNOSIS — Q141 Congenital malformation of retina: Secondary | ICD-10-CM | POA: Diagnosis not present

## 2022-07-05 ENCOUNTER — Other Ambulatory Visit: Payer: Self-pay | Admitting: Internal Medicine

## 2022-07-28 ENCOUNTER — Ambulatory Visit: Payer: PPO | Admitting: Gastroenterology

## 2022-07-28 ENCOUNTER — Encounter: Payer: Self-pay | Admitting: Gastroenterology

## 2022-07-28 VITALS — BP 104/60 | HR 86 | Ht 60.0 in | Wt 177.0 lb

## 2022-07-28 DIAGNOSIS — Q141 Congenital malformation of retina: Secondary | ICD-10-CM

## 2022-07-28 DIAGNOSIS — Z800 Family history of malignant neoplasm of digestive organs: Secondary | ICD-10-CM | POA: Insufficient documentation

## 2022-07-28 DIAGNOSIS — K219 Gastro-esophageal reflux disease without esophagitis: Secondary | ICD-10-CM

## 2022-07-28 NOTE — Patient Instructions (Signed)
Please follow as needed  _______________________________________________________  If your blood pressure at your visit was 140/90 or greater, please contact your primary care physician to follow up on this.  _______________________________________________________  If you are age 70 or older, your body mass index should be between 23-30. Your Body mass index is 34.57 kg/m. If this is out of the aforementioned range listed, please consider follow up with your Primary Care Provider.  If you are age 27 or younger, your body mass index should be between 19-25. Your Body mass index is 34.57 kg/m. If this is out of the aformentioned range listed, please consider follow up with your Primary Care Provider.   ________________________________________________________  The [facility] providers would like to encourage you to use [facility] to communicate with providers for non-urgent requests or questions.  Due to long hold times on the telephone, sending your provider a message by [facility] may be a faster and more efficient way to get a response.  Please allow 48 business hours for a response.  Please remember that this is for non-urgent requests.  _______________________________________________________

## 2022-07-28 NOTE — Progress Notes (Signed)
HPI : Colleen Walsh is a 72 y.o. female with a history of GERD, arthritis and hypertension who is referred to [place] by Pincus Sanes, MD at request of her ophthalmologist for consideration of repeat colonoscopy. The patient is followed by Dr. Myrtie Neither, but appears to have been booked in my clinic by mistake today.  The patient apparently underwent a dilated eye exam last year and was found to have evidence of CHRPE.  She was told that this means she likely has a polyposis syndrome and she needs to repeat a colonoscopy.  The patient has a strong family history of colon cancer with her father diagnosed in his 3s, and 2 brothers diagnosed in their 40s and 31s.  The patient has been getting regular colonoscopies every 5 years, and has not had any polyps that she is aware of. Her last colonoscopy was in 2021 and was notable for left-sided diverticulosis, but no polyps.  She was recommended to repeat in 5 years based on family history.  I can see her previous colonoscopies in 2010 and 2015 which were also negative for precancerous polyps.  He has chronic GERD symptoms and has had issues with dysphagia, but she states that the symptoms are currently well-controlled.  She has intolerances to PPIs and H2 blockers.  Currently she takes Pepto-Bismol as needed for reflux symptoms.  She is satisfied with her current symptom control. Her last upper endoscopy was last January and a low-grade stenosis was dilated with the balloon.  No evidence of esophagitis or Barrett's was noted.     EGD Feb 04, 2021 (Dr. Myrtie Neither) Indications: Dysphagia, GERD Findings: 1-2 cm hiatal hernia Mild stenosis of the GEJ, dilated with balloon (15 mm?) Otherwise normal esophagus (no esophagitis or Barrett's) Normal stomach (Hill grade 3 valve) Normal duodenum   Colonoscopy September 12, 2019 (Dr. Myrtie Neither) Indication: Screening, increased risk (multiple first-degree relatives with colon cancer) Findings: Left-sided  diverticulosis, otherwise normal Recommended to repeat in 5 years   EGD January 16, 2017 (Dr. Myrtie Neither) Indication: Dysphagia, abnormal esophagram   Esophageal manometry September 2017 Normal LES relaxation and no peristaltic abnormalities noted  pH/impedance test September 2017 Esophageal acid exposure off PPI, DeMeester score 39.6 No symptom correlation based on SAP  Colonoscopy 2015 (Dr. Arlyce Dice) Diverticulosis, otherwise normal  Colonoscopy 2010 (Dr. Arlyce Dice) Diverticulosis, otherwise normal   Past Medical History:  Diagnosis Date   Arthritis    Bulging of cervical intervertebral disc    2 discs   CONTACT DERMATITIS&OTHER ECZEMA DUE UNSPEC CAUSE    DEGENERATIVE JOINT DISEASE, CERVICAL SPINE    DYSLIPIDEMIA    GERD    GLUCOSE INTOLERANCE, HX OF    HYPERTENSION    Pinched nerve in neck    Tear of LCL (lateral collateral ligament) of knee 08/2014   R knee pain, dx on [place], s/p IAC injection   VENEREAL WART      Past Surgical History:  Procedure Laterality Date   89 HOUR PH STUDY N/A 09/28/2015   Procedure: 24 HOUR PH STUDY;  Surgeon: Sherrilyn Rist, MD;  Location: [facility];  Service: Gastroenterology;  Laterality: N/A;   CHOLECYSTECTOMY  1992   ESOPHAGEAL MANOMETRY N/A 09/28/2015   Procedure: ESOPHAGEAL MANOMETRY (EM);  Surgeon: Sherrilyn Rist, MD;  Location: [facility];  Service: Gastroenterology;  Laterality: N/A;  IMPEADANCE    FOOT FRACTURE SURGERY Left 2006   MOHS SURGERY  2001   forehead-basal cell   swallowing test     Family  History  Problem Relation Age of Onset   Colon cancer Father        64's   Diabetes Father    Stroke Father    Colon cancer Brother    Depression Mother    Alcohol abuse Mother    Heart attack Mother 71       SMOKER   Alcohol abuse Sister    Depression Sister    Alcohol abuse Brother    Depression Brother    Colon cancer Brother    Colon polyps Brother    Arthritis Other        grandparent   Hypertension Other         parent, other relative   Stroke Other        parent, other relative   Heart attack Cousin    Esophageal cancer Neg Hx    Stomach cancer Neg Hx    Rectal cancer Neg Hx    Social History   Tobacco Use   Smoking status: Never   Smokeless tobacco: Never  Vaping Use   Vaping status: Never Used  Substance Use Topics   Alcohol use: No    Alcohol/week: 0.0 standard drinks of alcohol   Drug use: No   Current Outpatient Medications  Medication Sig Dispense Refill   amLODipine (NORVASC) 5 MG tablet Take 1 tablet by mouth once daily 90 tablet 2   Ascorbic Acid (VITAMIN C) 1000 MG tablet Take 1 tablet (1,000 mg total) by mouth daily.     Calcium Carb-Cholecalciferol (CALCIUM+D3) 600-20 MG-MCG TABS Take 1 tablet by mouth in the morning and at bedtime.     cyclobenzaprine (FLEXERIL) 10 MG tablet TAKE 1/2 TO 1 (ONE-HALF TO ONE) TABLET BY MOUTH THREE TIMES DAILY AS NEEDED FOR MUSCLE SPASM 60 tablet 5   pregabalin (LYRICA) 100 MG capsule Take 1 capsule by mouth at bedtime 90 capsule 0   No current facility-administered medications for this visit.   Allergies  Allergen Reactions   Augmentin [Amoxicillin-Pot Clavulanate]     vomiting   Mobic [Meloxicam] Other (See Comments)    Flares up her GERD   Valtrex [Valacyclovir Hcl]     Vomiting    Dexlansoprazole     Raises BP   Doxycycline     REACTION: rash on hand   Ergostat [Ergotamine Tartrate]    Erythromycin     Stomach irritation    Hydrocodone-Acetaminophen Nausea And Vomiting   Influenza Vaccines Other (See Comments)    Patient recalls that the reaction was myalgias and fever. This occurred in 2009 and pt has not taken a flu vaccine since.    Lisinopril     Subtherapeutic   Omeprazole     REACTION: nausea   Pantoprazole Sodium     Subtherapeutic   Phentermine     Raises BP   Prilosec [Omeprazole Magnesium]    Propranolol     Betachron   Ranitidine     Subtherapeutic   Statins Other (See Comments)    Causes myalgias    Verapamil     REACTION: nausea     Review of Systems: All systems reviewed and negative except where noted in HPI.    No results found.  Physical Exam: BP 104/60   Pulse 86   Ht 5' (1.524 m)   Wt 177 lb (80.3 kg)   BMI 34.57 kg/m  Constitutional: Pleasant,well-developed, Caucasian female in no acute distress. HEENT: Normocephalic and atraumatic. Conjunctivae are normal. No scleral icterus. Neurological: Alert and  oriented to person place and time. Skin: Skin is warm and dry. No rashes noted. Psychiatric: Normal mood and affect. Behavior is normal.  CBC    Component Value Date/Time   WBC 7.5 09/10/2021 1425   RBC 4.52 09/10/2021 1425   HGB 13.7 09/10/2021 1425   HCT 40.6 09/10/2021 1425   PLT 351.0 09/10/2021 1425   MCV 89.8 09/10/2021 1425   MCH 29.5 02/26/2018 0815   MCHC 33.7 09/10/2021 1425   RDW 13.3 09/10/2021 1425   LYMPHSABS 2.6 09/10/2021 1425   MONOABS 0.6 09/10/2021 1425   EOSABS 0.1 09/10/2021 1425   BASOSABS 0.1 09/10/2021 1425    CMP     Component Value Date/Time   NA 134 (L) 09/10/2021 1425   K 4.3 09/10/2021 1425   CL 100 09/10/2021 1425   CO2 29 09/10/2021 1425   GLUCOSE 79 09/10/2021 1425   BUN 7 09/10/2021 1425   CREATININE 0.73 09/10/2021 1425   CALCIUM 9.8 09/10/2021 1425   PROT 7.7 09/10/2021 1425   ALBUMIN 4.1 09/10/2021 1425   AST 14 09/10/2021 1425   ALT 11 09/10/2021 1425   ALKPHOS 94 09/10/2021 1425   BILITOT 0.3 09/10/2021 1425   GFRNONAA >60 02/26/2018 0815   GFRAA >60 02/26/2018 0815       Latest Ref Rng & Units 09/10/2021    2:25 PM 12/21/2020   10:41 AM 07/20/2020    1:36 PM  CBC EXTENDED  WBC 4.0 - 10.5 K/uL 7.5  5.7  6.7   RBC 3.87 - 5.11 Mil/uL 4.52  4.59  4.61   Hemoglobin 12.0 - 15.0 g/dL 65.7  84.6  96.2   HCT 36.0 - 46.0 % 40.6  41.6  41.2   Platelets 150.0 - 400.0 K/uL 351.0  357.0  345.0   NEUT# 1.4 - 7.7 K/uL 4.2  2.7  3.8   Lymph# 0.7 - 4.0 K/uL 2.6  2.3  2.2       ASSESSMENT AND PLAN: 72 year old  female with strong family history of colon cancer, recently found to have retinal findings consistent with CHRPE (I am unable to view the documentation from her ophthalmologist) and thus referred for possible repeat colonoscopy to assess for polyposis syndrome. Although this retinal finding is associated with Julian Reil syndrome/FAP, my understanding it is not pathognomonic. The patient has been undergoing regular endoscopic evaluations and has not been found to have any polyps. It would be extremely unlikely if not impossible that a patient in her 26s would not have any polyps if she had FAP. I do not think that we need to repeat a colonoscopy at this time.  Based on the patient's family history of a first-degree relative with colon cancer younger than the age of 63, I do agree with continue with current guidelines of a colonoscopy every 5 years. Regarding her GERD, I did suggest that if her GERD symptoms were particularly bothersome, she could be considered for Voquenza, given her intolerance to PPIs and H2 blockers.  Given that her symptoms are currently minimal, I recommended she just continue with as needed Pepto-Bismol.  CHRPE - No need for repeat colonoscopy given no polyps on colonoscopy in 2021.  - Extremely unlikely to have Gardner's syndrome/FAP given absence of polyps on 3 prior colonoscopies over 11 years. -Will message Dr. Myrtie Neither to ensure he agrees with this plan.  Family history of colon cancer - Repeat colonoscopy 2026 as originally planned  GERD - Continue as needed Pepto - Consider Voquenza if symptoms become  more frequent/bothersome  Colleen Silberstein E. Tomasa Rand, MD [facility]   CC:  Pincus Sanes, MD

## 2022-08-04 ENCOUNTER — Encounter: Payer: Self-pay | Admitting: Internal Medicine

## 2022-08-04 ENCOUNTER — Ambulatory Visit
Admission: RE | Admit: 2022-08-04 | Discharge: 2022-08-04 | Disposition: A | Discharge status: Home / Self Care | Payer: PPO | Source: Ambulatory Visit | Attending: Internal Medicine | Admitting: Internal Medicine

## 2022-08-04 DIAGNOSIS — M8588 Other specified disorders of bone density and structure, other site: Secondary | ICD-10-CM | POA: Diagnosis not present

## 2022-08-04 DIAGNOSIS — Z780 Asymptomatic menopausal state: Secondary | ICD-10-CM

## 2022-08-04 DIAGNOSIS — E349 Endocrine disorder, unspecified: Secondary | ICD-10-CM | POA: Diagnosis not present

## 2022-08-04 DIAGNOSIS — N958 Other specified menopausal and perimenopausal disorders: Secondary | ICD-10-CM | POA: Diagnosis not present

## 2022-08-08 ENCOUNTER — Encounter: Payer: PPO | Admitting: Internal Medicine

## 2022-08-08 DIAGNOSIS — Z0000 Encounter for general adult medical examination without abnormal findings: Secondary | ICD-10-CM

## 2022-08-08 DIAGNOSIS — E785 Hyperlipidemia, unspecified: Secondary | ICD-10-CM

## 2022-08-08 DIAGNOSIS — R739 Hyperglycemia, unspecified: Secondary | ICD-10-CM

## 2022-08-08 DIAGNOSIS — I10 Essential (primary) hypertension: Secondary | ICD-10-CM

## 2022-08-10 DIAGNOSIS — D225 Melanocytic nevi of trunk: Secondary | ICD-10-CM | POA: Diagnosis not present

## 2022-08-10 DIAGNOSIS — Z1283 Encounter for screening for malignant neoplasm of skin: Secondary | ICD-10-CM | POA: Diagnosis not present

## 2022-08-12 DIAGNOSIS — R2231 Localized swelling, mass and lump, right upper limb: Secondary | ICD-10-CM | POA: Diagnosis not present

## 2022-08-19 DIAGNOSIS — M75121 Complete rotator cuff tear or rupture of right shoulder, not specified as traumatic: Secondary | ICD-10-CM | POA: Diagnosis not present

## 2022-08-19 DIAGNOSIS — M75101 Unspecified rotator cuff tear or rupture of right shoulder, not specified as traumatic: Secondary | ICD-10-CM | POA: Diagnosis not present

## 2022-08-19 DIAGNOSIS — S46111A Strain of muscle, fascia and tendon of long head of biceps, right arm, initial encounter: Secondary | ICD-10-CM | POA: Diagnosis not present

## 2022-08-19 DIAGNOSIS — S46211A Strain of muscle, fascia and tendon of other parts of biceps, right arm, initial encounter: Secondary | ICD-10-CM | POA: Diagnosis not present

## 2022-08-19 DIAGNOSIS — M25621 Stiffness of right elbow, not elsewhere classified: Secondary | ICD-10-CM | POA: Diagnosis not present

## 2022-08-25 ENCOUNTER — Encounter (INDEPENDENT_AMBULATORY_CARE_PROVIDER_SITE_OTHER): Payer: Self-pay

## 2022-08-29 DIAGNOSIS — S46011A Strain of muscle(s) and tendon(s) of the rotator cuff of right shoulder, initial encounter: Secondary | ICD-10-CM | POA: Diagnosis not present

## 2022-09-02 DIAGNOSIS — Z1231 Encounter for screening mammogram for malignant neoplasm of breast: Secondary | ICD-10-CM | POA: Diagnosis not present

## 2022-09-02 LAB — HM MAMMOGRAPHY

## 2022-09-12 ENCOUNTER — Encounter: Payer: Self-pay | Admitting: Internal Medicine

## 2022-09-12 NOTE — Patient Instructions (Addendum)
Flu immunization administered today.      Blood work was ordered.   The lab is on the first floor.    Medications changes include :   fosamax 70 mg once a week.    Return in about 1 year (around 09/13/2023) for Physical Exam.    Health Maintenance, Female Adopting a healthy lifestyle and getting preventive care are important in promoting health and wellness. Ask your health care provider about: The right schedule for you to have regular tests and exams. Things you can do on your own to prevent diseases and keep yourself healthy. What should I know about diet, weight, and exercise? Eat a healthy diet  Eat a diet that includes plenty of vegetables, fruits, low-fat dairy products, and lean protein. Do not eat a lot of foods that are high in solid fats, added sugars, or sodium. Maintain a healthy weight Body mass index (BMI) is used to identify weight problems. It estimates body fat based on height and weight. Your health care provider can help determine your BMI and help you achieve or maintain a healthy weight. Get regular exercise Get regular exercise. This is one of the most important things you can do for your health. Most adults should: Exercise for at least 150 minutes each week. The exercise should increase your heart rate and make you sweat (moderate-intensity exercise). Do strengthening exercises at least twice a week. This is in addition to the moderate-intensity exercise. Spend less time sitting. Even light physical activity can be beneficial. Watch cholesterol and blood lipids Have your blood tested for lipids and cholesterol at 70 years of age, then have this test every 5 years. Have your cholesterol levels checked more often if: Your lipid or cholesterol levels are high. You are older than 70 years of age. You are at high risk for heart disease. What should I know about cancer screening? Depending on your health history and family history, you may need to have  cancer screening at various ages. This may include screening for: Breast cancer. Cervical cancer. Colorectal cancer. Skin cancer. Lung cancer. What should I know about heart disease, diabetes, and high blood pressure? Blood pressure and heart disease High blood pressure causes heart disease and increases the risk of stroke. This is more likely to develop in people who have high blood pressure readings or are overweight. Have your blood pressure checked: Every 3-5 years if you are 68-25 years of age. Every year if you are 32 years old or older. Diabetes Have regular diabetes screenings. This checks your fasting blood sugar level. Have the screening done: Once every three years after age 55 if you are at a normal weight and have a low risk for diabetes. More often and at a younger age if you are overweight or have a high risk for diabetes. What should I know about preventing infection? Hepatitis B If you have a higher risk for hepatitis B, you should be screened for this virus. Talk with your health care provider to find out if you are at risk for hepatitis B infection. Hepatitis C Testing is recommended for: Everyone born from [birth year] through 1963. Anyone with known risk factors for hepatitis C. Sexually transmitted infections (STIs) Get screened for STIs, including gonorrhea and chlamydia, if: You are sexually active and are younger than 70 years of age. You are older than 70 years of age and your health care provider tells you that you are at risk for this type of infection. Your sexual  activity has changed since you were last screened, and you are at increased risk for chlamydia or gonorrhea. Ask your health care provider if you are at risk. Ask your health care provider about whether you are at high risk for HIV. Your health care provider may recommend a prescription medicine to help prevent HIV infection. If you choose to take medicine to prevent HIV, you should first get tested for HIV.  You should then be tested every 3 months for as long as you are taking the medicine. Pregnancy If you are about to stop having your period (premenopausal) and you may become pregnant, seek counseling before you get pregnant. Take 400 to 800 micrograms (mcg) of folic acid every day if you become pregnant. Ask for birth control (contraception) if you want to prevent pregnancy. Osteoporosis and menopause Osteoporosis is a disease in which the bones lose minerals and strength with aging. This can result in bone fractures. If you are 63 years old or older, or if you are at risk for osteoporosis and fractures, ask your health care provider if you should: Be screened for bone loss. Take a calcium or vitamin D supplement to lower your risk of fractures. Be given hormone replacement therapy (HRT) to treat symptoms of menopause. Follow these instructions at home: Alcohol use Do not drink alcohol if: Your health care provider tells you not to drink. You are pregnant, may be pregnant, or are planning to become pregnant. If you drink alcohol: Limit how much you have to: 0-1 drink a day. Know how much alcohol is in your drink. In the [place] one drink equals one 12 oz bottle of beer (355 mL), one 5 oz glass of wine (148 mL), or one 1 oz glass of hard liquor (44 mL). Lifestyle Do not use any products that contain nicotine or tobacco. These products include cigarettes, chewing tobacco, and vaping devices, such as e-cigarettes. If you need help quitting, ask your health care provider. Do not use street drugs. Do not share needles. Ask your health care provider for help if you need support or information about quitting drugs. General instructions Schedule regular health, dental, and eye exams. Stay current with your vaccines. Tell your health care provider if: You often feel depressed. You have ever been abused or do not feel safe at home. Summary Adopting a healthy lifestyle and getting preventive care  are important in promoting health and wellness. Follow your health care provider's instructions about healthy diet, exercising, and getting tested or screened for diseases. Follow your health care provider's instructions on monitoring your cholesterol and blood pressure. This information is not intended to replace advice given to you by your health care provider. Make sure you discuss any questions you have with your health care provider. Document Revised: 05/18/2020 Document Reviewed: 05/18/2020 Elsevier Patient Education  2024 [employer].

## 2022-09-12 NOTE — Progress Notes (Unsigned)
Subjective:    Patient ID: Colleen Walsh, female    DOB: Jan 23, 1950, 72 y.o.   MRN: [id number]      HPI Colleen Walsh is here for a Physical exam and her chronic medical problems.    She has a right biceps tear and rotator cuff issue - she is following with Dr Dion Saucier.  She does have a lot of pain.    Medications and allergies reviewed with patient and updated if appropriate.  Current Outpatient Medications on File Prior to Visit  Medication Sig Dispense Refill   amLODipine (NORVASC) 5 MG tablet Take 1 tablet by mouth once daily 90 tablet 2   Ascorbic Acid (VITAMIN C) 1000 MG tablet Take 1 tablet (1,000 mg total) by mouth daily.     Calcium Carb-Cholecalciferol (CALCIUM+D3) 600-20 MG-MCG TABS Take 1 tablet by mouth in the morning and at bedtime.     cyclobenzaprine (FLEXERIL) 10 MG tablet TAKE 1/2 TO 1 (ONE-HALF TO ONE) TABLET BY MOUTH THREE TIMES DAILY AS NEEDED FOR MUSCLE SPASM 60 tablet 5   No current facility-administered medications on file prior to visit.    Review of Systems  Constitutional:  Negative for fever.  Eyes:  Negative for visual disturbance.  Respiratory:  Negative for cough, shortness of breath and wheezing.   Cardiovascular:  Negative for chest pain, palpitations and leg swelling.  Gastrointestinal:  Positive for constipation (chronic - controlled with magnesium). Negative for abdominal pain, blood in stool and diarrhea.       No gerd  Genitourinary:  Negative for dysuria.  Musculoskeletal:  Positive for arthralgias and back pain (intermittent  - controlled).  Skin:  Negative for rash.  Neurological:  Negative for light-headedness and headaches.  Psychiatric/Behavioral:  Negative for dysphoric mood. The patient is not nervous/anxious.        Objective:   Vitals:   09/13/22 1031  BP: 108/72  Pulse: 82  Temp: 98.3 F (36.8 C)  SpO2: 97%   Filed Weights   09/13/22 1031  Weight: 172 lb (78 kg)   Body mass index is 33.59 kg/m.  BP  Readings from Last 3 Encounters:  09/13/22 108/72  07/28/22 104/60  09/10/21 118/80    Wt Readings from Last 3 Encounters:  09/13/22 172 lb (78 kg)  07/28/22 177 lb (80.3 kg)  05/30/22 181 lb (82.1 kg)       Physical Exam Constitutional: She appears well-developed and well-nourished. No distress.  HENT:  Head: Normocephalic and atraumatic.  Right Ear: External ear normal. Normal ear canal and TM Left Ear: External ear normal.  Normal ear canal and TM Mouth/Throat: Oropharynx is clear and moist.  Eyes: Conjunctivae normal.  Neck: Neck supple. No tracheal deviation present. No thyromegaly present.  No carotid bruit  Cardiovascular: Normal rate, regular rhythm and normal heart sounds.   No murmur heard.  No edema. Pulmonary/Chest: Effort normal and breath sounds normal. No respiratory distress. She has no wheezes. She has no rales.  Breast: deferred   Abdominal: Soft. She exhibits no distension. There is no tenderness.  Lymphadenopathy: She has no cervical adenopathy.  Skin: Skin is warm and dry. She is not diaphoretic.  Psychiatric: She has a normal mood and affect. Her behavior is normal.     Lab Results  Component Value Date   WBC 7.5 09/10/2021   HGB 13.7 09/10/2021   HCT 40.6 09/10/2021   PLT 351.0 09/10/2021   GLUCOSE 79 09/10/2021   CHOL 219 (H) 09/10/2021   TRIG  243.0 (H) 09/10/2021   HDL 38.80 (L) 09/10/2021   LDLDIRECT 152.0 09/10/2021   LDLCALC 166 (H) 07/20/2020   ALT 11 09/10/2021   AST 14 09/10/2021   NA 134 (L) 09/10/2021   K 4.3 09/10/2021   CL 100 09/10/2021   CREATININE 0.73 09/10/2021   BUN 7 09/10/2021   CO2 29 09/10/2021   TSH 1.19 09/10/2021   HGBA1C 5.5 09/10/2021         Assessment & Plan:   Physical exam: Screening blood work  ordered Exercise  some - goes to [employer] Weight  - obese - encouraged weight loss - has lost some weight-continue efforts Substance abuse  none   Reviewed recommended immunizations.  Flu vaccine  given   Health Maintenance  Topic Date Due   INFLUENZA VACCINE  08/11/2022   COVID-19 Vaccine (4 - 2023-24 season) 09/11/2022   Medicare Annual Wellness (AWV)  05/30/2023   MAMMOGRAM  09/02/2023   DTaP/Tdap/Td (2 - Td or Tdap) 12/11/2023   DEXA SCAN  08/03/2024   Colonoscopy  09/11/2024   Pneumonia Vaccine 31+ Years old  Completed   Hepatitis C Screening  Completed   Zoster Vaccines- Shingrix  Completed   HPV VACCINES  Aged Out          See Problem List for Assessment and Plan of chronic medical problems.

## 2022-09-13 ENCOUNTER — Ambulatory Visit (INDEPENDENT_AMBULATORY_CARE_PROVIDER_SITE_OTHER): Payer: PPO | Admitting: Internal Medicine

## 2022-09-13 VITALS — BP 108/72 | HR 82 | Temp 98.3°F | Ht 60.0 in | Wt 172.0 lb

## 2022-09-13 DIAGNOSIS — K219 Gastro-esophageal reflux disease without esophagitis: Secondary | ICD-10-CM | POA: Diagnosis not present

## 2022-09-13 DIAGNOSIS — R739 Hyperglycemia, unspecified: Secondary | ICD-10-CM

## 2022-09-13 DIAGNOSIS — I10 Essential (primary) hypertension: Secondary | ICD-10-CM | POA: Diagnosis not present

## 2022-09-13 DIAGNOSIS — B0229 Other postherpetic nervous system involvement: Secondary | ICD-10-CM | POA: Diagnosis not present

## 2022-09-13 DIAGNOSIS — E785 Hyperlipidemia, unspecified: Secondary | ICD-10-CM

## 2022-09-13 DIAGNOSIS — M5489 Other dorsalgia: Secondary | ICD-10-CM

## 2022-09-13 DIAGNOSIS — Z0000 Encounter for general adult medical examination without abnormal findings: Secondary | ICD-10-CM | POA: Diagnosis not present

## 2022-09-13 DIAGNOSIS — Z23 Encounter for immunization: Secondary | ICD-10-CM

## 2022-09-13 DIAGNOSIS — E6609 Other obesity due to excess calories: Secondary | ICD-10-CM | POA: Diagnosis not present

## 2022-09-13 DIAGNOSIS — M810 Age-related osteoporosis without current pathological fracture: Secondary | ICD-10-CM | POA: Diagnosis not present

## 2022-09-13 DIAGNOSIS — Z6833 Body mass index (BMI) 33.0-33.9, adult: Secondary | ICD-10-CM

## 2022-09-13 LAB — COMPREHENSIVE METABOLIC PANEL
ALT: 10 U/L (ref 0–35)
AST: 12 U/L (ref 0–37)
Albumin: 4 g/dL (ref 3.5–5.2)
Alkaline Phosphatase: 73 U/L (ref 39–117)
BUN: 10 mg/dL (ref 6–23)
CO2: 25 meq/L (ref 19–32)
Calcium: 10.1 mg/dL (ref 8.4–10.5)
Chloride: 102 meq/L (ref 96–112)
Creatinine, Ser: 0.69 mg/dL (ref 0.40–1.20)
GFR: 88.3 mL/min (ref >60.00)
Glucose, Bld: 83 mg/dL (ref 70–99)
Potassium: 3.9 meq/L (ref 3.5–5.1)
Sodium: 135 meq/L (ref 135–145)
Total Bilirubin: 0.4 mg/dL (ref 0.2–1.2)
Total Protein: 7.4 g/dL (ref 6.0–8.3)

## 2022-09-13 LAB — CBC WITH DIFFERENTIAL/PLATELET
Basophils Absolute: 0.1 10*3/uL (ref 0.0–0.1)
Basophils Relative: 1.8 % (ref 0.0–3.0)
Eosinophils Absolute: 0.1 10*3/uL (ref 0.0–0.7)
Eosinophils Relative: 1.8 % (ref 0.0–5.0)
HCT: 40.5 % (ref 36.0–46.0)
Hemoglobin: 13.5 g/dL (ref 12.0–15.0)
Lymphocytes Relative: 35.3 % (ref 12.0–46.0)
Lymphs Abs: 2.1 10*3/uL (ref 0.7–4.0)
MCHC: 33.4 g/dL (ref 30.0–36.0)
MCV: 90.2 fl (ref 78.0–100.0)
Monocytes Absolute: 0.4 10*3/uL (ref 0.1–1.0)
Monocytes Relative: 7.1 % (ref 3.0–12.0)
Neutro Abs: 3.2 10*3/uL (ref 1.4–7.7)
Neutrophils Relative %: 54 % (ref 43.0–77.0)
Platelets: 384 10*3/uL (ref 150.0–400.0)
RBC: 4.49 Mil/uL (ref 3.87–5.11)
RDW: 13.7 % (ref 11.5–15.5)
WBC: 6 10*3/uL (ref 4.0–10.5)

## 2022-09-13 LAB — LIPID PANEL
Cholesterol: 217 mg/dL — ABNORMAL HIGH (ref 0–200)
HDL: 39.7 mg/dL (ref >39.00)
LDL Cholesterol: 142 mg/dL — ABNORMAL HIGH (ref 0–99)
NonHDL: 177.12
Total CHOL/HDL Ratio: 5
Triglycerides: 175 mg/dL — ABNORMAL HIGH (ref 0.0–149.0)
VLDL: 35 mg/dL (ref 0.0–40.0)

## 2022-09-13 LAB — HEMOGLOBIN A1C: Hgb A1c MFr Bld: 5.3 % (ref 4.6–6.5)

## 2022-09-13 MED ORDER — ALENDRONATE SODIUM 70 MG PO TABS: 70.0000 mg | ORAL_TABLET | ORAL | 3 refills | Status: DC

## 2022-09-13 MED ORDER — PREGABALIN 100 MG PO CAPS: 100.0000 mg | ORAL_CAPSULE | Freq: Every day | ORAL | 0 refills | Status: DC

## 2022-09-13 NOTE — Assessment & Plan Note (Signed)
Chronic, intermittent Flexeril 5-10 mg up to 3 times daily as needed Advised heat, ice and topical medication such as Biofreeze Advised stretching gently, avoiding bending, lifting and twisting movements

## 2022-09-13 NOTE — Assessment & Plan Note (Signed)
Chronic Left ear canal pain  Controlled Continue lyrica 100 mg nightly

## 2022-09-13 NOTE — Assessment & Plan Note (Signed)
Chronic BP well controlled Continue amlodipine 5 mg daily cmp, CBC

## 2022-09-13 NOTE — Assessment & Plan Note (Addendum)
Chronic dexa up to date-just done end of July Spine T-score -2.5, RFN T-score -2.5 Stressed regular exercise Continue taking calcium and vitamin d Check vitamin D level Discussed Fosamax, Prolia - will try fosamax 70 mg weekly-prescription sent to pharmacy Discussed possible side effects

## 2022-09-13 NOTE — Assessment & Plan Note (Signed)
Chronic Check a1c Low sugar / carb diet Stressed regular exercise  

## 2022-09-13 NOTE — Assessment & Plan Note (Signed)
Chronic Encouraged regular exercise Encouraged decreased portions and healthy diet

## 2022-09-13 NOTE — Assessment & Plan Note (Signed)
Chronic Taking over-the-counter antacid as needed Trying to control it naturally

## 2022-09-13 NOTE — Assessment & Plan Note (Signed)
Chronic Check lipid panel, cmp, TSH Continue lifestyle control Regular exercise and healthy diet encouraged

## 2022-09-14 LAB — TSH: TSH: 1.36 u[IU]/mL (ref 0.35–5.50)

## 2022-09-14 LAB — VITAMIN D 25 HYDROXY (VIT D DEFICIENCY, FRACTURES): VITD: 34.07 ng/mL (ref 30.00–100.00)

## 2022-11-11 DIAGNOSIS — S46011D Strain of muscle(s) and tendon(s) of the rotator cuff of right shoulder, subsequent encounter: Secondary | ICD-10-CM | POA: Diagnosis not present

## 2022-11-15 DIAGNOSIS — H524 Presbyopia: Secondary | ICD-10-CM | POA: Diagnosis not present

## 2022-11-15 DIAGNOSIS — Q141 Congenital malformation of retina: Secondary | ICD-10-CM | POA: Diagnosis not present

## 2022-12-28 DIAGNOSIS — S46011D Strain of muscle(s) and tendon(s) of the rotator cuff of right shoulder, subsequent encounter: Secondary | ICD-10-CM | POA: Diagnosis not present

## 2022-12-29 ENCOUNTER — Encounter: Payer: Self-pay | Admitting: Internal Medicine

## 2022-12-29 ENCOUNTER — Other Ambulatory Visit: Payer: Self-pay | Admitting: Orthopedic Surgery

## 2022-12-29 DIAGNOSIS — M25511 Pain in right shoulder: Secondary | ICD-10-CM

## 2022-12-30 ENCOUNTER — Other Ambulatory Visit: Payer: Self-pay | Admitting: Internal Medicine

## 2023-01-24 ENCOUNTER — Other Ambulatory Visit: Payer: PPO

## 2023-01-25 ENCOUNTER — Other Ambulatory Visit: Payer: Self-pay | Admitting: Internal Medicine

## 2023-01-27 ENCOUNTER — Ambulatory Visit: Payer: PPO | Admitting: Obstetrics and Gynecology

## 2023-01-27 ENCOUNTER — Encounter: Payer: Self-pay | Admitting: Obstetrics and Gynecology

## 2023-01-27 VITALS — BP 118/62 | HR 78 | Ht 59.84 in | Wt 175.0 lb

## 2023-01-27 DIAGNOSIS — Z01419 Encounter for gynecological examination (general) (routine) without abnormal findings: Secondary | ICD-10-CM

## 2023-01-27 DIAGNOSIS — N9089 Other specified noninflammatory disorders of vulva and perineum: Secondary | ICD-10-CM

## 2023-01-27 DIAGNOSIS — A630 Anogenital (venereal) warts: Secondary | ICD-10-CM | POA: Diagnosis not present

## 2023-01-27 DIAGNOSIS — Z9189 Other specified personal risk factors, not elsewhere classified: Secondary | ICD-10-CM

## 2023-01-27 MED ORDER — CLOBETASOL PROPIONATE 0.05 % EX OINT
1.0000 | TOPICAL_OINTMENT | Freq: Every day | CUTANEOUS | 0 refills | Status: AC
Start: 2023-01-27 — End: 2023-02-10

## 2023-01-27 NOTE — Patient Instructions (Signed)
For patients under 50-70yo, I recommend 1200mg  calcium daily and 600IU of vitamin D daily. For patients over 70yo, I recommend 1200mg  calcium daily and 800IU of vitamin D daily.  Health Maintenance, Female Adopting a healthy lifestyle and getting preventive care are important in promoting health and wellness. Ask your health care provider about: The right schedule for you to have regular tests and exams. Things you can do on your own to prevent diseases and keep yourself healthy. What should I know about diet, weight, and exercise? Eat a healthy diet  Eat a diet that includes plenty of vegetables, fruits, low-fat dairy products, and lean protein. Do not eat a lot of foods that are high in solid fats, added sugars, or sodium. Maintain a healthy weight Body mass index (BMI) is used to identify weight problems. It estimates body fat based on height and weight. Your health care provider can help determine your BMI and help you achieve or maintain a healthy weight. Get regular exercise Get regular exercise. This is one of the most important things you can do for your health. Most adults should: Exercise for at least 150 minutes each week. The exercise should increase your heart rate and make you sweat (moderate-intensity exercise). Do strengthening exercises at least twice a week. This is in addition to the moderate-intensity exercise. Spend less time sitting. Even light physical activity can be beneficial. Watch cholesterol and blood lipids Have your blood tested for lipids and cholesterol at 71 years of age, then have this test every 5 years. Have your cholesterol levels checked more often if: Your lipid or cholesterol levels are high. You are older than 71 years of age. You are at high risk for heart disease. What should I know about cancer screening? Depending on your health history and family history, you may need to have cancer screening at various ages. This may include screening  for: Breast cancer. Cervical cancer. Colorectal cancer. Skin cancer. Lung cancer. What should I know about heart disease, diabetes, and high blood pressure? Blood pressure and heart disease High blood pressure causes heart disease and increases the risk of stroke. This is more likely to develop in people who have high blood pressure readings or are overweight. Have your blood pressure checked: Every 3-5 years if you are 83-70 years of age. Every year if you are 4 years old or older. Diabetes Have regular diabetes screenings. This checks your fasting blood sugar level. Have the screening done: Once every three years after age 68 if you are at a normal weight and have a low risk for diabetes. More often and at a younger age if you are overweight or have a high risk for diabetes. What should I know about preventing infection? Hepatitis B If you have a higher risk for hepatitis B, you should be screened for this virus. Talk with your health care provider to find out if you are at risk for hepatitis B infection. Hepatitis C Testing is recommended for: Everyone born from [birth year] through 1963. Anyone with known risk factors for hepatitis C. Sexually transmitted infections (STIs) Get screened for STIs, including gonorrhea and chlamydia, if: You are sexually active and are younger than 71 years of age. You are older than 71 years of age and your health care provider tells you that you are at risk for this type of infection. Your sexual activity has changed since you were last screened, and you are at increased risk for chlamydia or gonorrhea. Ask your health care provider if  you are at risk. Ask your health care provider about whether you are at high risk for HIV. Your health care provider may recommend a prescription medicine to help prevent HIV infection. If you choose to take medicine to prevent HIV, you should first get tested for HIV. You should then be tested every 3 months for as long as you  are taking the medicine. Osteoporosis and menopause Osteoporosis is a disease in which the bones lose minerals and strength with aging. This can result in bone fractures. If you are 44 years old or older, or if you are at risk for osteoporosis and fractures, ask your health care provider if you should: Be screened for bone loss. Take a calcium or vitamin D supplement to lower your risk of fractures. Be given hormone replacement therapy (HRT) to treat symptoms of menopause. Follow these instructions at home: Alcohol use Do not drink alcohol if: Your health care provider tells you not to drink. You are pregnant, may be pregnant, or are planning to become pregnant. If you drink alcohol: Limit how much you have to: 0-1 drink a day. Know how much alcohol is in your drink. In the [place] one drink equals one 12 oz bottle of beer (355 mL), one 5 oz glass of wine (148 mL), or one 1 oz glass of hard liquor (44 mL). Lifestyle Do not use any products that contain nicotine or tobacco. These products include cigarettes, chewing tobacco, and vaping devices, such as e-cigarettes. If you need help quitting, ask your health care provider. Do not use street drugs. Do not share needles. Ask your health care provider for help if you need support or information about quitting drugs. General instructions Schedule regular health, dental, and eye exams. Stay current with your vaccines. Tell your health care provider if: You often feel depressed. You have ever been abused or do not feel safe at home. Summary Adopting a healthy lifestyle and getting preventive care are important in promoting health and wellness. Follow your health care provider's instructions about healthy diet, exercising, and getting tested or screened for diseases. Follow your health care provider's instructions on monitoring your cholesterol and blood pressure. This information is not intended to replace advice given to you by your health  care provider. Make sure you discuss any questions you have with your health care provider. Document Revised: 05/18/2020 Document Reviewed: 05/18/2020 Elsevier Patient Education  2024 [employer].
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
## 2023-02-07 ENCOUNTER — Other Ambulatory Visit: Payer: Self-pay | Admitting: Internal Medicine

## 2023-02-07 DIAGNOSIS — Z9189 Other specified personal risk factors, not elsewhere classified: Secondary | ICD-10-CM | POA: Insufficient documentation

## 2023-02-07 DIAGNOSIS — Z01419 Encounter for gynecological examination (general) (routine) without abnormal findings: Secondary | ICD-10-CM | POA: Insufficient documentation

## 2023-02-07 NOTE — Assessment & Plan Note (Signed)
Cervical cancer screening performed according to ASCCP guidelines. Encouraged annual mammogram screening Colonoscopy UTD DXA UTD: osteoporsis managed by PCP Labs and immunizations with her primary Encouraged safe sexual practices as indicated Encouraged healthy lifestyle practices with diet and exercise For patients under 50-70yo, I recommend 1200mg  calcium daily and 600IU of vitamin D daily.

## 2023-02-22 ENCOUNTER — Ambulatory Visit: Payer: PPO | Admitting: Obstetrics and Gynecology

## 2023-03-03 ENCOUNTER — Ambulatory Visit: Payer: PPO | Admitting: Obstetrics and Gynecology

## 2023-05-08 ENCOUNTER — Other Ambulatory Visit: Payer: Self-pay | Admitting: Internal Medicine

## 2023-05-18 DIAGNOSIS — H35362 Drusen (degenerative) of macula, left eye: Secondary | ICD-10-CM | POA: Diagnosis not present

## 2023-05-18 DIAGNOSIS — Q141 Congenital malformation of retina: Secondary | ICD-10-CM | POA: Diagnosis not present

## 2023-05-18 DIAGNOSIS — H25813 Combined forms of age-related cataract, bilateral: Secondary | ICD-10-CM | POA: Diagnosis not present

## 2023-05-18 DIAGNOSIS — H524 Presbyopia: Secondary | ICD-10-CM | POA: Diagnosis not present

## 2023-05-18 DIAGNOSIS — H40013 Open angle with borderline findings, low risk, bilateral: Secondary | ICD-10-CM | POA: Diagnosis not present

## 2023-05-18 DIAGNOSIS — H35371 Puckering of macula, right eye: Secondary | ICD-10-CM | POA: Diagnosis not present

## 2023-05-22 ENCOUNTER — Other Ambulatory Visit: Payer: Self-pay | Admitting: Internal Medicine

## 2023-05-23 ENCOUNTER — Other Ambulatory Visit: Payer: Self-pay

## 2023-05-23 ENCOUNTER — Other Ambulatory Visit (HOSPITAL_COMMUNITY): Payer: Self-pay

## 2023-05-23 ENCOUNTER — Telehealth: Payer: Self-pay

## 2023-05-23 NOTE — Telephone Encounter (Signed)
 Pharmacy Patient Advocate Encounter   Received notification from Patient Pharmacy that prior authorization for Cyclobenzaprine  10 is required/requested.   Insurance verification completed.   The patient is insured through [facility] [employer] .   Per test claim: PA required; PA submitted to above mentioned insurance via CoverMyMeds Key/confirmation #/EOC [id number] Status is pending

## 2023-05-23 NOTE — Telephone Encounter (Signed)
 Pharmacy Patient Advocate Encounter  Received notification from [employer] that Prior Authorization for Cyclobenzaprine  has been DENIED.  Full denial letter will be uploaded to the media tab. See denial reason below.   PA #/Case ID/Reference #: [id number]

## 2023-05-23 NOTE — Telephone Encounter (Signed)
Denial placed in folder for review.

## 2023-05-25 NOTE — Telephone Encounter (Signed)
 Please call her - does she want to try something different?

## 2023-05-28 MED ORDER — TIZANIDINE HCL 2 MG PO TABS: 2.0000 mg | ORAL_TABLET | Freq: Three times a day (TID) | ORAL | 2 refills | Status: DC | PRN

## 2023-05-28 NOTE — Addendum Note (Signed)
 Addended by: Colene Dauphin on: 05/28/2023 03:42 PM   Modules accepted: Orders

## 2023-05-31 ENCOUNTER — Ambulatory Visit (INDEPENDENT_AMBULATORY_CARE_PROVIDER_SITE_OTHER): Payer: PPO

## 2023-05-31 VITALS — Ht 60.0 in | Wt 175.0 lb

## 2023-05-31 DIAGNOSIS — Z0000 Encounter for general adult medical examination without abnormal findings: Secondary | ICD-10-CM | POA: Diagnosis not present
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
## 2023-05-31 NOTE — Patient Instructions (Addendum)
 Ms. Cid , Thank you for taking time out of your busy schedule to complete your Annual Wellness Visit with me. I enjoyed our conversation and look forward to speaking with you again next year. I, as well as your care team,  appreciate your ongoing commitment to your health goals. Please review the following plan we discussed and let me know if I can assist you in the future. Your Game plan/ To Do List    Referrals: If you haven't heard from the office you've been referred to, please reach out to them at the phone provided.  Mammogram appointment will be scheduled through [facility] for 08/2023 by the patient. Follow up Visits: Next Medicare AWV with our clinical staff: 05/31/2024   Have you seen your provider in the last 6 months (3 months if uncontrolled diabetes)? Yes Next Office Visit with your provider: 09/15/2023  Clinician Recommendations:  Aim for 30 minutes of exercise or brisk walking, 6-8 glasses of water, and 5 servings of fruits and vegetables each day. Patient will obtain an immunization record from [facility] pharmacy showing recent vaccines received for the PCP's records.      This is a list of the screening recommended for you and due dates:  Health Maintenance  Topic Date Due   COVID-19 Vaccine (4 - 2024-25 season) 09/11/2022   Flu Shot  08/11/2023   Mammogram  09/02/2023   DTaP/Tdap/Td vaccine (2 - Td or Tdap) 12/11/2023   Medicare Annual Wellness Visit  05/30/2024   DEXA scan (bone density measurement)  08/03/2024   Colon Cancer Screening  09/11/2024   Pneumonia Vaccine  Completed   Hepatitis C Screening  Completed   Zoster (Shingles) Vaccine  Completed   HPV Vaccine  Aged Out   Meningitis B Vaccine  Aged Out    Advanced directives: (Copy Requested) Please bring a copy of your health care power of attorney and living will to the office to be added to your chart at your convenience. You can mail to [facility] [address] [place], [place]  [address] or email to [email or web address] Advance Care Planning is important because it:  [x]  Makes sure you receive the medical care that is consistent with your values, goals, and preferences  [x]  It provides guidance to your family and loved ones and reduces their decisional burden about whether or not they are making the right decisions based on your wishes.  Follow the link provided in your after visit summary or read over the paperwork we have mailed to you to help you started getting your Advance Directives in place. If you need assistance in completing these, please reach out to us  so that we can help you!

## 2023-06-03 ENCOUNTER — Encounter: Payer: Self-pay | Admitting: Internal Medicine

## 2023-06-15 ENCOUNTER — Other Ambulatory Visit: Payer: Self-pay | Admitting: Internal Medicine

## 2023-07-10 ENCOUNTER — Encounter: Payer: Self-pay | Admitting: Internal Medicine

## 2023-07-10 NOTE — Telephone Encounter (Signed)
 Copied from CRM [id number]. Topic: Clinical - Medication Question >> Jul 10, 2023  2:55 PM Ernestene P wrote: Reason for CRM: Pt advise she reach out to health insurance and was advise If pt go through RX she could get prescription cheaper - pt would like new rx cyclobenzaprine  (FLEXERIL ) 10 MG tablet-

## 2023-07-11 MED ORDER — CYCLOBENZAPRINE HCL 10 MG PO TABS: 10.0000 mg | ORAL_TABLET | Freq: Three times a day (TID) | ORAL | 0 refills | Status: DC | PRN

## 2023-08-09 ENCOUNTER — Other Ambulatory Visit: Payer: Self-pay | Admitting: Internal Medicine

## 2023-08-09 DIAGNOSIS — L820 Inflamed seborrheic keratosis: Secondary | ICD-10-CM | POA: Diagnosis not present

## 2023-08-09 DIAGNOSIS — L918 Other hypertrophic disorders of the skin: Secondary | ICD-10-CM | POA: Diagnosis not present

## 2023-08-09 DIAGNOSIS — Z1283 Encounter for screening for malignant neoplasm of skin: Secondary | ICD-10-CM | POA: Diagnosis not present

## 2023-08-09 DIAGNOSIS — D225 Melanocytic nevi of trunk: Secondary | ICD-10-CM | POA: Diagnosis not present

## 2023-08-22 ENCOUNTER — Other Ambulatory Visit: Payer: Self-pay

## 2023-08-22 ENCOUNTER — Encounter: Payer: Self-pay | Admitting: Internal Medicine

## 2023-08-22 MED ORDER — ALENDRONATE SODIUM 70 MG PO TABS: 70.0000 mg | ORAL_TABLET | ORAL | 3 refills | Status: AC

## 2023-09-04 DIAGNOSIS — Z1231 Encounter for screening mammogram for malignant neoplasm of breast: Secondary | ICD-10-CM | POA: Diagnosis not present

## 2023-09-04 LAB — HM MAMMOGRAPHY

## 2023-09-14 ENCOUNTER — Encounter: Payer: Self-pay | Admitting: Internal Medicine

## 2023-09-14 NOTE — Patient Instructions (Addendum)

## 2023-09-14 NOTE — Progress Notes (Unsigned)
 Subjective:    Patient ID: Colleen Walsh, female    DOB: May 30, 1950, 73 y.o.   MRN: [id number]      HPI Julio is here for a Physical exam and her chronic medical problems.   R rotator cuff tear, tears in arm - seeing Dr Josefina - he recommended surgery.  Went to [facility] - treatment was 8000 -PRP treatments.  Her pain is much improved and she denies any at this time.  She is very happy about the treatment that she had there and happy that she did it.  Overall feels she is doing well.   Medications and allergies reviewed with patient and updated if appropriate.  Current Outpatient Medications on File Prior to Visit  Medication Sig Dispense Refill   alendronate  (FOSAMAX ) 70 MG tablet Take 1 tablet (70 mg total) by mouth every 7 (seven) days. Take with a full glass of water on an empty stomach. 12 tablet 3   amLODipine  (NORVASC ) 5 MG tablet Take 1 tablet by mouth once daily 90 tablet 0   Ascorbic Acid (VITAMIN C ) 1000 MG tablet Take 1 tablet (1,000 mg total) by mouth daily.     Calcium  Carb-Cholecalciferol  (CALCIUM +D3) 600-20 MG-MCG TABS Take 1 tablet by mouth in the morning and at bedtime.     cyclobenzaprine  (FLEXERIL ) 10 MG tablet Take 1 tablet (10 mg total) by mouth 3 (three) times daily as needed for muscle spasms. 90 tablet 0   pregabalin  (LYRICA ) 100 MG capsule Take 1 capsule by mouth at bedtime 90 capsule 0   No current facility-administered medications on file prior to visit.    Review of Systems  Constitutional:  Negative for fever.  Eyes:  Negative for visual disturbance.  Respiratory:  Negative for cough, shortness of breath and wheezing.   Cardiovascular:  Negative for chest pain, palpitations and leg swelling.  Gastrointestinal:  Positive for constipation. Negative for abdominal pain, blood in stool and diarrhea.       Gerd  Genitourinary:  Negative for dysuria.  Musculoskeletal:  Positive for back pain (occ muscle spasms). Negative for arthralgias.   Skin:  Negative for rash.  Neurological:  Negative for dizziness, light-headedness, numbness and headaches.  Psychiatric/Behavioral:  Negative for dysphoric mood. The patient is not nervous/anxious.        Objective:   Vitals:   09/15/23 1121  BP: 118/64  Pulse: 77  Temp: 98.6 F (37 C)  SpO2: 97%   Filed Weights   09/15/23 1121  Weight: 166 lb (75.3 kg)   Body mass index is 32.42 kg/m.  BP Readings from Last 3 Encounters:  09/15/23 118/64  01/27/23 118/62  09/13/22 108/72    Wt Readings from Last 3 Encounters:  09/15/23 166 lb (75.3 kg)  05/31/23 175 lb (79.4 kg)  01/27/23 175 lb (79.4 kg)       Physical Exam Constitutional: She appears well-developed and well-nourished. No distress.  HENT:  Head: Normocephalic and atraumatic.  Right Ear: External ear normal. Normal ear canal and TM Left Ear: External ear normal.  Normal ear canal and TM Mouth/Throat: Oropharynx is clear and moist.  Eyes: Conjunctivae normal.  Neck: Neck supple. No tracheal deviation present. No thyromegaly present.  No carotid bruit  Cardiovascular: Normal rate, regular rhythm and normal heart sounds.   No murmur heard.  No edema. Pulmonary/Chest: Effort normal and breath sounds normal. No respiratory distress. She has no wheezes. She has no rales.  Breast: deferred   Abdominal: Soft. She  exhibits no distension. There is no tenderness.  Lymphadenopathy: She has no cervical adenopathy.  Skin: Skin is warm and dry. She is not diaphoretic.  Psychiatric: She has a normal mood and affect. Her behavior is normal.     Lab Results  Component Value Date   WBC 6.0 09/13/2022   HGB 13.5 09/13/2022   HCT 40.5 09/13/2022   PLT 384.0 09/13/2022   GLUCOSE 83 09/13/2022   CHOL 217 (H) 09/13/2022   TRIG 175.0 (H) 09/13/2022   HDL 39.70 09/13/2022   LDLDIRECT 152.0 09/10/2021   LDLCALC 142 (H) 09/13/2022   ALT 10 09/13/2022   AST 12 09/13/2022   NA 135 09/13/2022   K 3.9 09/13/2022   CL 102  09/13/2022   CREATININE 0.69 09/13/2022   BUN 10 09/13/2022   CO2 25 09/13/2022   TSH 1.36 09/13/2022   HGBA1C 5.3 09/13/2022    The 10-year ASCVD risk score (Arnett DK, et al., 2019) is: 11.8%   Values used to calculate the score:     Age: 73 years     Clincally relevant sex: Female     Is Non-Hispanic African American: No     Diabetic: No     Tobacco smoker: No     Systolic Blood Pressure: 118 mmHg     Is BP treated: Yes     HDL Cholesterol: 39.7 mg/dL     Total Cholesterol: 217 mg/dL      Assessment & Plan:   Physical exam: Screening blood work  ordered Exercise  - none - encouraged regular exercise Weight  obese - working on weight loss - watching diet - has lost 16 lbs -- has 14 more lbs to do Substance abuse  none   Reviewed recommended immunizations.  Flu immunization administered today.     Health Maintenance  Topic Date Due   Influenza Vaccine  08/11/2023   COVID-19 Vaccine (4 - 2025-26 season) 10/01/2023 (Originally 09/11/2023)   DTaP/Tdap/Td (2 - Td or Tdap) 12/11/2023   Medicare Annual Wellness (AWV)  05/30/2024   DEXA SCAN  08/03/2024   MAMMOGRAM  09/03/2024   Colonoscopy  09/11/2024   Pneumococcal Vaccine: 50+ Years  Completed   Hepatitis C Screening  Completed   Zoster Vaccines- Shingrix  Completed   HPV VACCINES  Aged Out   Meningococcal B Vaccine  Aged Out   Hepatitis B Vaccines 19-59 Average Risk  Discontinued          See Problem List for Assessment and Plan of chronic medical problems.

## 2023-09-15 ENCOUNTER — Ambulatory Visit: Payer: PPO | Admitting: Internal Medicine

## 2023-09-15 VITALS — BP 118/64 | HR 77 | Temp 98.6°F | Ht 60.0 in | Wt 166.0 lb

## 2023-09-15 DIAGNOSIS — E66811 Obesity, class 1: Secondary | ICD-10-CM | POA: Diagnosis not present

## 2023-09-15 DIAGNOSIS — Z0000 Encounter for general adult medical examination without abnormal findings: Secondary | ICD-10-CM | POA: Diagnosis not present

## 2023-09-15 DIAGNOSIS — Z6832 Body mass index (BMI) 32.0-32.9, adult: Secondary | ICD-10-CM

## 2023-09-15 DIAGNOSIS — R739 Hyperglycemia, unspecified: Secondary | ICD-10-CM

## 2023-09-15 DIAGNOSIS — M810 Age-related osteoporosis without current pathological fracture: Secondary | ICD-10-CM

## 2023-09-15 DIAGNOSIS — Z23 Encounter for immunization: Secondary | ICD-10-CM | POA: Diagnosis not present

## 2023-09-15 DIAGNOSIS — E6609 Other obesity due to excess calories: Secondary | ICD-10-CM

## 2023-09-15 DIAGNOSIS — B0229 Other postherpetic nervous system involvement: Secondary | ICD-10-CM

## 2023-09-15 DIAGNOSIS — E782 Mixed hyperlipidemia: Secondary | ICD-10-CM

## 2023-09-15 DIAGNOSIS — M5489 Other dorsalgia: Secondary | ICD-10-CM

## 2023-09-15 DIAGNOSIS — I10 Essential (primary) hypertension: Secondary | ICD-10-CM

## 2023-09-15 LAB — COMPREHENSIVE METABOLIC PANEL WITH GFR
ALT: 9 U/L (ref 0–35)
AST: 13 U/L (ref 0–37)
Albumin: 4.2 g/dL (ref 3.5–5.2)
Alkaline Phosphatase: 50 U/L (ref 39–117)
BUN: 5 mg/dL — ABNORMAL LOW (ref 6–23)
CO2: 26 meq/L (ref 19–32)
Calcium: 10.2 mg/dL (ref 8.4–10.5)
Chloride: 99 meq/L (ref 96–112)
Creatinine, Ser: 0.73 mg/dL (ref 0.40–1.20)
GFR: 83.09 mL/min (ref >60.00)
Glucose, Bld: 84 mg/dL (ref 70–99)
Potassium: 4.4 meq/L (ref 3.5–5.1)
Sodium: 134 meq/L — ABNORMAL LOW (ref 135–145)
Total Bilirubin: 0.5 mg/dL (ref 0.2–1.2)
Total Protein: 7.6 g/dL (ref 6.0–8.3)

## 2023-09-15 LAB — CBC WITH DIFFERENTIAL/PLATELET
Basophils Absolute: 0.1 K/uL (ref 0.0–0.1)
Basophils Relative: 2 % (ref 0.0–3.0)
Eosinophils Absolute: 0.1 K/uL (ref 0.0–0.7)
Eosinophils Relative: 1.7 % (ref 0.0–5.0)
HCT: 40.8 % (ref 36.0–46.0)
Hemoglobin: 13.8 g/dL (ref 12.0–15.0)
Lymphocytes Relative: 41.4 % (ref 12.0–46.0)
Lymphs Abs: 2.1 K/uL (ref 0.7–4.0)
MCHC: 33.8 g/dL (ref 30.0–36.0)
MCV: 90 fl (ref 78.0–100.0)
Monocytes Absolute: 0.4 K/uL (ref 0.1–1.0)
Monocytes Relative: 7.8 % (ref 3.0–12.0)
Neutro Abs: 2.4 K/uL (ref 1.4–7.7)
Neutrophils Relative %: 47.1 % (ref 43.0–77.0)
Platelets: 393 K/uL (ref 150.0–400.0)
RBC: 4.53 Mil/uL (ref 3.87–5.11)
RDW: 14 % (ref 11.5–15.5)
WBC: 5.2 K/uL (ref 4.0–10.5)

## 2023-09-15 LAB — LIPID PANEL
Cholesterol: 212 mg/dL — ABNORMAL HIGH (ref 0–200)
HDL: 45.3 mg/dL (ref >39.00)
LDL Cholesterol: 131 mg/dL — ABNORMAL HIGH (ref 0–99)
NonHDL: 166.95
Total CHOL/HDL Ratio: 5
Triglycerides: 178 mg/dL — ABNORMAL HIGH (ref 0.0–149.0)
VLDL: 35.6 mg/dL (ref 0.0–40.0)

## 2023-09-15 LAB — VITAMIN D 25 HYDROXY (VIT D DEFICIENCY, FRACTURES): VITD: 34.19 ng/mL (ref 30.00–100.00)

## 2023-09-15 LAB — HEMOGLOBIN A1C: Hgb A1c MFr Bld: 5.3 % (ref 4.6–6.5)

## 2023-09-15 LAB — TSH: TSH: 0.88 u[IU]/mL (ref 0.35–5.50)

## 2023-09-15 NOTE — Addendum Note (Signed)
 Addended by: GEOFM GLADE PARAS on: 09/15/2023 12:46 PM   Modules accepted: Level of Service

## 2023-09-15 NOTE — Assessment & Plan Note (Addendum)
 Chronic Lab Results  Component Value Date   HGBA1C 5.3 09/13/2022   Check a1c Low sugar / carb diet Stressed regular exercise Continue weight loss efforts

## 2023-09-15 NOTE — Assessment & Plan Note (Signed)
Chronic Check lipid panel, cmp, TSH Continue lifestyle control Regular exercise and healthy diet encouraged

## 2023-09-15 NOTE — Assessment & Plan Note (Signed)
Chronic BP well controlled Continue amlodipine 5 mg daily cmp, CBC

## 2023-09-15 NOTE — Assessment & Plan Note (Signed)
 Chronic She has lost weight and will continue her efforts to lose weight She has been working on her diet Encouraged regular exercise

## 2023-09-15 NOTE — Assessment & Plan Note (Addendum)
 Chronic Left ear canal pain  Controlled Discussed if she thinks she still needed this medication as she does-on occasion she will experience some discomfort Continue lyrica  100 mg nightly

## 2023-09-15 NOTE — Assessment & Plan Note (Signed)
Chronic, intermittent Flexeril 5-10 mg up to 3 times daily as needed Advised heat, ice and topical medication such as Biofreeze Advised stretching gently, avoiding bending, lifting and twisting movements

## 2023-09-15 NOTE — Assessment & Plan Note (Signed)
 Chronic dexa up to date Stressed regular exercise Continue taking calcium  and vitamin d  Check vitamin D  level Continue Fosamax  70 mg weekly-started 09/2022 and plan to continue for 5 years DEXA due 07/2024

## 2023-09-16 ENCOUNTER — Ambulatory Visit: Payer: Self-pay | Admitting: Internal Medicine

## 2023-09-16 DIAGNOSIS — E782 Mixed hyperlipidemia: Secondary | ICD-10-CM

## 2023-09-18 MED ORDER — ROSUVASTATIN CALCIUM 5 MG PO TABS: 5.0000 mg | ORAL_TABLET | ORAL | 3 refills | Status: DC

## 2023-09-19 ENCOUNTER — Other Ambulatory Visit: Payer: Self-pay | Admitting: Internal Medicine

## 2023-09-25 ENCOUNTER — Encounter: Payer: Self-pay | Admitting: Internal Medicine

## 2023-11-06 ENCOUNTER — Other Ambulatory Visit: Payer: Self-pay | Admitting: Internal Medicine

## 2023-12-12 ENCOUNTER — Other Ambulatory Visit (HOSPITAL_COMMUNITY): Payer: Self-pay

## 2023-12-12 ENCOUNTER — Telehealth: Payer: Self-pay

## 2023-12-12 NOTE — Telephone Encounter (Signed)
 Pharmacy Patient Advocate Encounter   Received notification from Onbase that prior authorization for Cyclobenzaprine  10 tabs is required/requested.   Insurance verification completed.   The patient is insured through [facility] ADVANTAGE/RX ADVANCE.   Per test claim: PA required; PA submitted to above mentioned insurance via Latent Key/confirmation #/EOC [id number] Status is pending

## 2023-12-13 ENCOUNTER — Other Ambulatory Visit (HOSPITAL_COMMUNITY): Payer: Self-pay

## 2023-12-13 ENCOUNTER — Encounter: Payer: Self-pay | Admitting: Internal Medicine

## 2023-12-13 NOTE — Telephone Encounter (Signed)
 Pharmacy Patient Advocate Encounter  Received notification from [employer] that Prior Authorization for Cyclobenzaprine  10 tabs has been APPROVED from 12/12/23 to 01/09/24. Ran test claim, Copay is $2.40 for a 30 day supply. This test claim was processed through [facility]- copay amounts may vary at other pharmacies due to pharmacy/plan contracts, or as the patient moves through the different stages of their insurance plan.   PA #/Case ID/Reference #: # [id number]

## 2023-12-14 MED ORDER — CYCLOBENZAPRINE HCL 10 MG PO TABS: 10.0000 mg | ORAL_TABLET | Freq: Three times a day (TID) | ORAL | 1 refills | Status: AC | PRN

## 2023-12-26 ENCOUNTER — Encounter: Payer: Self-pay | Admitting: Orthopedic Surgery

## 2023-12-28 ENCOUNTER — Encounter: Payer: Self-pay | Admitting: Internal Medicine

## 2024-01-05 ENCOUNTER — Encounter: Payer: Self-pay | Admitting: Pharmacist

## 2024-01-05 NOTE — Progress Notes (Signed)
 Pharmacy Quality Measure Review  This patient is appearing on a report for being at risk of failing the adherence measure for cholesterol (statin) medications this calendar year.   Medication: Rosuvastatin  Last fill date: 12/05/23 for 39 day supply  Insurance report was not up to date. No action needed at this time.   Darrelyn Drum, PharmD, BCPS, CPP Clinical Pharmacist Practitioner [facility] [phone]

## 2024-01-16 ENCOUNTER — Encounter: Payer: Self-pay | Admitting: Obstetrics and Gynecology

## 2024-01-16 ENCOUNTER — Ambulatory Visit: Admitting: Obstetrics and Gynecology

## 2024-01-16 VITALS — BP 102/64 | HR 98 | Temp 98.2°F | Wt 170.0 lb

## 2024-01-16 DIAGNOSIS — L02215 Cutaneous abscess of perineum: Secondary | ICD-10-CM | POA: Diagnosis not present

## 2024-01-16 MED ORDER — SULFAMETHOXAZOLE-TRIMETHOPRIM 800-160 MG PO TABS: 1.0000 | ORAL_TABLET | Freq: Two times a day (BID) | ORAL | 0 refills | Status: AC

## 2024-01-16 NOTE — Progress Notes (Signed)
 "  74 y.o. G33P0020 female with osteoporosis (on Fosamax , managed by her PCP)  here for problem visit. Married.  No LMP recorded. Patient is postmenopausal.   She reports painful cyst on left side of leg started last Tuesday and bursted on Friday.  No longer draining however it is still tender.  She is keeping it covered with a bandaid. .   OB History  Gravida Para Term Preterm AB Living  2 0   2   SAB IAB Ectopic Multiple Live Births  0 2 0      # Outcome Date GA Lbr Len/2nd Weight Sex Type Anes PTL Lv  2 IAB           1 IAB            Past Medical History:  Diagnosis Date   Arthritis    Bulging of cervical intervertebral disc    2 discs   CONTACT DERMATITIS&OTHER ECZEMA DUE UNSPEC CAUSE    DEGENERATIVE JOINT DISEASE, CERVICAL SPINE    DYSLIPIDEMIA    GERD    GLUCOSE INTOLERANCE, HX OF    HYPERTENSION    Pinched nerve in neck    Tear of LCL (lateral collateral ligament) of knee 08/2014   R knee pain, dx on US , s/p IAC injection   VENEREAL WART    Past Surgical History:  Procedure Laterality Date   30 HOUR PH STUDY N/A 09/28/2015   Procedure: 24 HOUR PH STUDY;  Surgeon: Victory LITTIE Legrand DOUGLAS, MD;  Location: [facility];  Service: Gastroenterology;  Laterality: N/A;   CHOLECYSTECTOMY  1992   ESOPHAGEAL MANOMETRY N/A 09/28/2015   Procedure: ESOPHAGEAL MANOMETRY (EM);  Surgeon: Victory LITTIE Legrand DOUGLAS, MD;  Location: [facility];  Service: Gastroenterology;  Laterality: N/A;  IMPEADANCE    FOOT FRACTURE SURGERY Left 2006   MOHS SURGERY  2001   forehead-basal cell   swallowing test     Medications Ordered Prior to Encounter[1] Allergies[2]    PE Today's Vitals   01/16/24 0907  BP: 102/64  Pulse: 98  Temp: 98.2 F (36.8 C)  TempSrc: Oral  SpO2: 99%  Weight: 170 lb (77.1 kg)   Body mass index is 33.2 kg/m.  Physical Exam Vitals reviewed. Exam conducted with a chaperone present.  Constitutional:      General: She is not in acute distress.    Appearance: Normal  appearance.  HENT:     Head: Normocephalic and atraumatic.     Nose: Nose normal.  Eyes:     Extraocular Movements: Extraocular movements intact.     Conjunctiva/sclera: Conjunctivae normal.  Pulmonary:     Effort: Pulmonary effort is normal.  Genitourinary:     Comments: 3x4cm area of induration, redness limited to open draining area, no fluctuance, +tenderness, did not extend into the vagina Musculoskeletal:        General: Normal range of motion.     Cervical back: Normal range of motion.  Neurological:     General: No focal deficit present.     Mental Status: She is alert.  Psychiatric:        Mood and Affect: Mood normal.        Behavior: Behavior normal.       Assessment and Plan:        Perineal abscess, superficial -     Sulfamethoxazole -Trimethoprim ; Take 1 tablet by mouth 2 (two) times daily for 7 days.  Dispense: 14 tablet; Refill: 0  Recommend course of abx to ensure  resolution F/u in 1wk if area remains tender or hard  Keep annual exam 02/01/24   Vera LULLA Pa, MD     [1]  Current Outpatient Medications on File Prior to Visit  Medication Sig Dispense Refill   alendronate  (FOSAMAX ) 70 MG tablet Take 1 tablet (70 mg total) by mouth every 7 (seven) days. Take with a full glass of water on an empty stomach. 12 tablet 3   amLODipine  (NORVASC ) 5 MG tablet Take 1 tablet by mouth once daily 90 tablet 0   Ascorbic Acid (VITAMIN C ) 1000 MG tablet Take 1 tablet (1,000 mg total) by mouth daily.     Calcium  Carb-Cholecalciferol  (CALCIUM +D3) 600-20 MG-MCG TABS Take 1 tablet by mouth in the morning and at bedtime.     cyclobenzaprine  (FLEXERIL ) 10 MG tablet Take 1 tablet (10 mg total) by mouth 3 (three) times daily as needed for muscle spasms. 90 tablet 1   pregabalin  (LYRICA ) 100 MG capsule Take 1 capsule by mouth at bedtime 90 capsule 0   PREVIDENT 5000 BOOSTER PLUS 1.1 % PSTE 3 (three) times daily.     rosuvastatin  (CRESTOR ) 5 MG tablet TAKE 1 TABLET BY MOUTH THREE  TIMES A WEEK 39 tablet 3   No current facility-administered medications on file prior to visit.  [2]  Allergies Allergen Reactions   Augmentin  [Amoxicillin -Pot Clavulanate]     vomiting   Mobic  [Meloxicam ] Other (See Comments)    Flares up her GERD   Valtrex [Valacyclovir Hcl]     Vomiting    Dexlansoprazole     Raises BP   Doxycycline     REACTION: rash on hand   Ergostat [Ergotamine Tartrate]    Erythromycin     Stomach irritation    Hydrocodone -Acetaminophen  Nausea And Vomiting   Influenza Vaccines Other (See Comments)    Patient recalls that the reaction was myalgias and fever. This occurred in 2009 and pt has not taken a flu vaccine since.    Lisinopril     Subtherapeutic   Omeprazole     REACTION: nausea   Pantoprazole Sodium     Subtherapeutic   Phentermine     Raises BP   Prilosec [Omeprazole Magnesium ]    Propranolol     Betachron   Ranitidine      Subtherapeutic   Statins Other (See Comments)    Causes myalgias   Verapamil     REACTION: nausea   "

## 2024-02-01 ENCOUNTER — Ambulatory Visit: Admitting: Obstetrics and Gynecology

## 2024-02-01 ENCOUNTER — Encounter: Payer: Self-pay | Admitting: Obstetrics and Gynecology

## 2024-02-01 VITALS — BP 120/64 | HR 82 | Temp 97.9°F | Ht 60.5 in | Wt 169.0 lb

## 2024-02-01 DIAGNOSIS — A630 Anogenital (venereal) warts: Secondary | ICD-10-CM | POA: Diagnosis not present

## 2024-02-01 DIAGNOSIS — Z9189 Other specified personal risk factors, not elsewhere classified: Secondary | ICD-10-CM

## 2024-02-01 DIAGNOSIS — Z1331 Encounter for screening for depression: Secondary | ICD-10-CM | POA: Diagnosis not present

## 2024-02-01 DIAGNOSIS — L02215 Cutaneous abscess of perineum: Secondary | ICD-10-CM

## 2024-02-01 NOTE — Assessment & Plan Note (Signed)
 Cervical cancer screening discontinued at age 74yo Encouraged annual mammogram screening Colonoscopy UTD DXA UTD: osteoporsis managed by PCP Labs and immunizations with her primary Encouraged safe sexual practices as indicated Encouraged healthy lifestyle practices with diet and exercise For patients under 50-70yo, I recommend 1200mg  calcium  daily and 600IU of vitamin D  daily.

## 2024-02-01 NOTE — Patient Instructions (Signed)
 For patients under 50-72yo, I recommend 1200mg  calcium  daily and 600IU of vitamin D daily. For patients over 72yo, I recommend 1200mg  calcium  daily and 800IU of vitamin D daily.  Health Maintenance, Female Adopting a healthy lifestyle and getting preventive care are important in promoting health and wellness. Ask your health care provider about: The right schedule for you to have regular tests and exams. Things you can do on your own to prevent diseases and keep yourself healthy. What should I know about diet, weight, and exercise? Eat a healthy diet  Eat a diet that includes plenty of vegetables, fruits, low-fat dairy products, and lean protein. Do not eat a lot of foods that are high in solid fats, added sugars, or sodium. Maintain a healthy weight Body mass index (BMI) is used to identify weight problems. It estimates body fat based on height and weight. Your health care provider can help determine your BMI and help you achieve or maintain a healthy weight. Get regular exercise Get regular exercise. This is one of the most important things you can do for your health. Most adults should: Exercise for at least 150 minutes each week. The exercise should increase your heart rate and make you sweat (moderate-intensity exercise). Do strengthening exercises at least twice a week. This is in addition to the moderate-intensity exercise. Spend less time sitting. Even light physical activity can be beneficial. Watch cholesterol and blood lipids Have your blood tested for lipids and cholesterol at 72 years of age, then have this test every 5 years. Have your cholesterol levels checked more often if: Your lipid or cholesterol levels are high. You are older than 72 years of age. You are at high risk for heart disease. What should I know about cancer screening? Depending on your health history and family history, you may need to have cancer screening at various ages. This may include screening  for: Breast cancer. Cervical cancer. Colorectal cancer. Skin cancer. Lung cancer. What should I know about heart disease, diabetes, and high blood pressure? Blood pressure and heart disease High blood pressure causes heart disease and increases the risk of stroke. This is more likely to develop in people who have high blood pressure readings or are overweight. Have your blood pressure checked: Every 3-5 years if you are 25-57 years of age. Every year if you are 24 years old or older. Diabetes Have regular diabetes screenings. This checks your fasting blood sugar level. Have the screening done: Once every three years after age 62 if you are at a normal weight and have a low risk for diabetes. More often and at a younger age if you are overweight or have a high risk for diabetes. What should I know about preventing infection? Hepatitis B If you have a higher risk for hepatitis B, you should be screened for this virus. Talk with your health care provider to find out if you are at risk for hepatitis B infection. Hepatitis C Testing is recommended for: Everyone born from [birth year] through 1965. Anyone with known risk factors for hepatitis C. Sexually transmitted infections (STIs) Get screened for STIs, including gonorrhea and chlamydia, if: You are sexually active and are younger than 72 years of age. You are older than 72 years of age and your health care provider tells you that you are at risk for this type of infection. Your sexual activity has changed since you were last screened, and you are at increased risk for chlamydia or gonorrhea. Ask your health care provider if  you are at risk. Ask your health care provider about whether you are at high risk for HIV. Your health care provider may recommend a prescription medicine to help prevent HIV infection. If you choose to take medicine to prevent HIV, you should first get tested for HIV. You should then be tested every 3 months for as long as you  are taking the medicine. Osteoporosis and menopause Osteoporosis is a disease in which the bones lose minerals and strength with aging. This can result in bone fractures. If you are 72 years old or older, or if you are at risk for osteoporosis and fractures, ask your health care provider if you should: Be screened for bone loss. Take a calcium  or vitamin D supplement to lower your risk of fractures. Be given hormone replacement therapy (HRT) to treat symptoms of menopause. Follow these instructions at home: Alcohol use Do not drink alcohol if: Your health care provider tells you not to drink. You are pregnant, may be pregnant, or are planning to become pregnant. If you drink alcohol: Limit how much you have to: 0-1 drink a day. Know how much alcohol is in your drink. In the [place] one drink equals one 12 oz bottle of beer (355 mL), one 5 oz glass of wine (148 mL), or one 1 oz glass of hard liquor (44 mL). Lifestyle Do not use any products that contain nicotine or tobacco. These products include cigarettes, chewing tobacco, and vaping devices, such as e-cigarettes. If you need help quitting, ask your health care provider. Do not use street drugs. Do not share needles. Ask your health care provider for help if you need support or information about quitting drugs. General instructions Schedule regular health, dental, and eye exams. Stay current with your vaccines. Tell your health care provider if: You often feel depressed. You have ever been abused or do not feel safe at home. Summary Adopting a healthy lifestyle and getting preventive care are important in promoting health and wellness. Follow your health care provider's instructions about healthy diet, exercising, and getting tested or screened for diseases. Follow your health care provider's instructions on monitoring your cholesterol and blood pressure. This information is not intended to replace advice given to you by your health  care provider. Make sure you discuss any questions you have with your health care provider. Document Revised: 05/18/2020 Document Reviewed: 05/18/2020 Elsevier Patient Education  2024 [employer].
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
## 2024-02-06 ENCOUNTER — Other Ambulatory Visit: Payer: Self-pay | Admitting: Internal Medicine

## 2024-05-31 ENCOUNTER — Ambulatory Visit

## 2024-09-17 ENCOUNTER — Encounter: Admitting: Internal Medicine
